# Patient Record
Sex: Female | Born: 1945 | Race: Black or African American | Hispanic: No | Marital: Married | Smoking: Former smoker
Health system: Southern US, Community
[De-identification: ages and names within clinical notes are randomized; demographics above are authoritative.]

## PROBLEM LIST (undated history)

## (undated) DIAGNOSIS — Z923 Personal history of irradiation: Secondary | ICD-10-CM

## (undated) DIAGNOSIS — Z9221 Personal history of antineoplastic chemotherapy: Secondary | ICD-10-CM

## (undated) DIAGNOSIS — E785 Hyperlipidemia, unspecified: Secondary | ICD-10-CM

## (undated) DIAGNOSIS — R7303 Prediabetes: Secondary | ICD-10-CM

## (undated) DIAGNOSIS — K635 Polyp of colon: Secondary | ICD-10-CM

## (undated) DIAGNOSIS — I10 Essential (primary) hypertension: Secondary | ICD-10-CM

## (undated) DIAGNOSIS — C801 Malignant (primary) neoplasm, unspecified: Secondary | ICD-10-CM

## (undated) HISTORY — PX: OOPHORECTOMY: SHX86

## (undated) HISTORY — PX: APPENDECTOMY: SHX54

## (undated) HISTORY — DX: Polyp of colon: K63.5

## (undated) HISTORY — DX: Hyperlipidemia, unspecified: E78.5

## (undated) HISTORY — PX: TUBAL LIGATION: SHX77

## (undated) HISTORY — DX: Essential (primary) hypertension: I10

## (undated) HISTORY — DX: Malignant (primary) neoplasm, unspecified: C80.1

## (undated) HISTORY — PX: CHOLECYSTECTOMY: SHX55

---

## 1996-11-22 HISTORY — PX: ABDOMINAL HYSTERECTOMY: SHX81

## 1999-11-23 DIAGNOSIS — C801 Malignant (primary) neoplasm, unspecified: Secondary | ICD-10-CM

## 1999-11-23 HISTORY — DX: Malignant (primary) neoplasm, unspecified: C80.1

## 1999-11-23 HISTORY — PX: MASTECTOMY: SHX3

## 2000-11-22 HISTORY — PX: BREAST SURGERY: SHX581

## 2004-08-27 ENCOUNTER — Ambulatory Visit: Payer: Self-pay | Admitting: Oncology

## 2004-09-22 ENCOUNTER — Ambulatory Visit: Payer: Self-pay | Admitting: Oncology

## 2005-02-25 ENCOUNTER — Ambulatory Visit: Payer: Self-pay | Admitting: Oncology

## 2005-03-22 ENCOUNTER — Ambulatory Visit: Payer: Self-pay | Admitting: Oncology

## 2005-08-11 ENCOUNTER — Ambulatory Visit: Payer: Self-pay

## 2005-08-26 ENCOUNTER — Ambulatory Visit: Payer: Self-pay | Admitting: Oncology

## 2005-09-22 ENCOUNTER — Ambulatory Visit: Payer: Self-pay | Admitting: Oncology

## 2006-02-24 ENCOUNTER — Ambulatory Visit: Payer: Self-pay | Admitting: Oncology

## 2006-03-22 ENCOUNTER — Ambulatory Visit: Payer: Self-pay | Admitting: Oncology

## 2006-08-03 ENCOUNTER — Ambulatory Visit: Payer: Self-pay

## 2006-08-23 ENCOUNTER — Ambulatory Visit: Payer: Self-pay | Admitting: Oncology

## 2006-09-22 ENCOUNTER — Ambulatory Visit: Payer: Self-pay | Admitting: Oncology

## 2007-02-14 ENCOUNTER — Ambulatory Visit: Payer: Self-pay | Admitting: Oncology

## 2007-02-14 ENCOUNTER — Ambulatory Visit: Payer: Self-pay | Admitting: Internal Medicine

## 2007-02-21 ENCOUNTER — Ambulatory Visit: Payer: Self-pay | Admitting: Oncology

## 2007-02-21 ENCOUNTER — Ambulatory Visit: Payer: Self-pay | Admitting: Internal Medicine

## 2007-07-24 ENCOUNTER — Ambulatory Visit: Payer: Self-pay | Admitting: Oncology

## 2007-08-15 ENCOUNTER — Ambulatory Visit: Payer: Self-pay | Admitting: Oncology

## 2007-08-23 ENCOUNTER — Ambulatory Visit: Payer: Self-pay | Admitting: Oncology

## 2007-10-11 ENCOUNTER — Ambulatory Visit: Payer: Self-pay

## 2008-01-21 ENCOUNTER — Ambulatory Visit: Payer: Self-pay | Admitting: Oncology

## 2008-02-21 ENCOUNTER — Ambulatory Visit: Payer: Self-pay | Admitting: Oncology

## 2008-08-27 ENCOUNTER — Ambulatory Visit: Payer: Self-pay

## 2008-09-03 ENCOUNTER — Ambulatory Visit: Payer: Self-pay | Admitting: Oncology

## 2008-09-22 ENCOUNTER — Ambulatory Visit: Payer: Self-pay | Admitting: Oncology

## 2009-02-20 ENCOUNTER — Ambulatory Visit: Payer: Self-pay | Admitting: Oncology

## 2009-03-04 ENCOUNTER — Ambulatory Visit: Payer: Self-pay | Admitting: Oncology

## 2009-03-22 ENCOUNTER — Ambulatory Visit: Payer: Self-pay | Admitting: Oncology

## 2009-08-22 ENCOUNTER — Ambulatory Visit: Payer: Self-pay | Admitting: Oncology

## 2009-08-27 ENCOUNTER — Ambulatory Visit: Payer: Self-pay

## 2009-09-02 ENCOUNTER — Ambulatory Visit: Payer: Self-pay | Admitting: Oncology

## 2009-09-22 ENCOUNTER — Ambulatory Visit: Payer: Self-pay | Admitting: Oncology

## 2010-02-07 ENCOUNTER — Emergency Department: Payer: Self-pay | Admitting: Emergency Medicine

## 2010-02-20 ENCOUNTER — Ambulatory Visit: Payer: Self-pay | Admitting: Oncology

## 2010-03-04 ENCOUNTER — Ambulatory Visit: Payer: Self-pay | Admitting: Oncology

## 2010-03-22 ENCOUNTER — Ambulatory Visit: Payer: Self-pay | Admitting: Oncology

## 2010-04-10 LAB — HM PAP SMEAR: HM Pap smear: NORMAL

## 2010-06-04 ENCOUNTER — Emergency Department: Payer: Self-pay | Admitting: Emergency Medicine

## 2010-09-02 ENCOUNTER — Ambulatory Visit: Payer: Self-pay | Admitting: Oncology

## 2010-09-08 ENCOUNTER — Ambulatory Visit: Payer: Self-pay | Admitting: Oncology

## 2010-09-22 ENCOUNTER — Ambulatory Visit: Payer: Self-pay | Admitting: Oncology

## 2011-03-10 ENCOUNTER — Ambulatory Visit: Payer: Self-pay | Admitting: Oncology

## 2011-03-23 ENCOUNTER — Ambulatory Visit: Payer: Self-pay | Admitting: Oncology

## 2011-07-28 ENCOUNTER — Emergency Department: Payer: Self-pay

## 2011-09-08 ENCOUNTER — Ambulatory Visit: Payer: Self-pay | Admitting: Oncology

## 2011-09-13 ENCOUNTER — Ambulatory Visit: Payer: Self-pay | Admitting: Oncology

## 2011-09-14 LAB — CANCER ANTIGEN 27.29: CA 27.29: 25.1 U/mL (ref 0.0–38.6)

## 2011-09-23 ENCOUNTER — Ambulatory Visit: Payer: Self-pay | Admitting: Oncology

## 2011-10-15 ENCOUNTER — Emergency Department: Payer: Self-pay | Admitting: Emergency Medicine

## 2011-10-17 ENCOUNTER — Emergency Department: Payer: Self-pay | Admitting: Emergency Medicine

## 2011-10-19 ENCOUNTER — Ambulatory Visit: Payer: Self-pay | Admitting: Anesthesiology

## 2011-10-19 DIAGNOSIS — I10 Essential (primary) hypertension: Secondary | ICD-10-CM

## 2011-10-25 ENCOUNTER — Ambulatory Visit: Payer: Self-pay | Admitting: Internal Medicine

## 2011-10-27 ENCOUNTER — Ambulatory Visit: Payer: Self-pay | Admitting: Otolaryngology

## 2011-10-29 LAB — PATHOLOGY REPORT

## 2011-11-05 ENCOUNTER — Ambulatory Visit: Payer: Self-pay | Admitting: Unknown Physician Specialty

## 2012-03-13 ENCOUNTER — Ambulatory Visit: Payer: Self-pay | Admitting: Oncology

## 2012-03-22 ENCOUNTER — Ambulatory Visit: Payer: Self-pay | Admitting: Oncology

## 2012-09-10 LAB — HM MAMMOGRAPHY: HM Mammogram: NORMAL

## 2012-09-11 ENCOUNTER — Ambulatory Visit: Payer: Self-pay

## 2012-11-04 ENCOUNTER — Emergency Department: Payer: Self-pay | Admitting: Emergency Medicine

## 2012-11-10 LAB — HM COLONOSCOPY

## 2013-04-10 ENCOUNTER — Encounter: Payer: Self-pay | Admitting: Internal Medicine

## 2013-04-10 ENCOUNTER — Ambulatory Visit (INDEPENDENT_AMBULATORY_CARE_PROVIDER_SITE_OTHER): Payer: Medicare Other | Admitting: Internal Medicine

## 2013-04-10 VITALS — BP 120/74 | HR 97 | Temp 98.1°F | Ht 65.25 in | Wt 198.0 lb

## 2013-04-10 DIAGNOSIS — Z853 Personal history of malignant neoplasm of breast: Secondary | ICD-10-CM | POA: Insufficient documentation

## 2013-04-10 DIAGNOSIS — E785 Hyperlipidemia, unspecified: Secondary | ICD-10-CM | POA: Insufficient documentation

## 2013-04-10 DIAGNOSIS — I10 Essential (primary) hypertension: Secondary | ICD-10-CM | POA: Insufficient documentation

## 2013-04-10 DIAGNOSIS — Z23 Encounter for immunization: Secondary | ICD-10-CM

## 2013-04-10 DIAGNOSIS — E039 Hypothyroidism, unspecified: Secondary | ICD-10-CM | POA: Insufficient documentation

## 2013-04-10 LAB — LIPID PANEL
HDL: 52.7 mg/dL (ref >39.00)
LDL Cholesterol: 81 mg/dL (ref 0–99)
Total CHOL/HDL Ratio: 3
Triglycerides: 100 mg/dL (ref 0.0–149.0)

## 2013-04-10 LAB — COMPREHENSIVE METABOLIC PANEL
ALT: 20 U/L (ref 0–35)
AST: 21 U/L (ref 0–37)
Albumin: 4.4 g/dL (ref 3.5–5.2)
Alkaline Phosphatase: 75 U/L (ref 39–117)
BUN: 23 mg/dL (ref 6–23)
Creatinine, Ser: 0.9 mg/dL (ref 0.4–1.2)
Potassium: 3.3 mEq/L — ABNORMAL LOW (ref 3.5–5.1)

## 2013-04-10 LAB — POCT URINALYSIS DIPSTICK
Glucose, UA: NEGATIVE
Nitrite, UA: NEGATIVE
Urobilinogen, UA: 0.2

## 2013-04-10 LAB — CBC WITH DIFFERENTIAL/PLATELET
Basophils Absolute: 0 10*3/uL (ref 0.0–0.1)
Basophils Relative: 0.6 % (ref 0.0–3.0)
Eosinophils Absolute: 0.2 10*3/uL (ref 0.0–0.7)
Lymphocytes Relative: 37.6 % (ref 12.0–46.0)
MCHC: 33.7 g/dL (ref 30.0–36.0)
Monocytes Relative: 6.1 % (ref 3.0–12.0)
Neutrophils Relative %: 53.6 % (ref 43.0–77.0)
RBC: 5.09 Mil/uL (ref 3.87–5.11)

## 2013-04-10 LAB — TSH: TSH: 3.29 u[IU]/mL (ref 0.35–5.50)

## 2013-04-10 NOTE — Progress Notes (Signed)
Subjective:    Patient ID: Michelle Clark, female    DOB: 09/22/1946, 67 y.o.   MRN: [id number]  HPI 67 year old female with history of hypertension, hyperlipidemia, breast cancer status post mastectomy, chemotherapy, and radiation therapy presents to establish care. She reports she was diagnosed with right-sided breast cancer in 2001. She was treated with chemotherapy and radiation as well as mastectomy. She has been cancer free since that time. She is generally done well. She also has a history of hypertension for which she takes Maxzide and furosemide with improvement. She was recently seen by a cardiologist and underwent stress test which she reports was normal. She also has a history of high cholesterol for which he takes simvastatin. She denies any side effects from this medication.  Outpatient Encounter Prescriptions as of 04/10/2013  Medication Sig Dispense Refill  . aspirin 81 MG tablet Take 81 mg by mouth daily.      . furosemide (LASIX) 40 MG tablet Take 40 mg by mouth daily as needed.       . Multiple Vitamin (MULTIVITAMIN) tablet Take 1 tablet by mouth daily.      . naproxen sodium (ANAPROX) 220 MG tablet Take 220 mg by mouth 2 (two) times daily with a meal.      . simvastatin (ZOCOR) 20 MG tablet Take 20 mg by mouth daily.       Marland Kitchen triamterene-hydrochlorothiazide (MAXZIDE) 75-50 MG per tablet Take 1 tablet by mouth daily.       No facility-administered encounter medications on file as of 04/10/2013.   BP 120/74  Pulse 97  Temp(Src) 98.1 F (36.7 C) (Oral)  Ht 5' 5.25" (1.657 m)  Wt 198 lb (89.812 kg)  BMI 32.71 kg/m2  SpO2 98%  Review of Systems  Constitutional: Negative for fever, chills, appetite change, fatigue and unexpected weight change.  HENT: Negative for ear pain, congestion, sore throat, trouble swallowing, neck pain, voice change and sinus pressure.   Eyes: Negative for visual disturbance.  Respiratory: Negative for cough, shortness of breath, wheezing and stridor.    Cardiovascular: Negative for chest pain, palpitations and leg swelling.  Gastrointestinal: Negative for nausea, vomiting, abdominal pain, diarrhea, constipation, blood in stool, abdominal distention and anal bleeding.  Genitourinary: Negative for dysuria and flank pain.  Musculoskeletal: Negative for myalgias, arthralgias and gait problem.  Skin: Negative for color change and rash.  Neurological: Negative for dizziness and headaches.  Hematological: Negative for adenopathy. Does not bruise/bleed easily.  Psychiatric/Behavioral: Negative for suicidal ideas, sleep disturbance and dysphoric mood. The patient is not nervous/anxious.        Objective:   Physical Exam  Constitutional: She is oriented to person, place, and time. She appears well-developed and well-nourished. No distress.  HENT:  Head: Normocephalic and atraumatic.  Right Ear: External ear normal.  Left Ear: External ear normal.  Nose: Nose normal.  Mouth/Throat: Oropharynx is clear and moist. No oropharyngeal exudate.  Eyes: Conjunctivae are normal. Pupils are equal, round, and reactive to light. Right eye exhibits no discharge. Left eye exhibits no discharge. No scleral icterus.  Neck: Normal range of motion. Neck supple. No tracheal deviation present. No thyromegaly present.  Cardiovascular: Normal rate, regular rhythm, normal heart sounds and intact distal pulses.  Exam reveals no gallop and no friction rub.   No murmur heard. Pulmonary/Chest: Effort normal and breath sounds normal. No respiratory distress. She has no wheezes. She has no rales. She exhibits no tenderness.  Abdominal: Soft. Bowel sounds are normal. She exhibits no  distension. There is no tenderness.  Musculoskeletal: Normal range of motion. She exhibits no edema and no tenderness.  Lymphadenopathy:    She has no cervical adenopathy.  Neurological: She is alert and oriented to person, place, and time. No cranial nerve deficit. She exhibits normal muscle  tone. Coordination normal.  Skin: Skin is warm and dry. No rash noted. She is not diaphoretic. No erythema. No pallor.  Psychiatric: She has a normal mood and affect. Her behavior is normal. Judgment and thought content normal.          Assessment & Plan:

## 2013-04-10 NOTE — Assessment & Plan Note (Signed)
BP Readings from Last 3 Encounters:  04/10/13 120/74   BP well controlled on current medications. Will continue.

## 2013-04-10 NOTE — Assessment & Plan Note (Signed)
Lipids well controlled on Simvastatin. Will continue. 

## 2013-04-10 NOTE — Assessment & Plan Note (Signed)
Will request records on previous breast cancer from [facility].

## 2013-08-31 ENCOUNTER — Telehealth: Payer: Self-pay | Admitting: *Deleted

## 2013-08-31 DIAGNOSIS — Z1239 Encounter for other screening for malignant neoplasm of breast: Secondary | ICD-10-CM

## 2013-08-31 NOTE — Telephone Encounter (Signed)
Patient left a message requesting we call to schedule her mammogram. Called patient and informed her now she could call to schedule her own mammogram. However patient is a previous cancer patient, she has had breast cancer and her last mammogram was 08/2012. Could you please set this up for her, she uses [facility].

## 2013-09-03 NOTE — Telephone Encounter (Signed)
Dr. Dan Humphreys, will you place a order for this mammogram.

## 2013-09-13 ENCOUNTER — Ambulatory Visit: Payer: Self-pay | Admitting: Internal Medicine

## 2013-09-13 LAB — HM MAMMOGRAPHY: HM Mammogram: NORMAL

## 2013-09-27 ENCOUNTER — Other Ambulatory Visit: Payer: Self-pay

## 2013-10-02 ENCOUNTER — Encounter: Payer: Self-pay | Admitting: Internal Medicine

## 2013-10-15 ENCOUNTER — Encounter: Payer: Self-pay | Admitting: Internal Medicine

## 2013-10-15 ENCOUNTER — Ambulatory Visit (INDEPENDENT_AMBULATORY_CARE_PROVIDER_SITE_OTHER): Payer: Medicare HMO | Admitting: Internal Medicine

## 2013-10-15 VITALS — BP 128/78 | HR 97 | Temp 97.7°F | Resp 12 | Ht 66.5 in | Wt 201.0 lb

## 2013-10-15 DIAGNOSIS — Z23 Encounter for immunization: Secondary | ICD-10-CM

## 2013-10-15 DIAGNOSIS — Z0000 Encounter for general adult medical examination without abnormal findings: Secondary | ICD-10-CM

## 2013-10-15 DIAGNOSIS — Z01419 Encounter for gynecological examination (general) (routine) without abnormal findings: Secondary | ICD-10-CM

## 2013-10-15 DIAGNOSIS — I10 Essential (primary) hypertension: Secondary | ICD-10-CM

## 2013-10-15 DIAGNOSIS — E785 Hyperlipidemia, unspecified: Secondary | ICD-10-CM

## 2013-10-15 DIAGNOSIS — Z780 Asymptomatic menopausal state: Secondary | ICD-10-CM | POA: Insufficient documentation

## 2013-10-15 LAB — COMPREHENSIVE METABOLIC PANEL
ALT: 22 U/L (ref 0–35)
AST: 26 U/L (ref 0–37)
Albumin: 4.4 g/dL (ref 3.5–5.2)
Alkaline Phosphatase: 77 U/L (ref 39–117)
Potassium: 3.3 mEq/L — ABNORMAL LOW (ref 3.5–5.1)
Sodium: 135 mEq/L (ref 135–145)
Total Protein: 8.1 g/dL (ref 6.0–8.3)

## 2013-10-15 MED ORDER — TRIAMTERENE-HCTZ 75-50 MG PO TABS
1.0000 | ORAL_TABLET | Freq: Every day | ORAL | Status: DC
Start: 2013-10-15 — End: 2014-10-08

## 2013-10-15 NOTE — Assessment & Plan Note (Signed)
General medical exam normal today including breast and pelvic exam. PAP pending. Mammogram UTD. Will order bone density testing. Will check labs today including CMP, lipids. Flu vaccine today. Encouraged healthy diet and regular exercise.

## 2013-10-15 NOTE — Assessment & Plan Note (Signed)
Pt at risk for osteoporosis. Will set up bone density testing.

## 2013-10-15 NOTE — Progress Notes (Signed)
Subjective:    Patient ID: Michelle Clark, female    DOB: 11-Jan-1946, 67 y.o.   MRN: [id number]  HPI The patient is here for annual Medicare wellness examination and management of other chronic and acute problems.   The risk factors are reflected in the social history.  The roster of all physicians providing medical care to patient - is listed in the Snapshot section of the chart.  Activities of daily living:  The patient is 100% independent in all ADLs: dressing, toileting, feeding as well as independent mobility. Lives with husband, and she provides care for him. No pets in home.  Home safety : The patient has smoke detectors in the home. They wear seatbelts.  There are no firearms at home. There is no violence in the home.   There is no risks for hepatitis, STDs or HIV. There is no history of blood transfusion. They have no travel history to infectious disease endemic areas of the world.  The patient has not seen their dentist in the last six month. She does not have a dentist. They have seen their eye doctor in the last year. Othalmology - Dr. Alvester Morin  They have deferred audiologic testing in the last year.  They do not  have excessive sun exposure. Discussed the need for sun protection: hats, long sleeves and use of sunscreen if there is significant sun exposure.  Cardiology - Dr. Juliann Pares  Diet: the importance of a healthy diet is discussed. They do have a healthy diet.  The benefits of regular aerobic exercise were discussed. She has not been exercise.  Depression screen: there are no signs or vegative symptoms of depression- irritability, change in appetite, anhedonia, sadness/tearfullness.  No recent falls.  Cognitive assessment: the patient manages all their financial and personal affairs and is actively engaged. They could relate day,date,year and events.  HCPOA - none in place.  The following portions of the patient's history were reviewed and updated as appropriate:  allergies, current medications, past family history, past medical history,  past surgical history, past social history  and problem list.  Visual acuity was not assessed per patient preference since she has regular follow up with her ophthalmologist. Hearing and body mass index were assessed and reviewed.   During the course of the visit the patient was educated and counseled about appropriate screening and preventive services including : fall prevention , diabetes screening, nutrition counseling, colorectal cancer screening, and recommended immunizations.      Review of Systems  Constitutional: Negative for fever, chills, appetite change, fatigue and unexpected weight change.  HENT: Negative for congestion, ear pain, sinus pressure, sore throat, trouble swallowing and voice change.   Eyes: Negative for visual disturbance.  Respiratory: Negative for cough, shortness of breath, wheezing and stridor.   Cardiovascular: Negative for chest pain, palpitations and leg swelling.  Gastrointestinal: Negative for nausea, vomiting, abdominal pain, diarrhea, constipation, blood in stool, abdominal distention and anal bleeding.  Genitourinary: Negative for dysuria and flank pain.  Musculoskeletal: Negative for arthralgias, gait problem, myalgias and neck pain.  Skin: Negative for color change and rash.  Neurological: Negative for dizziness and headaches.  Hematological: Negative for adenopathy. Does not bruise/bleed easily.  Psychiatric/Behavioral: Negative for suicidal ideas, sleep disturbance and dysphoric mood. The patient is not nervous/anxious.    BP 128/78  Pulse 97  Temp(Src) 97.7 F (36.5 C) (Oral)  Resp 12  Ht 5' 6.5" (1.689 m)  Wt 201 lb (91.173 kg)  BMI 31.96 kg/m2  SpO2 97%  Objective:   Physical Exam  Constitutional: She is oriented to person, place, and time. She appears well-developed and well-nourished. No distress.  HENT:  Head: Normocephalic and atraumatic.  Right Ear:  External ear normal.  Left Ear: External ear normal.  Nose: Nose normal.  Mouth/Throat: Oropharynx is clear and moist. No oropharyngeal exudate.  Eyes: Conjunctivae are normal. Pupils are equal, round, and reactive to light. Right eye exhibits no discharge. Left eye exhibits no discharge. No scleral icterus.  Neck: Normal range of motion. Neck supple. No tracheal deviation present. No thyromegaly present.  Cardiovascular: Normal rate, regular rhythm, normal heart sounds and intact distal pulses.  Exam reveals no gallop and no friction rub.   No murmur heard. Pulmonary/Chest: Effort normal and breath sounds normal. No respiratory distress. She has no wheezes. She has no rales. She exhibits no tenderness. Left breast exhibits no inverted nipple, no mass, no nipple discharge, no skin change and no tenderness.    Abdominal: Soft. Bowel sounds are normal. She exhibits no distension and no mass. There is no tenderness. There is no rebound and no guarding.  Genitourinary: Rectum normal. No breast swelling, tenderness, discharge or bleeding. Pelvic exam was performed with patient supine. There is no rash, tenderness or lesion on the right labia. There is no rash, tenderness or lesion on the left labia. Uterus is not tender. Right adnexum displays no mass, no tenderness and no fullness. Left adnexum displays no mass, no tenderness and no fullness. There is erythema (consistent with atrophic vaginitis) around the vagina. No tenderness around the vagina. No vaginal discharge found.  Uterus surgically absent. Vaginal cuff present.  Musculoskeletal: Normal range of motion. She exhibits no edema and no tenderness.  Lymphadenopathy:    She has no cervical adenopathy.  Neurological: She is alert and oriented to person, place, and time. No cranial nerve deficit. She exhibits normal muscle tone. Coordination normal.  Skin: Skin is warm and dry. No rash noted. She is not diaphoretic. No erythema. No pallor.    Psychiatric: She has a normal mood and affect. Her behavior is normal. Judgment and thought content normal.          Assessment & Plan:

## 2013-10-15 NOTE — Progress Notes (Signed)
Pre visit review using our clinic review tool, if applicable. No additional management support is needed unless otherwise documented below in the visit note. 

## 2013-10-15 NOTE — Patient Instructions (Signed)
Hypertension Hypertension is another name for high blood pressure. High blood pressure may mean that your heart needs to work harder to pump blood. Blood pressure consists of two numbers, which includes a higher number over a lower number (example: 110/72). HOME CARE   Make lifestyle changes as told by your doctor. This may include weight loss and exercise.  Take your blood pressure medicine every day.  Limit how much salt you use.  Stop smoking if you smoke.  Do not use drugs.  Talk to your doctor if you are using decongestants or birth control pills. These medicines might make blood pressure higher.  Females should not drink more than 1 alcoholic drink per day. Males should not drink more than 2 alcoholic drinks per day.  See your doctor as told. GET HELP RIGHT AWAY IF:   You have a blood pressure reading with a top number of 180 or higher.  You get a very bad headache.  You get blurred or changing vision.  You feel confused.  You feel weak, numb, or faint.  You get chest or belly (abdominal) pain.  You throw up (vomit).  You cannot breathe very well. MAKE SURE YOU:   Understand these instructions.  Will watch your condition.  Will get help right away if you are not doing well or get worse. Document Released: 04/26/2008 Document Revised: 01/31/2012 Document Reviewed: 04/26/2008 [employer] Patient Information 2014 [employer].  

## 2013-10-16 ENCOUNTER — Other Ambulatory Visit (HOSPITAL_COMMUNITY)
Admission: RE | Admit: 2013-10-16 | Discharge: 2013-10-16 | Disposition: A | Discharge status: Home / Self Care | Payer: Medicare HMO | Source: Ambulatory Visit | Attending: Internal Medicine | Admitting: Internal Medicine

## 2013-10-16 DIAGNOSIS — Z1151 Encounter for screening for human papillomavirus (HPV): Secondary | ICD-10-CM | POA: Insufficient documentation

## 2013-10-16 DIAGNOSIS — Z01419 Encounter for gynecological examination (general) (routine) without abnormal findings: Secondary | ICD-10-CM | POA: Insufficient documentation

## 2013-10-16 LAB — HM PAP SMEAR: HM Pap smear: NEGATIVE

## 2013-10-16 NOTE — Addendum Note (Signed)
Addended by: Montine Circle D on: 10/16/2013 10:48 AM   Modules accepted: Orders

## 2013-11-09 ENCOUNTER — Other Ambulatory Visit: Payer: Self-pay | Admitting: *Deleted

## 2013-11-09 MED ORDER — SIMVASTATIN 20 MG PO TABS: 20.0000 mg | ORAL_TABLET | Freq: Every day | ORAL | Status: DC

## 2014-03-02 DIAGNOSIS — E876 Hypokalemia: Secondary | ICD-10-CM | POA: Insufficient documentation

## 2014-03-02 DIAGNOSIS — R000 Tachycardia, unspecified: Secondary | ICD-10-CM | POA: Insufficient documentation

## 2014-04-19 ENCOUNTER — Ambulatory Visit (INDEPENDENT_AMBULATORY_CARE_PROVIDER_SITE_OTHER): Payer: Medicare HMO | Admitting: Internal Medicine

## 2014-04-19 ENCOUNTER — Encounter: Payer: Self-pay | Admitting: Internal Medicine

## 2014-04-19 VITALS — BP 136/88 | HR 98 | Temp 98.1°F | Ht 66.5 in | Wt 204.2 lb

## 2014-04-19 DIAGNOSIS — M542 Cervicalgia: Secondary | ICD-10-CM

## 2014-04-19 DIAGNOSIS — I10 Essential (primary) hypertension: Secondary | ICD-10-CM

## 2014-04-19 DIAGNOSIS — E785 Hyperlipidemia, unspecified: Secondary | ICD-10-CM

## 2014-04-19 DIAGNOSIS — Z23 Encounter for immunization: Secondary | ICD-10-CM | POA: Insufficient documentation

## 2014-04-19 DIAGNOSIS — M722 Plantar fascial fibromatosis: Secondary | ICD-10-CM

## 2014-04-19 LAB — COMPREHENSIVE METABOLIC PANEL
ALBUMIN: 4.8 g/dL (ref 3.5–5.2)
ALT: 26 U/L (ref 0–35)
AST: 28 U/L (ref 0–37)
Alkaline Phosphatase: 73 U/L (ref 39–117)
BUN: 17 mg/dL (ref 6–23)
CO2: 27 mEq/L (ref 19–32)
Calcium: 10.2 mg/dL (ref 8.4–10.5)
Chloride: 99 mEq/L (ref 96–112)
Creatinine, Ser: 0.9 mg/dL (ref 0.4–1.2)
GFR: 82.95 mL/min (ref >60.00)
GLUCOSE: 94 mg/dL (ref 70–99)
POTASSIUM: 3.6 meq/L (ref 3.5–5.1)
SODIUM: 137 meq/L (ref 135–145)
TOTAL PROTEIN: 8.3 g/dL (ref 6.0–8.3)
Total Bilirubin: 0.6 mg/dL (ref 0.2–1.2)

## 2014-04-19 LAB — LIPID PANEL
CHOLESTEROL: 153 mg/dL (ref 0–200)
HDL: 56.3 mg/dL (ref >39.00)
LDL Cholesterol: 74 mg/dL (ref 0–99)
Total CHOL/HDL Ratio: 3
Triglycerides: 113 mg/dL (ref 0.0–149.0)
VLDL: 22.6 mg/dL (ref 0.0–40.0)

## 2014-04-19 MED ORDER — FUROSEMIDE 40 MG PO TABS: 40.0000 mg | ORAL_TABLET | Freq: Every day | ORAL | Status: DC | PRN

## 2014-04-19 MED ORDER — CYCLOBENZAPRINE HCL 5 MG PO TABS: 5.0000 mg | ORAL_TABLET | Freq: Every evening | ORAL | Status: DC | PRN

## 2014-04-19 NOTE — Progress Notes (Signed)
Subjective:    Patient ID: Michelle Clark, female    DOB: [birth year], 68 y.o.   MRN: [id number]  HPI 68YO female presents for follow up.  Left heel pain - Sharp pain when initially standing. Then, improves some with walking. Particularly bad at end of day. Taking Aleve with minimal improvement.  Left neck pain - Aching pain in left neck and shoulder. Worse after working all day. Minimal improvement with Aleve. No weakness left arm. No h/o trauma to her shoulder or neck.  Review of Systems  Constitutional: Negative for fever, chills, appetite change, fatigue and unexpected weight change.  HENT: Negative for congestion, ear pain, sinus pressure, sore throat, trouble swallowing and voice change.   Eyes: Negative for visual disturbance.  Respiratory: Negative for cough, shortness of breath, wheezing and stridor.   Cardiovascular: Negative for chest pain, palpitations and leg swelling.  Gastrointestinal: Negative for nausea, vomiting, abdominal pain, diarrhea, constipation, blood in stool, abdominal distention and anal bleeding.  Genitourinary: Negative for dysuria and flank pain.  Musculoskeletal: Positive for arthralgias, myalgias and neck pain. Negative for gait problem.  Skin: Negative for color change and rash.  Neurological: Negative for dizziness and headaches.  Hematological: Negative for adenopathy. Does not bruise/bleed easily.  Psychiatric/Behavioral: Negative for suicidal ideas, sleep disturbance and dysphoric mood. The patient is not nervous/anxious.        Objective:    BP 136/88  Pulse 98  Temp(Src) 98.1 F (36.7 C) (Oral)  Ht 5' 6.5" (1.689 m)  Wt 204 lb 4 oz (92.647 kg)  BMI 32.48 kg/m2  SpO2 99% Physical Exam  Constitutional: She is oriented to person, place, and time. She appears well-developed and well-nourished. No distress.  HENT:  Head: Normocephalic and atraumatic.  Right Ear: External ear normal.  Left Ear: External ear normal.  Nose: Nose normal.    Mouth/Throat: Oropharynx is clear and moist. No oropharyngeal exudate.  Eyes: Conjunctivae are normal. Pupils are equal, round, and reactive to light. Right eye exhibits no discharge. Left eye exhibits no discharge. No scleral icterus.  Neck: Normal range of motion. Neck supple. No tracheal deviation present. No thyromegaly present.  Cardiovascular: Normal rate, regular rhythm, normal heart sounds and intact distal pulses.  Exam reveals no gallop and no friction rub.   No murmur heard. Pulmonary/Chest: Effort normal and breath sounds normal. No accessory muscle usage. Not tachypneic. No respiratory distress. She has no decreased breath sounds. She has no wheezes. She has no rhonchi. She has no rales. She exhibits no tenderness.  Musculoskeletal: Normal range of motion. She exhibits no edema.       Cervical back: She exhibits tenderness, pain and spasm. She exhibits normal range of motion.       Back:       Left foot: She exhibits tenderness. She exhibits normal range of motion.       Feet:  Lymphadenopathy:    She has no cervical adenopathy.  Neurological: She is alert and oriented to person, place, and time. No cranial nerve deficit. She exhibits normal muscle tone. Coordination normal.  Skin: Skin is warm and dry. No rash noted. She is not diaphoretic. No erythema. No pallor.  Psychiatric: She has a normal mood and affect. Her behavior is normal. Judgment and thought content normal.          Assessment & Plan:   Problem List Items Addressed This Visit     Unprioritized   Essential hypertension, benign - Primary  BP Readings from Last 3 Encounters:  04/19/14 136/88  10/15/13 128/78  04/10/13 120/74   BP well controlled on current medication. Will check renal function with labs.    Relevant Medications      furosemide (LASIX) tablet   Other Relevant Orders      Comprehensive metabolic panel   Neck pain on left side     Left sided neck pain secondary to spasm of  trapezius muscle likely related to tension and overuse at work. Will continue Aleve. Start Flexeril prn at night. Recommended icing neck and shoulder and massage therapy. Follow up prn.    Relevant Medications      cyclobenzaprine (FLEXERIL) tablet   Need for prophylactic vaccination against Streptococcus pneumoniae (pneumococcus)   Relevant Orders      Pneumococcal conjugate vaccine 13-valent (Completed)   Other and unspecified hyperlipidemia     Will check lipids and LFTs with labs. Continue Simvastatin.    Relevant Medications      furosemide (LASIX) tablet   Other Relevant Orders      Lipid panel   Plantar fasciitis     Symptoms most consistent with plantar fasciitis. Recommend supportive footwear. Recommended icing area by rolling on frozen water bottle at night. Discussed possible referral to podiatry, but she would like to hold off on this for now.        Return in about 6 months (around 10/20/2014) for Physical.

## 2014-04-19 NOTE — Progress Notes (Signed)
Pre visit review using our clinic review tool, if applicable. No additional management support is needed unless otherwise documented below in the visit note. 

## 2014-04-19 NOTE — Assessment & Plan Note (Signed)
BP Readings from Last 3 Encounters:  04/19/14 136/88  10/15/13 128/78  04/10/13 120/74   BP well controlled on current medication. Will check renal function with labs.

## 2014-04-19 NOTE — Patient Instructions (Addendum)
Start Flexeril 5mg  at bedtime to help with neck pain. Call if symptoms are not improving.  Plantar Fasciitis Plantar fasciitis is a common condition that causes foot pain. It is soreness (inflammation) of the band of tough fibrous tissue on the bottom of the foot that runs from the heel bone (calcaneus) to the ball of the foot. The cause of this soreness may be from excessive standing, poor fitting shoes, running on hard surfaces, being overweight, having an abnormal walk, or overuse (this is common in runners) of the painful foot or feet. It is also common in aerobic exercise dancers and ballet dancers. SYMPTOMS  Most people with plantar fasciitis complain of:  Severe pain in the morning on the bottom of their foot especially when taking the first steps out of bed. This pain recedes after a few minutes of walking.  Severe pain is experienced also during walking following a long period of inactivity.  Pain is worse when walking barefoot or up stairs DIAGNOSIS   Your caregiver will diagnose this condition by examining and feeling your foot.  Special tests such as X-rays of your foot, are usually not needed. PREVENTION   Consult a sports medicine professional before beginning a new exercise program.  Walking programs offer a good workout. With walking there is a lower chance of overuse injuries common to runners. There is less impact and less jarring of the joints.  Begin all new exercise programs slowly. If problems or pain develop, decrease the amount of time or distance until you are at a comfortable level.  Wear good shoes and replace them regularly.  Stretch your foot and the heel cords at the back of the ankle (Achilles tendon) both before and after exercise.  Run or exercise on even surfaces that are not hard. For example, asphalt is better than pavement.  Do not run barefoot on hard surfaces.  If using a treadmill, vary the incline.  Do not continue to workout if you have  foot or joint problems. Seek professional help if they do not improve. HOME CARE INSTRUCTIONS   Avoid activities that cause you pain until you recover.  Use ice or cold packs on the problem or painful areas after working out.  Only take over-the-counter or prescription medicines for pain, discomfort, or fever as directed by your caregiver.  Soft shoe inserts or athletic shoes with air or gel sole cushions may be helpful.  If problems continue or become more severe, consult a sports medicine caregiver or your own health care provider. Cortisone is a potent anti-inflammatory medication that may be injected into the painful area. You can discuss this treatment with your caregiver. MAKE SURE YOU:   Understand these instructions.  Will watch your condition.  Will get help right away if you are not doing well or get worse. Document Released: 08/03/2001 Document Revised: 01/31/2012 Document Reviewed: 10/02/2008 [facility] Patient Information 2014 [place], [place].

## 2014-04-19 NOTE — Assessment & Plan Note (Signed)
Will check lipids and LFTs with labs. Continue Simvastatin. 

## 2014-04-19 NOTE — Assessment & Plan Note (Signed)
Left sided neck pain secondary to spasm of trapezius muscle likely related to tension and overuse at work. Will continue Aleve. Start Flexeril prn at night. Recommended icing neck and shoulder and massage therapy. Follow up prn.

## 2014-04-19 NOTE — Assessment & Plan Note (Signed)
Symptoms most consistent with plantar fasciitis. Recommend supportive footwear. Recommended icing area by rolling on frozen water bottle at night. Discussed possible referral to podiatry, but she would like to hold off on this for now.

## 2014-04-20 ENCOUNTER — Telehealth: Payer: Self-pay | Admitting: Internal Medicine

## 2014-04-20 NOTE — Telephone Encounter (Signed)
Relevant patient education mailed to patient.  

## 2014-05-09 ENCOUNTER — Encounter: Payer: Self-pay | Admitting: Internal Medicine

## 2014-05-09 ENCOUNTER — Ambulatory Visit (INDEPENDENT_AMBULATORY_CARE_PROVIDER_SITE_OTHER): Payer: Medicare HMO | Admitting: Internal Medicine

## 2014-05-09 VITALS — BP 138/84 | HR 95 | Temp 98.1°F | Wt 204.5 lb

## 2014-05-09 DIAGNOSIS — IMO0001 Reserved for inherently not codable concepts without codable children: Secondary | ICD-10-CM

## 2014-05-09 DIAGNOSIS — R3915 Urgency of urination: Secondary | ICD-10-CM

## 2014-05-09 DIAGNOSIS — L293 Anogenital pruritus, unspecified: Secondary | ICD-10-CM

## 2014-05-09 DIAGNOSIS — N898 Other specified noninflammatory disorders of vagina: Secondary | ICD-10-CM

## 2014-05-09 DIAGNOSIS — R350 Frequency of micturition: Secondary | ICD-10-CM

## 2014-05-09 LAB — POCT URINALYSIS DIPSTICK
Bilirubin, UA: NEGATIVE
Blood, UA: NEGATIVE
GLUCOSE UA: NEGATIVE
KETONES UA: NEGATIVE
LEUKOCYTES UA: NEGATIVE
Nitrite, UA: NEGATIVE
Protein, UA: NEGATIVE
Urobilinogen, UA: 0.2
pH, UA: 7.5

## 2014-05-09 NOTE — Progress Notes (Signed)
HPI  Pt presents to the clinic today with c/o urgency, frequency and vaginal itching. She reports this started yesterday. She denies dysuria or vaginal discharge. She denies fever, chills, nausea or pelvic pain. She does feel like her symptoms are much improved today.   Review of Systems  Past Medical History  Diagnosis Date  . Hyperlipidemia   . Hypertension   . Colon polyps   . Cancer 2001    Right breast, s/p chemotherapy, XRT and mastectomy, Dr. Jeb Levering    Family History  Problem Relation Age of Onset  . Arthritis Mother   . Arthritis Sister   . Stroke Sister     paralyzed left side  . Cancer Sister     breast  . Heart disease Brother   . Asthma Brother     History   Social History  . Marital Status: Married    Spouse Name: N/A    Number of Children: N/A  . Years of Education: N/A   Occupational History  . Not on file.   Social History Main Topics  . Smoking status: Former Smoker    Quit date: 04/10/1997  . Smokeless tobacco: Never Used  . Alcohol Use: 0.6 oz/week    1 Glasses of wine per week  . Drug Use: No  . Sexual Activity: Not on file   Other Topics Concern  . Not on file   Social History Narrative   Lives in [place] with husband. No pets      Work - retired, Surveyor, minerals      Diet - regular      Exercise - none    No Known Allergies  Constitutional: Denies fever, malaise, fatigue, headache or abrupt weight changes.   GU: Pt reports urgency, frequency and vaginal itching. Denies burning sensation, blood in urine, odor or discharge. Skin: Denies redness, rashes, lesions or ulcercations.   No other specific complaints in a complete review of systems (except as listed in HPI above).    Objective:   Physical Exam  BP 138/84  Pulse 95  Temp(Src) 98.1 F (36.7 C) (Oral)  Wt 204 lb 8 oz (92.761 kg)  SpO2 98% Wt Readings from Last 3 Encounters:  05/09/14 204 lb 8 oz (92.761 kg)  04/19/14 204 lb 4 oz (92.647 kg)  10/15/13 201 lb  (91.173 kg)    General: Appears her stated age, well developed, well nourished in NAD. Cardiovascular: Normal rate and rhythm. S1,S2 noted.  No murmur, rubs or gallops noted. No JVD or BLE edema. No carotid bruits noted. Pulmonary/Chest: Normal effort and positive vesicular breath sounds. No respiratory distress. No wheezes, rales or ronchi noted.  Abdomen: Soft and nontender. Normal bowel sounds, no bruits noted. No distention or masses noted. Liver, spleen and kidneys non palpable. Tender to palpation over the bladder area. No CVA tenderness.      Assessment & Plan:   Urgency, Frequency, Vaginal itching, resolved.   Urinalysis: normal Drink plenty of fluids Continue to monitor for reoccurring symptoms  RTC as needed or if symptoms persist.

## 2014-05-09 NOTE — Patient Instructions (Addendum)
Urinary Frequency The number of times a normal person urinates depends upon how much liquid they take in and how much liquid they are losing. If the temperature is hot and there is high humidity then the person will sweat more and usually breathe a little more frequently. These factors decrease the amount of frequency of urination that would be considered normal. The amount you drink is easily determined, but the amount of fluid lost is sometimes more difficult to calculate.  Fluid is lost in two ways:  Sensible fluid loss is usually measured by the amount of urine that you get rid of. Losses of fluid can also occur with diarrhea.  Insensible fluid loss is more difficult to measure. It is caused by evaporation. Insensible loss of fluid occurs through breathing and sweating. It usually ranges from a little less than a quart to a little more than a quart of fluid a day. In normal temperatures and activity levels the average person may urinate 4 to 7 times in a 24-hour period. Needing to urinate more often than that could indicate a problem. If one urinates 4 to 7 times in 24 hours and has large volumes each time, that could indicate a different problem from one who urinates 4 to 7 times a day and has small volumes. The time of urinating is also an important. Most urinating should be done during the waking hours. Getting up at night to urinate frequently can indicate some problems. CAUSES  The bladder is the organ in your lower abdomen that holds urine. Like a balloon, it swells some as it fills up. Your nerves sense this and tell you it is time to head for the bathroom. There are a number of reasons that you might feel the need to urinate more often than usual. They include:  Urinary tract infection. This is usually associated with other signs such as burning when you urinate.  In men, problems with the prostate (a walnut-size gland that is located near the tube that carries urine out of your body).  There are two reasons why the prostate can cause an increased frequency of urination:  An enlarged prostate that does not let the bladder empty well. If the bladder only half empties when you urinate then it only has half the capacity to fill before you have to urinate again.  The nerves in the bladder become more hypersensitive with an increased size of the prostate even if the bladder empties completely.  Pregnancy.  Obesity. Excess weight is more likely to cause a problem for women more than for men.  Bladder stones or other bladder problems.  Caffeine.  Alcohol.  Medications. For example, drugs that help the body get rid of extra fluid (diuretics) increase urine production. Some other medicines must be taken with lots of fluids.  Muscle or nerve weakness. This might be the result of a spinal cord injury, a stroke, multiple sclerosis or Parkinson's disease.  Long-standing diabetes can decrease the sensation of the bladder. This loss of sensation makes it harder to sense the bladder needs to be emptied. Over a period of years the bladder is stretched out by constant overfilling. This weakens the bladder muscles so that the bladder does not empty well and has less capacity to fill with new urine.  Interstitial cystitis (also called painful bladder syndrome). This condition develops because the tissues that line the insider of the bladder are inflamed (inflammation is the body's way of reacting to injury or infection). It causes pain  and frequent urination. It occurs in women more often than in men. DIAGNOSIS   To decide what might be causing your urinary frequency, your healthcare provider will probably:  Ask about symptoms you have noticed.  Ask about your overall health. This will include questions about any medications you are taking.  Do a physical examination.  Order some tests. These might include:  A blood test to check for diabetes or other health issues that could be  contributing to the problem.  Urine testing. This could measure the flow of urine and the pressure on the bladder.  A test of your neurological system (the brain, spinal cord and nerves). This is the system that senses the need to urinate.  A bladder test to check whether it is emptying completely when you urinate.  Cytoscopy. This test uses a thin tube with a tiny camera on it. It offers a look inside your urethra and bladder to see if there are problems.  Imaging tests. You might be given a contrast dye and then asked to urinate. X-rays are taken to see how your bladder is working. TREATMENT  It is important for you to be evaluated to determine if the amount or frequency that you have is unusual or abnormal. If it is found to be abnormal the cause should be determined and this can usually be found out easily. Depending upon the cause treatment could include medication, stimulation of the nerves, or surgery. There are not too many things that you can do as an individual to change your urinary frequency. It is important that you balance the amount of fluid intake needed to compensate for your activity and the temperature. Medical problems will be diagnosed and taken care of by your physician. There is no particular bladder training such as Kegel's exercises that you can do to help urinary frequency. This is an exercise this is usually done for people who have leaking of urine when they laugh cough or sneeze. HOME CARE INSTRUCTIONS   Take any medications your healthcare provider prescribed or suggested. Follow the directions carefully.  Practice any lifestyle changes that are recommended. These might include:  Drinking less fluid or drinking at different times of the day. If you need to urinate often during the night, for example, you may need to stop drinking fluids early in the evening.  Cutting down on caffeine or alcohol. They both can make you need to urinate more often than normal.  Caffeine is found in coffee, tea and sodas.  Losing weight, if that is recommended.  Keep a journal or a log. You might be asked to record how much you drink and when and when you feel the need to urinate. This will also help evaluate how well the treatment provided by your physician is working. SEEK MEDICAL CARE IF:   Your need to urinate often gets worse.  You feel increased pain or irritation when you urinate.  You notice blood in your urine.  You have questions about any medications that your healthcare provider recommended.  You notice blood, pus or swelling at the site of any test or treatment procedure.  You develop a fever of more than 100.5 F (38.1 C). SEEK IMMEDIATE MEDICAL CARE IF:  You develop a fever of more than 102.0 F (38.9 C). Document Released: 09/04/2009 Document Revised: 01/31/2012 Document Reviewed: 09/04/2009 [facility] Patient Information 2015 [place], [place]. This information is not intended to replace advice given to you by your health care provider. Make sure you discuss  any questions you have with your health care provider.  

## 2014-05-09 NOTE — Progress Notes (Signed)
Pre visit review using our clinic review tool, if applicable. No additional management support is needed unless otherwise documented below in the visit note. 

## 2014-06-14 ENCOUNTER — Other Ambulatory Visit: Payer: Self-pay | Admitting: *Deleted

## 2014-06-14 MED ORDER — SIMVASTATIN 20 MG PO TABS: 20.0000 mg | ORAL_TABLET | Freq: Every day | ORAL | Status: DC

## 2014-08-23 ENCOUNTER — Telehealth: Payer: Self-pay

## 2014-08-23 NOTE — Telephone Encounter (Signed)
The patient called hoping to get a mammogram set up. Thanks!

## 2014-08-26 ENCOUNTER — Telehealth: Payer: Self-pay | Admitting: Internal Medicine

## 2014-08-26 NOTE — Telephone Encounter (Signed)
The patient has been scheduled at [facility] on  10.26.15 @ 11. The patient is aware of this appointment.

## 2014-09-02 ENCOUNTER — Encounter: Payer: Self-pay | Admitting: Internal Medicine

## 2014-09-02 ENCOUNTER — Ambulatory Visit (INDEPENDENT_AMBULATORY_CARE_PROVIDER_SITE_OTHER): Payer: Medicare HMO | Admitting: Internal Medicine

## 2014-09-02 VITALS — BP 136/74 | HR 110 | Temp 98.5°F | Ht 66.5 in | Wt 200.0 lb

## 2014-09-02 DIAGNOSIS — R790 Abnormal level of blood mineral: Secondary | ICD-10-CM

## 2014-09-02 DIAGNOSIS — Z853 Personal history of malignant neoplasm of breast: Secondary | ICD-10-CM

## 2014-09-02 DIAGNOSIS — Z0000 Encounter for general adult medical examination without abnormal findings: Secondary | ICD-10-CM

## 2014-09-02 DIAGNOSIS — Z23 Encounter for immunization: Secondary | ICD-10-CM

## 2014-09-02 DIAGNOSIS — E669 Obesity, unspecified: Secondary | ICD-10-CM | POA: Insufficient documentation

## 2014-09-02 LAB — MICROALBUMIN / CREATININE URINE RATIO
Creatinine,U: 83.7 mg/dL
Microalb Creat Ratio: 0.2 mg/g (ref 0.0–30.0)
Microalb, Ur: 0.2 mg/dL (ref 0.0–1.9)

## 2014-09-02 LAB — COMPREHENSIVE METABOLIC PANEL
ALT: 28 U/L (ref 0–35)
AST: 29 U/L (ref 0–37)
Albumin: 4.2 g/dL (ref 3.5–5.2)
Alkaline Phosphatase: 80 U/L (ref 39–117)
BILIRUBIN TOTAL: 0.3 mg/dL (ref 0.2–1.2)
BUN: 20 mg/dL (ref 6–23)
CO2: 28 mEq/L (ref 19–32)
Calcium: 10.2 mg/dL (ref 8.4–10.5)
Chloride: 100 mEq/L (ref 96–112)
Creatinine, Ser: 1.1 mg/dL (ref 0.4–1.2)
GFR: 65.97 mL/min (ref >60.00)
GLUCOSE: 101 mg/dL — AB (ref 70–99)
Potassium: 3 mEq/L — ABNORMAL LOW (ref 3.5–5.1)
Sodium: 137 mEq/L (ref 135–145)
Total Protein: 9 g/dL — ABNORMAL HIGH (ref 6.0–8.3)

## 2014-09-02 LAB — CBC WITH DIFFERENTIAL/PLATELET
BASOS PCT: 0.5 % (ref 0.0–3.0)
Basophils Absolute: 0 10*3/uL (ref 0.0–0.1)
EOS ABS: 0.2 10*3/uL (ref 0.0–0.7)
EOS PCT: 2.7 % (ref 0.0–5.0)
HEMATOCRIT: 45.2 % (ref 36.0–46.0)
Hemoglobin: 14.9 g/dL (ref 12.0–15.0)
LYMPHS ABS: 3.1 10*3/uL (ref 0.7–4.0)
Lymphocytes Relative: 36.2 % (ref 12.0–46.0)
MCHC: 32.9 g/dL (ref 30.0–36.0)
MCV: 85.7 fl (ref 78.0–100.0)
Monocytes Absolute: 0.6 10*3/uL (ref 0.1–1.0)
Monocytes Relative: 6.6 % (ref 3.0–12.0)
NEUTROS PCT: 54 % (ref 43.0–77.0)
Neutro Abs: 4.6 10*3/uL (ref 1.4–7.7)
PLATELETS: 263 10*3/uL (ref 150.0–400.0)
RBC: 5.27 Mil/uL — AB (ref 3.87–5.11)
RDW: 14.1 % (ref 11.5–15.5)
WBC: 8.6 10*3/uL (ref 4.0–10.5)

## 2014-09-02 LAB — LIPID PANEL
CHOL/HDL RATIO: 4
CHOLESTEROL: 175 mg/dL (ref 0–200)
HDL: 47.6 mg/dL (ref >39.00)
NONHDL: 127.4
Triglycerides: 286 mg/dL — ABNORMAL HIGH (ref 0.0–149.0)
VLDL: 57.2 mg/dL — AB (ref 0.0–40.0)

## 2014-09-02 NOTE — Assessment & Plan Note (Signed)
Diagnostic left mammogram ordered.

## 2014-09-02 NOTE — Assessment & Plan Note (Signed)
Wt Readings from Last 3 Encounters:  09/02/14 200 lb (90.719 kg)  05/09/14 204 lb 8 oz (92.761 kg)  04/19/14 204 lb 4 oz (92.647 kg)   Body mass index is 31.8 kg/(m^2). Encouraged healthy diet and exercise.

## 2014-09-02 NOTE — Progress Notes (Signed)
Subjective:    Patient ID: Michelle Clark, female    DOB: 11-16-1946, 68 y.o.   MRN: [id number]  HPI The patient is here for annual Medicare wellness examination and management of other chronic and acute problems.   The risk factors are reflected in the social history.  The roster of all physicians providing medical care to patient - is listed in the Snapshot section of the chart.  Activities of daily living:  The patient is 100% independent in all ADLs: dressing, toileting, feeding as well as independent mobility. Lives with husband, and she provides care for him. No pets in home.  Home safety : The patient has smoke detectors in the home. They wear seatbelts.  There are no firearms at home. There is no violence in the home.   There is no risks for hepatitis, STDs or HIV. There is no history of blood transfusion. They have no travel history to infectious disease endemic areas of the world.  The patient has not seen their dentist in the last six month. She does not have a dentist. They have seen their eye doctor in the last year. Othalmology - Dr. Gloriann Loan  They have deferred audiologic testing in the last year.  They do not  have excessive sun exposure. Discussed the need for sun protection: hats, long sleeves and use of sunscreen if there is significant sun exposure.  Cardiology - Dr. Clayborn Bigness  Diet: the importance of a healthy diet is discussed. They do have a healthy diet.  The benefits of regular aerobic exercise were discussed. She has not been exercising..  Depression screen: there are no signs or vegative symptoms of depression- irritability, change in appetite, anhedonia, sadness/tearfullness.  No recent falls.  Cognitive assessment: the patient manages all their financial and personal affairs and is actively engaged. They could relate day,date,year and events.  She continues to be the primary caregiver for her husband.  HCPOA - none in place.  The following portions of  the patient's history were reviewed and updated as appropriate: allergies, current medications, past family history, past medical history,  past surgical history, past social history  and problem list.  Visual acuity was not assessed per patient preference since she has regular follow up with her ophthalmologist. Hearing and body mass index were assessed and reviewed.   During the course of the visit the patient was educated and counseled about appropriate screening and preventive services including : fall prevention , diabetes screening, nutrition counseling, colorectal cancer screening, and recommended immunizations.      Review of Systems  Constitutional: Negative for fever, chills, appetite change, fatigue and unexpected weight change.  Eyes: Negative for visual disturbance.  Respiratory: Negative for shortness of breath.   Cardiovascular: Negative for chest pain and leg swelling.  Gastrointestinal: Negative for abdominal pain.  Skin: Negative for color change and rash.  Hematological: Negative for adenopathy. Does not bruise/bleed easily.  Psychiatric/Behavioral: Negative for dysphoric mood. The patient is not nervous/anxious.        Objective:    BP 136/74  Pulse 110  Temp(Src) 98.5 F (36.9 C) (Oral)  Ht 5' 6.5" (1.689 m)  Wt 200 lb (90.719 kg)  BMI 31.80 kg/m2  SpO2 95% Physical Exam  Constitutional: She is oriented to person, place, and time. She appears well-developed and well-nourished. No distress.  HENT:  Head: Normocephalic and atraumatic.  Right Ear: External ear normal.  Left Ear: External ear normal.  Nose: Nose normal.  Mouth/Throat: Oropharynx is clear and  moist. No oropharyngeal exudate.  Eyes: Conjunctivae are normal. Pupils are equal, round, and reactive to light. Right eye exhibits no discharge. Left eye exhibits no discharge. No scleral icterus.  Neck: Normal range of motion. Neck supple. No tracheal deviation present. No thyromegaly present.    Cardiovascular: Normal rate, regular rhythm, normal heart sounds and intact distal pulses.  Exam reveals no gallop and no friction rub.   No murmur heard. Pulmonary/Chest: Effort normal and breath sounds normal. No accessory muscle usage. Not tachypneic. No respiratory distress. She has no decreased breath sounds. She has no wheezes. She has no rales. She exhibits no tenderness. Left breast exhibits no inverted nipple, no mass, no nipple discharge, no skin change and no tenderness. Breasts are asymmetrical.    Abdominal: Soft. Bowel sounds are normal. She exhibits no distension and no mass. There is no tenderness. There is no rebound and no guarding.  Musculoskeletal: Normal range of motion. She exhibits no edema and no tenderness.  Lymphadenopathy:    She has no cervical adenopathy.  Neurological: She is alert and oriented to person, place, and time. No cranial nerve deficit. She exhibits normal muscle tone. Coordination normal.  Skin: Skin is warm and dry. No rash noted. She is not diaphoretic. No erythema. No pallor.  Psychiatric: She has a normal mood and affect. Her behavior is normal. Judgment and thought content normal.          Assessment & Plan:   Problem List Items Addressed This Visit     Unprioritized   History of breast cancer     Diagnostic left mammogram ordered.    Relevant Orders      MM Digital Diagnostic Unilat L   Medicare annual wellness visit, subsequent - Primary     General medical exam including breast exam normal except as noted. Mammogram, unilateral left, ordered. PAP and pelvic deferred as PAP normal, HPV neg in 2014. Colonoscopy UTD. Flu vaccine today. Labs today including CBC, CMP, lipids. Encouraged healthy diet and exercise with goal of weight loss.    Relevant Orders      CBC with Differential      Comprehensive metabolic panel      Lipid panel      Microalbumin / creatinine urine ratio   Obesity (BMI 30-39.9)      Wt Readings from Last 3  Encounters:  09/02/14 200 lb (90.719 kg)  05/09/14 204 lb 8 oz (92.761 kg)  04/19/14 204 lb 4 oz (92.647 kg)   Body mass index is 31.8 kg/(m^2). Encouraged healthy diet and exercise.     Other Visit Diagnoses   Encounter for immunization            Return in about 6 months (around 03/04/2015) for [place].

## 2014-09-02 NOTE — Patient Instructions (Signed)

## 2014-09-02 NOTE — Progress Notes (Signed)
Pre visit review using our clinic review tool, if applicable. No additional management support is needed unless otherwise documented below in the visit note. 

## 2014-09-02 NOTE — Assessment & Plan Note (Signed)
General medical exam including breast exam normal except as noted. Mammogram, unilateral left, ordered. PAP and pelvic deferred as PAP normal, HPV neg in 2014. Colonoscopy UTD. Flu vaccine today. Labs today including CBC, CMP, lipids. Encouraged healthy diet and exercise with goal of weight loss.

## 2014-09-03 ENCOUNTER — Other Ambulatory Visit: Payer: Self-pay | Admitting: *Deleted

## 2014-09-03 ENCOUNTER — Encounter: Payer: Self-pay | Admitting: *Deleted

## 2014-09-03 DIAGNOSIS — E876 Hypokalemia: Secondary | ICD-10-CM

## 2014-09-03 LAB — LDL CHOLESTEROL, DIRECT: Direct LDL: 98 mg/dL

## 2014-09-05 NOTE — Telephone Encounter (Signed)
Called patient, notified of labs. Lab appt scheduled 09/09/14

## 2014-09-09 ENCOUNTER — Telehealth: Payer: Self-pay | Admitting: Internal Medicine

## 2014-09-09 ENCOUNTER — Other Ambulatory Visit (INDEPENDENT_AMBULATORY_CARE_PROVIDER_SITE_OTHER): Payer: Medicare HMO

## 2014-09-09 DIAGNOSIS — E876 Hypokalemia: Secondary | ICD-10-CM

## 2014-09-09 LAB — BASIC METABOLIC PANEL
BUN: 16 mg/dL (ref 6–23)
CHLORIDE: 99 meq/L (ref 96–112)
CO2: 31 meq/L (ref 19–32)
Calcium: 9.6 mg/dL (ref 8.4–10.5)
Creatinine, Ser: 0.9 mg/dL (ref 0.4–1.2)
GFR: 79.68 mL/min (ref >60.00)
Glucose, Bld: 117 mg/dL — ABNORMAL HIGH (ref 70–99)
POTASSIUM: 3 meq/L — AB (ref 3.5–5.1)
Sodium: 138 mEq/L (ref 135–145)

## 2014-09-09 NOTE — Telephone Encounter (Signed)
Left message for pt to return my call.

## 2014-09-09 NOTE — Telephone Encounter (Signed)
Labs showed that potassium continues to be low. I would like to start Potassium chloride 3meq daily x 2 days, then repeat BMP on Wed or Thursday.

## 2014-09-10 NOTE — Telephone Encounter (Signed)
Left message with spouse to call back

## 2014-09-11 NOTE — Telephone Encounter (Signed)
Left vm for pt to return my call.  

## 2014-09-12 ENCOUNTER — Other Ambulatory Visit: Payer: Self-pay | Admitting: *Deleted

## 2014-09-12 ENCOUNTER — Telehealth: Payer: Self-pay

## 2014-09-12 MED ORDER — POTASSIUM CHLORIDE CRYS ER 20 MEQ PO TBCR: 20.0000 meq | EXTENDED_RELEASE_TABLET | Freq: Every day | ORAL | Status: DC

## 2014-09-12 NOTE — Telephone Encounter (Signed)
The patient called hoping to get an update on her potassium medication.

## 2014-09-12 NOTE — Telephone Encounter (Signed)
Rx sent to pharmacy   

## 2014-09-16 ENCOUNTER — Telehealth: Payer: Self-pay | Admitting: *Deleted

## 2014-09-16 ENCOUNTER — Other Ambulatory Visit (INDEPENDENT_AMBULATORY_CARE_PROVIDER_SITE_OTHER): Payer: Medicare HMO

## 2014-09-16 DIAGNOSIS — E876 Hypokalemia: Secondary | ICD-10-CM

## 2014-09-16 NOTE — Telephone Encounter (Signed)
The only thing that I could see to check was potassium.  I placed the order.

## 2014-09-16 NOTE — Telephone Encounter (Signed)
What labs and dx?  

## 2014-09-17 LAB — POTASSIUM: Potassium: 3.6 mEq/L (ref 3.5–5.1)

## 2014-09-17 NOTE — Telephone Encounter (Signed)
Yes,potassium for hypokalemia

## 2014-09-18 ENCOUNTER — Other Ambulatory Visit: Payer: Self-pay | Admitting: Internal Medicine

## 2014-09-18 DIAGNOSIS — E876 Hypokalemia: Secondary | ICD-10-CM

## 2014-09-18 NOTE — Progress Notes (Signed)
Order placed for f/u potassium.  

## 2014-09-19 ENCOUNTER — Ambulatory Visit: Payer: Self-pay | Admitting: Internal Medicine

## 2014-09-19 LAB — HM MAMMOGRAPHY: HM Mammogram: NEGATIVE

## 2014-09-23 ENCOUNTER — Encounter: Payer: Self-pay | Admitting: *Deleted

## 2014-09-26 ENCOUNTER — Other Ambulatory Visit (INDEPENDENT_AMBULATORY_CARE_PROVIDER_SITE_OTHER): Payer: Medicare HMO

## 2014-09-26 DIAGNOSIS — E876 Hypokalemia: Secondary | ICD-10-CM

## 2014-09-26 LAB — POTASSIUM: Potassium: 3.1 mEq/L — ABNORMAL LOW (ref 3.5–5.1)

## 2014-09-27 ENCOUNTER — Telehealth: Payer: Self-pay | Admitting: *Deleted

## 2014-09-27 ENCOUNTER — Telehealth: Payer: Self-pay | Admitting: Internal Medicine

## 2014-09-27 ENCOUNTER — Other Ambulatory Visit: Payer: Self-pay | Admitting: *Deleted

## 2014-09-27 MED ORDER — POTASSIUM CHLORIDE CRYS ER 20 MEQ PO TBCR: 20.0000 meq | EXTENDED_RELEASE_TABLET | Freq: Every day | ORAL | Status: DC

## 2014-09-27 NOTE — Telephone Encounter (Signed)
What labs and dx?  

## 2014-09-27 NOTE — Telephone Encounter (Signed)
Pt called states she only takes Lasix 40mg  as needed for swelling, last taken Sept. 2015.  Pt aware of KDur Rx.  Lab appoint scheduled.

## 2014-09-27 NOTE — Telephone Encounter (Signed)
BMP for hypokalemia 

## 2014-09-27 NOTE — Telephone Encounter (Signed)
Michelle Clark called saying she received a message in MyChart suggesting that she take a medication she's not heard of before and then follow-up afterwards to see if it helps with her symptoms. Michelle Clark is confused and isn't sure if the medication that was suggested is something she can get over the counter or if it's something that has to be prescribed and what it's supposed to do for her. Please call the patient with details in regards to the My Chart message.  Pt ph# [phone]) or [phone] or after) Thank you.

## 2014-09-30 ENCOUNTER — Other Ambulatory Visit (INDEPENDENT_AMBULATORY_CARE_PROVIDER_SITE_OTHER): Payer: Medicare HMO

## 2014-09-30 ENCOUNTER — Other Ambulatory Visit: Payer: Self-pay | Admitting: *Deleted

## 2014-09-30 DIAGNOSIS — E876 Hypokalemia: Secondary | ICD-10-CM

## 2014-09-30 LAB — BASIC METABOLIC PANEL
BUN: 18 mg/dL (ref 6–23)
CO2: 23 meq/L (ref 19–32)
Calcium: 9.4 mg/dL (ref 8.4–10.5)
Chloride: 101 mEq/L (ref 96–112)
Creatinine, Ser: 0.9 mg/dL (ref 0.4–1.2)
GFR: 83.96 mL/min (ref >60.00)
Glucose, Bld: 108 mg/dL — ABNORMAL HIGH (ref 70–99)
POTASSIUM: 3.3 meq/L — AB (ref 3.5–5.1)
SODIUM: 137 meq/L (ref 135–145)

## 2014-10-01 ENCOUNTER — Telehealth: Payer: Self-pay

## 2014-10-01 ENCOUNTER — Other Ambulatory Visit: Payer: Self-pay | Admitting: *Deleted

## 2014-10-01 DIAGNOSIS — E876 Hypokalemia: Secondary | ICD-10-CM

## 2014-10-01 MED ORDER — POTASSIUM CHLORIDE CRYS ER 20 MEQ PO TBCR: 20.0000 meq | EXTENDED_RELEASE_TABLET | Freq: Every day | ORAL | Status: DC

## 2014-10-01 NOTE — Telephone Encounter (Signed)
Pt notified, see result note.

## 2014-10-01 NOTE — Telephone Encounter (Signed)
The patient called and is hoping to get information on her most recent lab work.

## 2014-10-04 ENCOUNTER — Other Ambulatory Visit: Payer: Self-pay | Admitting: *Deleted

## 2014-10-04 ENCOUNTER — Other Ambulatory Visit (INDEPENDENT_AMBULATORY_CARE_PROVIDER_SITE_OTHER): Payer: Medicare HMO

## 2014-10-04 DIAGNOSIS — E876 Hypokalemia: Secondary | ICD-10-CM

## 2014-10-04 LAB — BASIC METABOLIC PANEL
BUN: 17 mg/dL (ref 6–23)
CHLORIDE: 101 meq/L (ref 96–112)
CO2: 25 meq/L (ref 19–32)
Calcium: 9.7 mg/dL (ref 8.4–10.5)
Creatinine, Ser: 0.9 mg/dL (ref 0.4–1.2)
GFR: 78.65 mL/min (ref >60.00)
Glucose, Bld: 114 mg/dL — ABNORMAL HIGH (ref 70–99)
Potassium: 3.4 mEq/L — ABNORMAL LOW (ref 3.5–5.1)
Sodium: 137 mEq/L (ref 135–145)

## 2014-10-04 MED ORDER — POTASSIUM CHLORIDE CRYS ER 20 MEQ PO TBCR: 20.0000 meq | EXTENDED_RELEASE_TABLET | Freq: Every day | ORAL | Status: DC

## 2014-10-08 ENCOUNTER — Other Ambulatory Visit: Payer: Self-pay | Admitting: *Deleted

## 2014-10-08 DIAGNOSIS — I10 Essential (primary) hypertension: Secondary | ICD-10-CM

## 2014-10-08 MED ORDER — TRIAMTERENE-HCTZ 75-50 MG PO TABS: 1.0000 | ORAL_TABLET | Freq: Every day | ORAL | Status: DC

## 2014-10-10 ENCOUNTER — Other Ambulatory Visit: Payer: Medicare HMO

## 2014-10-16 ENCOUNTER — Other Ambulatory Visit (INDEPENDENT_AMBULATORY_CARE_PROVIDER_SITE_OTHER): Payer: Medicare HMO

## 2014-10-16 DIAGNOSIS — E876 Hypokalemia: Secondary | ICD-10-CM

## 2014-10-16 LAB — BASIC METABOLIC PANEL
BUN: 24 mg/dL — AB (ref 6–23)
CO2: 28 mEq/L (ref 19–32)
Calcium: 9.9 mg/dL (ref 8.4–10.5)
Chloride: 99 mEq/L (ref 96–112)
Creatinine, Ser: 1 mg/dL (ref 0.4–1.2)
GFR: 73.06 mL/min (ref >60.00)
GLUCOSE: 98 mg/dL (ref 70–99)
POTASSIUM: 3.7 meq/L (ref 3.5–5.1)
Sodium: 135 mEq/L (ref 135–145)

## 2014-10-24 ENCOUNTER — Ambulatory Visit (INDEPENDENT_AMBULATORY_CARE_PROVIDER_SITE_OTHER): Payer: Medicare HMO | Admitting: Internal Medicine

## 2014-10-24 ENCOUNTER — Encounter: Payer: Self-pay | Admitting: Internal Medicine

## 2014-10-24 VITALS — BP 149/81 | HR 93 | Temp 98.1°F | Ht 66.5 in | Wt 200.5 lb

## 2014-10-24 DIAGNOSIS — I10 Essential (primary) hypertension: Secondary | ICD-10-CM

## 2014-10-24 DIAGNOSIS — Z634 Disappearance and death of family member: Secondary | ICD-10-CM | POA: Insufficient documentation

## 2014-10-24 MED ORDER — LOSARTAN POTASSIUM 25 MG PO TABS: 25.0000 mg | ORAL_TABLET | Freq: Every day | ORAL | Status: DC

## 2014-10-24 NOTE — Assessment & Plan Note (Signed)
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 Will change to Losartan and recheck Cr and K in 1 week. Plan to titrate up losartan dose and may add HCTZ in the future if needed. Follow up 4 weeks.

## 2014-10-24 NOTE — Patient Instructions (Addendum)
Stop Triamterene-HCTZ.  Start Losartan 25mg  daily.  Labs next week to recheck potassium.  Follow up in 2 weeks.

## 2014-10-24 NOTE — Progress Notes (Signed)
Subjective:    Patient ID: Michelle Clark, female    DOB: 1946-04-24, 68 y.o.   MRN: [id number]  HPI 68YO female presents for follow up.  Difficult time for her. Her sister died earlier this week. Her son is struggling with this. She is worried about his wellbeing.  She would like to get off her Maxide, given repeated issues with low potassium. This medication was started many years ago. She does not generally check her BP at home.   Review of Systems  Constitutional: Negative for fever, chills, appetite change, fatigue and unexpected weight change.  Eyes: Negative for visual disturbance.  Respiratory: Negative for shortness of breath.   Cardiovascular: Negative for chest pain, palpitations and leg swelling.  Gastrointestinal: Negative for abdominal pain, diarrhea and constipation.  Skin: Negative for color change and rash.  Neurological: Negative for headaches.  Hematological: Negative for adenopathy. Does not bruise/bleed easily.  Psychiatric/Behavioral: Positive for dysphoric mood. Negative for suicidal ideas and sleep disturbance. The patient is not nervous/anxious.        Objective:    BP 149/81 mmHg  Pulse 93  Temp(Src) 98.1 F (36.7 C) (Oral)  Ht 5' 6.5" (1.689 m)  Wt 200 lb 8 oz (90.946 kg)  BMI 31.88 kg/m2  SpO2 99% Physical Exam  Constitutional: She is oriented to person, place, and time. She appears well-developed and well-nourished. No distress.  HENT:  Head: Normocephalic and atraumatic.  Right Ear: External ear normal.  Left Ear: External ear normal.  Nose: Nose normal.  Mouth/Throat: Oropharynx is clear and moist. No oropharyngeal exudate.  Eyes: Conjunctivae are normal. Pupils are equal, round, and reactive to light. Right eye exhibits no discharge. Left eye exhibits no discharge. No scleral icterus.  Neck: Normal range of motion. Neck supple. No tracheal deviation present. No thyromegaly present.  Cardiovascular: Normal rate, regular rhythm, normal  heart sounds and intact distal pulses.  Exam reveals no gallop and no friction rub.   No murmur heard. Pulmonary/Chest: Effort normal and breath sounds normal. No accessory muscle usage. No tachypnea. No respiratory distress. She has no decreased breath sounds. She has no wheezes. She has no rhonchi. She has no rales. She exhibits no tenderness.  Musculoskeletal: Normal range of motion. She exhibits no edema or tenderness.  Lymphadenopathy:    She has no cervical adenopathy.  Neurological: She is alert and oriented to person, place, and time. No cranial nerve deficit. She exhibits normal muscle tone. Coordination normal.  Skin: Skin is warm and dry. No rash noted. She is not diaphoretic. No erythema. No pallor.  Psychiatric: Her speech is normal and behavior is normal. Judgment and thought content normal. She exhibits a depressed mood. She expresses no suicidal ideation. She expresses no suicidal plans.          Assessment & Plan:   Problem List Items Addressed This Visit      Unprioritized   Bereavement    Offered support today given recent death of her sister. Gave information on counseling resources.    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 Will change to Losartan and recheck Cr and K in 1 week. Plan to titrate up losartan dose and may add HCTZ in the future if needed. Follow up 4 weeks.    Relevant Medications      losartan (COZAAR) tablet  Other Relevant Orders      Basic Metabolic Panel (BMET)       Return in about 2 weeks (around 11/07/2014) for Recheck of Blood Pressure.

## 2014-10-24 NOTE — Progress Notes (Signed)
Pre visit review using our clinic review tool, if applicable. No additional management support is needed unless otherwise documented below in the visit note. 

## 2014-10-24 NOTE — Assessment & Plan Note (Signed)
Offered support today given recent death of her sister. Gave information on counseling resources.

## 2014-10-25 ENCOUNTER — Telehealth: Payer: Self-pay | Admitting: Internal Medicine

## 2014-10-25 NOTE — Telephone Encounter (Signed)
emmi emailed °

## 2014-11-11 ENCOUNTER — Other Ambulatory Visit: Payer: Self-pay | Admitting: *Deleted

## 2014-11-11 MED ORDER — POTASSIUM CHLORIDE CRYS ER 20 MEQ PO TBCR: 20.0000 meq | EXTENDED_RELEASE_TABLET | Freq: Every day | ORAL | Status: DC

## 2014-12-11 ENCOUNTER — Other Ambulatory Visit: Payer: Self-pay

## 2014-12-11 ENCOUNTER — Other Ambulatory Visit: Payer: Self-pay | Admitting: *Deleted

## 2014-12-11 MED ORDER — SIMVASTATIN 20 MG PO TABS: 20.0000 mg | ORAL_TABLET | Freq: Every day | ORAL | Status: DC

## 2015-01-24 ENCOUNTER — Telehealth: Payer: Self-pay | Admitting: *Deleted

## 2015-01-24 ENCOUNTER — Ambulatory Visit (INDEPENDENT_AMBULATORY_CARE_PROVIDER_SITE_OTHER): Payer: PPO | Admitting: Internal Medicine

## 2015-01-24 ENCOUNTER — Other Ambulatory Visit (INDEPENDENT_AMBULATORY_CARE_PROVIDER_SITE_OTHER): Payer: PPO

## 2015-01-24 ENCOUNTER — Encounter: Payer: Self-pay | Admitting: Internal Medicine

## 2015-01-24 VITALS — BP 170/92 | HR 99 | Resp 14 | Ht 66.5 in | Wt 203.5 lb

## 2015-01-24 DIAGNOSIS — Z634 Disappearance and death of family member: Secondary | ICD-10-CM

## 2015-01-24 DIAGNOSIS — I10 Essential (primary) hypertension: Secondary | ICD-10-CM

## 2015-01-24 LAB — BASIC METABOLIC PANEL
BUN: 13 mg/dL (ref 6–23)
CHLORIDE: 105 meq/L (ref 96–112)
CO2: 30 meq/L (ref 19–32)
Calcium: 9.7 mg/dL (ref 8.4–10.5)
Creatinine, Ser: 0.87 mg/dL (ref 0.40–1.20)
GFR: 82.77 mL/min (ref >60.00)
GLUCOSE: 108 mg/dL — AB (ref 70–99)
POTASSIUM: 3.3 meq/L — AB (ref 3.5–5.1)
SODIUM: 139 meq/L (ref 135–145)

## 2015-01-24 MED ORDER — LOSARTAN POTASSIUM 50 MG PO TABS: 50.0000 mg | ORAL_TABLET | Freq: Every day | ORAL | Status: DC

## 2015-01-24 NOTE — Telephone Encounter (Signed)
Left detailed message on pts VM of MDs message.

## 2015-01-24 NOTE — Progress Notes (Signed)
Pre visit review using our clinic review tool, if applicable. No additional management support is needed unless otherwise documented below in the visit note. 

## 2015-01-24 NOTE — Telephone Encounter (Signed)
She never followed up to have BMP to check K and Cr 1 week after her visit in December and to have a follow up visit. She never had either of these. Needs labs today. Needs follow up visit asap. We can check BP when comes in for labs today.

## 2015-01-24 NOTE — Progress Notes (Signed)
   Subjective:    Patient ID: Michelle Clark, female    DOB: 1946/11/03, 69 y.o.   MRN: [id number]  HPI  69YO female presents for follow up.  HTN - Taking Losartan every day. No chest pain, or palptiations. Occasional headache.  Difficult time for her. Sister died in 10/26/2023 from breast cancer. She is tearful talking about this. Feels, however that she is coping well.   Past medical, surgical, family and social history per today's encounter.  Review of Systems  Constitutional: Negative for fever, chills, appetite change, fatigue and unexpected weight change.  Eyes: Negative for visual disturbance.  Respiratory: Negative for shortness of breath.   Cardiovascular: Negative for chest pain and leg swelling.  Gastrointestinal: Negative for nausea, vomiting, abdominal pain, diarrhea and constipation.  Skin: Negative for color change and rash.  Neurological: Positive for headaches.  Hematological: Negative for adenopathy. Does not bruise/bleed easily.  Psychiatric/Behavioral: Positive for dysphoric mood. Negative for sleep disturbance. The patient is not nervous/anxious.        Objective:    BP 170/92 mmHg  Pulse 99  Resp 14  Ht 5' 6.5" (1.689 m)  Wt 203 lb 8 oz (92.307 kg)  BMI 32.36 kg/m2  SpO2 98% Physical Exam  Constitutional: She is oriented to person, place, and time. She appears well-developed and well-nourished. No distress.  HENT:  Head: Normocephalic and atraumatic.  Right Ear: External ear normal.  Left Ear: External ear normal.  Nose: Nose normal.  Mouth/Throat: Oropharynx is clear and moist. No oropharyngeal exudate.  Eyes: Conjunctivae are normal. Pupils are equal, round, and reactive to light. Right eye exhibits no discharge. Left eye exhibits no discharge. No scleral icterus.  Neck: Normal range of motion. Neck supple. No tracheal deviation present. No thyromegaly present.  Cardiovascular: Normal rate, regular rhythm, normal heart sounds and intact distal  pulses.  Exam reveals no gallop and no friction rub.   No murmur heard. Pulmonary/Chest: Effort normal and breath sounds normal. No respiratory distress. She has no wheezes. She has no rales. She exhibits no tenderness.  Musculoskeletal: Normal range of motion. She exhibits no edema or tenderness.  Lymphadenopathy:    She has no cervical adenopathy.  Neurological: She is alert and oriented to person, place, and time. No cranial nerve deficit. She exhibits normal muscle tone. Coordination normal.  Skin: Skin is warm and dry. No rash noted. She is not diaphoretic. No erythema. No pallor.  Psychiatric: Her behavior is normal. Judgment and thought content normal. She exhibits a depressed mood.          Assessment & Plan:   Problem List Items Addressed This Visit      Unprioritized   Bereavement    Offered support today. Encouraged her to seek counseling as needed given her sister's recent death.      Essential hypertension, benign - Primary    BP Readings from Last 3 Encounters:  01/24/15 170/92  10/24/14 149/81  09/02/14 136/74   BP elevated. Will increase Losartan to 50mg  daily. BMP today. Follow up Monday for BP recheck.      Relevant Medications   losartan (COZAAR) tablet       Return in about 1 week (around 01/31/2015) for Recheck.

## 2015-01-24 NOTE — Telephone Encounter (Signed)
Michelle Clark called states the pt was seen today in the office for colonoscopy, states pts BP was 197/98.  Further states pt had no edema nor was in any distress.  States pts BP meds was changed at last OV.  Please advise pt.

## 2015-01-24 NOTE — Assessment & Plan Note (Signed)
Offered support today. Encouraged her to seek counseling as needed given her sister's recent death.

## 2015-01-24 NOTE — Assessment & Plan Note (Signed)
BP Readings from Last 3 Encounters:  01/24/15 170/92  10/24/14 149/81  09/02/14 136/74   BP elevated. Will increase Losartan to 50mg  daily. BMP today. Follow up Monday for BP recheck.

## 2015-01-24 NOTE — Patient Instructions (Signed)
Increase Losartan to 50mg  daily (two of the 25mg  tablets).  Labs today.  Follow up in 1 week.

## 2015-01-27 ENCOUNTER — Ambulatory Visit (INDEPENDENT_AMBULATORY_CARE_PROVIDER_SITE_OTHER): Payer: PPO | Admitting: Internal Medicine

## 2015-01-27 ENCOUNTER — Encounter: Payer: Self-pay | Admitting: Internal Medicine

## 2015-01-27 VITALS — BP 163/84 | HR 106 | Temp 98.4°F | Ht 66.5 in | Wt 205.5 lb

## 2015-01-27 DIAGNOSIS — I10 Essential (primary) hypertension: Secondary | ICD-10-CM

## 2015-01-27 MED ORDER — LOSARTAN POTASSIUM 100 MG PO TABS
100.0000 mg | ORAL_TABLET | Freq: Every day | ORAL | Status: DC
Start: 2015-01-27 — End: 2015-02-19

## 2015-01-27 NOTE — Assessment & Plan Note (Signed)
BP Readings from Last 3 Encounters:  01/27/15 163/84  01/24/15 170/92  10/24/14 149/81   BP continues to be elevated, however improved compared to last week. Will increase Losartan to 100mg  daily. Recheck Cr and K on Friday. Recheck BP on Friday.

## 2015-01-27 NOTE — Progress Notes (Signed)
Pre visit review using our clinic review tool, if applicable. No additional management support is needed unless otherwise documented below in the visit note. 

## 2015-01-27 NOTE — Patient Instructions (Addendum)
Increase Losartan to 100mg  daily.  Recheck labs on Friday with follow up on Friday.

## 2015-01-27 NOTE — Progress Notes (Signed)
   Subjective:    Patient ID: Michelle Clark, female    DOB: May 02, 1946, 69 y.o.   MRN: [id number]  HPI  69YO female presents for follow up.  Last seen 3/4 for elevated BP. Dose of Losartan increased to 50mg  daily. Tolerating medication well.  No side effects noted. Stopped KCl and Lasix.   Past medical, surgical, family and social history per today's encounter.  Review of Systems  Constitutional: Negative for fever, chills, appetite change, fatigue and unexpected weight change.  Eyes: Negative for visual disturbance.  Respiratory: Negative for shortness of breath.   Cardiovascular: Negative for chest pain, palpitations and leg swelling.  Gastrointestinal: Negative for abdominal pain.  Skin: Negative for color change and rash.  Neurological: Negative for headaches.  Hematological: Negative for adenopathy. Does not bruise/bleed easily.  Psychiatric/Behavioral: Negative for dysphoric mood. The patient is not nervous/anxious.        Objective:    BP 163/84 mmHg  Pulse 106  Temp(Src) 98.4 F (36.9 C) (Oral)  Ht 5' 6.5" (1.689 m)  Wt 205 lb 8 oz (93.214 kg)  BMI 32.68 kg/m2  SpO2 98% Physical Exam  Constitutional: She is oriented to person, place, and time. She appears well-developed and well-nourished. No distress.  HENT:  Head: Normocephalic and atraumatic.  Right Ear: External ear normal.  Left Ear: External ear normal.  Nose: Nose normal.  Mouth/Throat: Oropharynx is clear and moist.  Eyes: Conjunctivae are normal. Pupils are equal, round, and reactive to light. Right eye exhibits no discharge. Left eye exhibits no discharge. No scleral icterus.  Neck: Normal range of motion. Neck supple. No tracheal deviation present. No thyromegaly present.  Cardiovascular: Regular rhythm, normal heart sounds and intact distal pulses.  Tachycardia present.  Exam reveals no gallop and no friction rub.   No murmur heard. Pulmonary/Chest: Effort normal and breath sounds normal. No  respiratory distress. She has no wheezes. She has no rales. She exhibits no tenderness.  Musculoskeletal: Normal range of motion. She exhibits no edema or tenderness.  Lymphadenopathy:    She has no cervical adenopathy.  Neurological: She is alert and oriented to person, place, and time. No cranial nerve deficit. She exhibits normal muscle tone. Coordination normal.  Skin: Skin is warm and dry. No rash noted. She is not diaphoretic. No erythema. No pallor.  Psychiatric: She has a normal mood and affect. Her behavior is normal. Judgment and thought content normal.          Assessment & Plan:   Problem List Items Addressed This Visit      Unprioritized   Essential hypertension, benign - Primary    BP Readings from Last 3 Encounters:  01/27/15 163/84  01/24/15 170/92  10/24/14 149/81   BP continues to be elevated, however improved compared to last week. Will increase Losartan to 100mg  daily. Recheck Cr and K on Friday. Recheck BP on Friday.      Relevant Medications   losartan (COZAAR) tablet       Return in about 4 days (around 01/31/2015) for Recheck.

## 2015-01-31 ENCOUNTER — Ambulatory Visit (INDEPENDENT_AMBULATORY_CARE_PROVIDER_SITE_OTHER): Payer: PPO | Admitting: Internal Medicine

## 2015-01-31 ENCOUNTER — Encounter: Payer: Self-pay | Admitting: Internal Medicine

## 2015-01-31 ENCOUNTER — Ambulatory Visit: Payer: Medicare HMO | Admitting: Internal Medicine

## 2015-01-31 VITALS — BP 142/86 | HR 101 | Resp 14 | Ht 66.0 in | Wt 205.0 lb

## 2015-01-31 DIAGNOSIS — I10 Essential (primary) hypertension: Secondary | ICD-10-CM

## 2015-01-31 LAB — BASIC METABOLIC PANEL
BUN: 12 mg/dL (ref 6–23)
CALCIUM: 9.5 mg/dL (ref 8.4–10.5)
CO2: 26 meq/L (ref 19–32)
Chloride: 102 mEq/L (ref 96–112)
Creat: 0.68 mg/dL (ref 0.50–1.10)
Glucose, Bld: 75 mg/dL (ref 70–99)
Potassium: 3.8 mEq/L (ref 3.5–5.3)
SODIUM: 138 meq/L (ref 135–145)

## 2015-01-31 NOTE — Progress Notes (Signed)
   Subjective:    Patient ID: Michelle Clark, female    DOB: 1946/09/25, 69 y.o.   MRN: [id number]  HPI  69YO female presents for follow up.  Losartan increased to 100mg  this week. No side effects noted.  Feeling well. No concerns today.  Past medical, surgical, family and social history per today's encounter.  Review of Systems  Constitutional: Negative for fever, chills, appetite change, fatigue and unexpected weight change.  Eyes: Negative for visual disturbance.  Respiratory: Negative for shortness of breath.   Cardiovascular: Negative for chest pain, palpitations and leg swelling.  Gastrointestinal: Negative for abdominal pain.  Skin: Negative for color change and rash.  Neurological: Negative for headaches.  Hematological: Negative for adenopathy. Does not bruise/bleed easily.  Psychiatric/Behavioral: Negative for dysphoric mood. The patient is not nervous/anxious.        Objective:    BP 142/86 mmHg  Pulse 101  Resp 14  Ht 5\' 6"  (1.676 m)  Wt 205 lb (92.987 kg)  BMI 33.10 kg/m2  SpO2 98% Physical Exam  Constitutional: She is oriented to person, place, and time. She appears well-developed and well-nourished. No distress.  HENT:  Head: Normocephalic and atraumatic.  Right Ear: External ear normal.  Left Ear: External ear normal.  Nose: Nose normal.  Mouth/Throat: Oropharynx is clear and moist.  Eyes: Conjunctivae are normal. Pupils are equal, round, and reactive to light. Right eye exhibits no discharge. Left eye exhibits no discharge. No scleral icterus.  Neck: Normal range of motion. Neck supple. No tracheal deviation present. No thyromegaly present.  Cardiovascular: Normal rate, regular rhythm, normal heart sounds and intact distal pulses.  Exam reveals no gallop and no friction rub.   No murmur heard. Pulmonary/Chest: Effort normal and breath sounds normal. No respiratory distress. She has no wheezes. She has no rales. She exhibits no tenderness.    Musculoskeletal: Normal range of motion. She exhibits no edema or tenderness.  Lymphadenopathy:    She has no cervical adenopathy.  Neurological: She is alert and oriented to person, place, and time. No cranial nerve deficit. She exhibits normal muscle tone. Coordination normal.  Skin: Skin is warm and dry. No rash noted. She is not diaphoretic. No erythema. No pallor.  Psychiatric: She has a normal mood and affect. Her behavior is normal. Judgment and thought content normal.          Assessment & Plan:   Problem List Items Addressed This Visit      Unprioritized   Essential hypertension, benign - Primary    BP Readings from Last 3 Encounters:  01/31/15 142/86  01/27/15 163/84  01/24/15 170/92   BP continues to be slightly elevated. Discussed adding HCTZ to losartan. However, will hold for now, as she just started higher dose Losartan. Repeat BP in 2-4 weeks. Renal function with labs today.      Relevant Orders   Basic Metabolic Panel (BMET)       Return in about 2 weeks (around 02/14/2015).

## 2015-01-31 NOTE — Progress Notes (Signed)
Pre visit review using our clinic review tool, if applicable. No additional management support is needed unless otherwise documented below in the visit note. 

## 2015-01-31 NOTE — Assessment & Plan Note (Signed)
BP Readings from Last 3 Encounters:  01/31/15 142/86  01/27/15 163/84  01/24/15 170/92   BP continues to be slightly elevated. Discussed adding HCTZ to losartan. However, will hold for now, as she just started higher dose Losartan. Repeat BP in 2-4 weeks. Renal function with labs today.

## 2015-01-31 NOTE — Patient Instructions (Signed)
Labs today     Follow up in 2 weeks

## 2015-02-05 ENCOUNTER — Encounter: Payer: Self-pay | Admitting: Internal Medicine

## 2015-02-05 ENCOUNTER — Ambulatory Visit (INDEPENDENT_AMBULATORY_CARE_PROVIDER_SITE_OTHER): Payer: PPO | Admitting: Internal Medicine

## 2015-02-05 ENCOUNTER — Telehealth: Payer: Self-pay | Admitting: Internal Medicine

## 2015-02-05 VITALS — BP 162/96 | HR 100 | Temp 98.1°F | Ht 66.0 in | Wt 201.5 lb

## 2015-02-05 DIAGNOSIS — I10 Essential (primary) hypertension: Secondary | ICD-10-CM

## 2015-02-05 MED ORDER — AMLODIPINE BESYLATE 5 MG PO TABS: 5.0000 mg | ORAL_TABLET | Freq: Every day | ORAL | Status: DC

## 2015-02-05 NOTE — Assessment & Plan Note (Signed)
BP Readings from Last 3 Encounters:  02/05/15 162/96  01/31/15 142/86  01/27/15 163/84   Persistent elevation of BP on Losartan. Will add Amlodipine. Recheck BP here tomorrow. Follow up in 2 weeks and prn.

## 2015-02-05 NOTE — Progress Notes (Signed)
   Subjective:    Patient ID: Michelle Clark, female    DOB: [birth year], 69 y.o.   MRN: [id number]  HPI  69YO female presents for acute visit.  Last night, felt poorly with headache. BP was 180/112. No chest pain, dyspnea. Denies new stressors at home. Compliant with Losartan. No other concerns.   Past medical, surgical, family and social history per today's encounter.  Review of Systems  Constitutional: Negative for fever, chills, appetite change, fatigue and unexpected weight change.  Eyes: Negative for visual disturbance.  Respiratory: Negative for shortness of breath.   Cardiovascular: Negative for chest pain, palpitations and leg swelling.  Gastrointestinal: Negative for abdominal pain.  Skin: Negative for color change and rash.  Neurological: Positive for headaches. Negative for dizziness, syncope, facial asymmetry, speech difficulty, weakness, light-headedness and numbness.  Hematological: Negative for adenopathy. Does not bruise/bleed easily.  Psychiatric/Behavioral: Negative for dysphoric mood. The patient is not nervous/anxious.        Objective:    BP 162/96 mmHg  Pulse 100  Temp(Src) 98.1 F (36.7 C) (Oral)  Ht 5\' 6"  (1.676 m)  Wt 201 lb 8 oz (91.4 kg)  BMI 32.54 kg/m2  SpO2 100% Physical Exam  Constitutional: She is oriented to person, place, and time. She appears well-developed and well-nourished. No distress.  HENT:  Head: Normocephalic and atraumatic.  Right Ear: External ear normal.  Left Ear: External ear normal.  Nose: Nose normal.  Mouth/Throat: Oropharynx is clear and moist. No oropharyngeal exudate.  Eyes: Conjunctivae are normal. Pupils are equal, round, and reactive to light. Right eye exhibits no discharge. Left eye exhibits no discharge. No scleral icterus.  Neck: Normal range of motion. Neck supple. No tracheal deviation present. No thyromegaly present.  Cardiovascular: Normal rate, regular rhythm, normal heart sounds and intact distal  pulses.  Exam reveals no gallop and no friction rub.   No murmur heard. Pulmonary/Chest: Effort normal and breath sounds normal. No respiratory distress. She has no wheezes. She has no rales. She exhibits no tenderness.  Musculoskeletal: Normal range of motion. She exhibits no edema or tenderness.  Lymphadenopathy:    She has no cervical adenopathy.  Neurological: She is alert and oriented to person, place, and time. No cranial nerve deficit. She exhibits normal muscle tone. Coordination normal.  Skin: Skin is warm and dry. No rash noted. She is not diaphoretic. No erythema. No pallor.  Psychiatric: She has a normal mood and affect. Her behavior is normal. Judgment and thought content normal.          Assessment & Plan:   Problem List Items Addressed This Visit      Unprioritized   Essential hypertension, benign - Primary       No Follow-up on file.

## 2015-02-05 NOTE — Patient Instructions (Signed)
Start Amlodipine 5mg  daily.  Continue Losartan 100mg  daily.  Follow up tomorrow.

## 2015-02-05 NOTE — Telephone Encounter (Signed)
Patient's blood pressure was 189/112 yesterday evening and she would like to talk to the doctor about it.

## 2015-02-05 NOTE — Progress Notes (Signed)
Pre visit review using our clinic review tool, if applicable. No additional management support is needed unless otherwise documented below in the visit note. 

## 2015-02-05 NOTE — Telephone Encounter (Signed)
Please add pt to the schedule, I've already verified appt with pt

## 2015-02-05 NOTE — Telephone Encounter (Signed)
Can we bring her in at 11:45?

## 2015-02-06 ENCOUNTER — Ambulatory Visit (INDEPENDENT_AMBULATORY_CARE_PROVIDER_SITE_OTHER): Payer: PPO | Admitting: *Deleted

## 2015-02-06 VITALS — BP 148/88 | HR 109

## 2015-02-06 DIAGNOSIS — I10 Essential (primary) hypertension: Secondary | ICD-10-CM | POA: Diagnosis not present

## 2015-02-06 NOTE — Progress Notes (Signed)
Pt presents for BP check. Verified BP meds and confirmed taking. States feels much better than yesterday. BP 148/88 HR 109. Advised I would call back if further instructions

## 2015-02-06 NOTE — Progress Notes (Signed)
BP is much improved. I would like her to continue medications and follow up with me in 2-4 weeks.

## 2015-02-07 NOTE — Progress Notes (Signed)
Left message for pt to return my call.

## 2015-02-07 NOTE — Progress Notes (Signed)
Pt notified and  verbalized understanding. Has appt already scheduled.

## 2015-02-19 ENCOUNTER — Ambulatory Visit (INDEPENDENT_AMBULATORY_CARE_PROVIDER_SITE_OTHER): Payer: PPO | Admitting: Internal Medicine

## 2015-02-19 ENCOUNTER — Encounter: Payer: Self-pay | Admitting: Internal Medicine

## 2015-02-19 VITALS — BP 146/77 | HR 104 | Temp 97.7°F | Ht 66.0 in | Wt 201.4 lb

## 2015-02-19 DIAGNOSIS — F4322 Adjustment disorder with anxiety: Secondary | ICD-10-CM

## 2015-02-19 DIAGNOSIS — I10 Essential (primary) hypertension: Secondary | ICD-10-CM

## 2015-02-19 DIAGNOSIS — F339 Major depressive disorder, recurrent, unspecified: Secondary | ICD-10-CM | POA: Insufficient documentation

## 2015-02-19 MED ORDER — AMLODIPINE BESYLATE 10 MG PO TABS: 10.0000 mg | ORAL_TABLET | Freq: Every day | ORAL | Status: DC

## 2015-02-19 MED ORDER — FLUOXETINE HCL 20 MG PO TABS: 20.0000 mg | ORAL_TABLET | Freq: Every day | ORAL | Status: DC

## 2015-02-19 MED ORDER — LOSARTAN POTASSIUM 100 MG PO TABS: 100.0000 mg | ORAL_TABLET | Freq: Every day | ORAL | Status: DC

## 2015-02-19 NOTE — Progress Notes (Signed)
Subjective:    Patient ID: Michelle Clark, female    DOB: 1946-06-08, 69 y.o.   MRN: [id number]  HPI 69YO female presents for follow up.  Last visit, added Amlodipine.  Feeling well. No side effects noted from medication.  Note some increased stress recently, caring for husband. Has trouble sleeping at night, wakes frequently with hot flashes. Feels headache at times with stress.   BP Readings from Last 3 Encounters:  02/19/15 146/77  02/06/15 148/88  02/05/15 162/96      Past medical, surgical, family and social history per today's encounter.  Review of Systems  Constitutional: Negative for fever, chills, appetite change, fatigue and unexpected weight change.  Eyes: Negative for visual disturbance.  Respiratory: Negative for shortness of breath.   Cardiovascular: Negative for chest pain, palpitations and leg swelling.  Gastrointestinal: Negative for abdominal pain, diarrhea and constipation.  Skin: Negative for color change and rash.  Neurological: Positive for headaches.  Hematological: Negative for adenopathy. Does not bruise/bleed easily.  Psychiatric/Behavioral: Positive for sleep disturbance and dysphoric mood. The patient is nervous/anxious.        Objective:    BP 146/77 mmHg  Pulse 104  Temp(Src) 97.7 F (36.5 C) (Oral)  Ht 5\' 6"  (1.676 m)  Wt 201 lb 6 oz (91.343 kg)  BMI 32.52 kg/m2  SpO2 99% Physical Exam  Constitutional: She is oriented to person, place, and time. She appears well-developed and well-nourished. No distress.  HENT:  Head: Normocephalic and atraumatic.  Right Ear: External ear normal.  Left Ear: External ear normal.  Nose: Nose normal.  Mouth/Throat: Oropharynx is clear and moist. No oropharyngeal exudate.  Eyes: Conjunctivae are normal. Pupils are equal, round, and reactive to light. Right eye exhibits no discharge. Left eye exhibits no discharge. No scleral icterus.  Neck: Normal range of motion. Neck supple. No tracheal  deviation present. No thyromegaly present.  Cardiovascular: Normal rate, regular rhythm, normal heart sounds and intact distal pulses.  Exam reveals no gallop and no friction rub.   No murmur heard. Pulmonary/Chest: Effort normal and breath sounds normal. No respiratory distress. She has no wheezes. She has no rales. She exhibits no tenderness.  Musculoskeletal: Normal range of motion. She exhibits no edema or tenderness.  Lymphadenopathy:    She has no cervical adenopathy.  Neurological: She is alert and oriented to person, place, and time. No cranial nerve deficit. She exhibits normal muscle tone. Coordination normal.  Skin: Skin is warm and dry. No rash noted. She is not diaphoretic. No erythema. No pallor.  Psychiatric: Her speech is normal and behavior is normal. Judgment and thought content normal. Her mood appears anxious. Cognition and memory are normal. She expresses no suicidal ideation.          Assessment & Plan:   Problem List Items Addressed This Visit      Unprioritized   Adjustment disorder with anxious mood    Recent anxiety likely contributing to elevated BP. Will add Fluoxetine 20mg  daily. Follow up in 4 weeks and prn.      Relevant Medications   FLUoxetine (PROZAC) tablet   Essential hypertension, benign - Primary    BP Readings from Last 3 Encounters:  02/19/15 146/77  02/06/15 148/88  02/05/15 162/96   BP continues to be elevated. Will increase Amlodipine to 10mg  daily. Continue Losartan. Follow up in 4 weeks.      Relevant Medications   losartan (COZAAR) tablet   amLODIpine (NORVASC) tablet  Return in about 4 weeks (around 03/19/2015) for Recheck.

## 2015-02-19 NOTE — Assessment & Plan Note (Signed)
BP Readings from Last 3 Encounters:  02/19/15 146/77  02/06/15 148/88  02/05/15 162/96   BP continues to be elevated. Will increase Amlodipine to 10mg  daily. Continue Losartan. Follow up in 4 weeks.

## 2015-02-19 NOTE — Assessment & Plan Note (Signed)
Recent anxiety likely contributing to elevated BP. Will add Fluoxetine 20mg  daily. Follow up in 4 weeks and prn.

## 2015-02-19 NOTE — Patient Instructions (Signed)
Increase Amlodipine to 10mg  daily.  Add Fluoxetine 20mg  daily.  Follow up in 4 weeks.

## 2015-02-19 NOTE — Progress Notes (Signed)
Pre visit review using our clinic review tool, if applicable. No additional management support is needed unless otherwise documented below in the visit note. 

## 2015-03-03 ENCOUNTER — Ambulatory Visit (INDEPENDENT_AMBULATORY_CARE_PROVIDER_SITE_OTHER): Payer: PPO | Admitting: Internal Medicine

## 2015-03-03 ENCOUNTER — Encounter: Payer: Self-pay | Admitting: Internal Medicine

## 2015-03-03 ENCOUNTER — Telehealth: Payer: Self-pay | Admitting: *Deleted

## 2015-03-03 VITALS — BP 153/81 | HR 115 | Temp 98.7°F | Ht 66.0 in | Wt 199.2 lb

## 2015-03-03 DIAGNOSIS — F4322 Adjustment disorder with anxiety: Secondary | ICD-10-CM

## 2015-03-03 DIAGNOSIS — I10 Essential (primary) hypertension: Secondary | ICD-10-CM

## 2015-03-03 MED ORDER — METOPROLOL SUCCINATE ER 25 MG PO TB24: 25.0000 mg | ORAL_TABLET | Freq: Every day | ORAL | Status: DC

## 2015-03-03 NOTE — Progress Notes (Signed)
Pre visit review using our clinic review tool, if applicable. No additional management support is needed unless otherwise documented below in the visit note. 

## 2015-03-03 NOTE — Telephone Encounter (Signed)
Please advise when we can see her

## 2015-03-03 NOTE — Telephone Encounter (Signed)
Please add pt to the schedule, I already spoke to the pt

## 2015-03-03 NOTE — Patient Instructions (Addendum)
Stop Fluoxetine.  Start Metoprolol 25mg  daily.  Follow up in 1 week.

## 2015-03-03 NOTE — Progress Notes (Signed)
   Subjective:    Patient ID: Michelle Clark, female    DOB: 05/11/1946, 69 y.o.   MRN: [id number]  HPI  69YO female presents for follow up.  Last seen 3/30. Added Fluoxetine to help with anxiety.  Took Fluoxetine for 3 days only. Stopped taking Fluoxetine because it made her feel "foggy." Feeling fine now.   Has not been checking BP at home. No CP, HA, palpitations.  Past medical, surgical, family and social history per today's encounter.  Review of Systems  Constitutional: Negative for fever, chills, appetite change, fatigue and unexpected weight change.  Eyes: Negative for visual disturbance.  Respiratory: Negative for shortness of breath.   Cardiovascular: Negative for chest pain, palpitations and leg swelling.  Gastrointestinal: Negative for abdominal pain.  Skin: Negative for color change and rash.  Neurological: Negative for headaches.  Hematological: Negative for adenopathy. Does not bruise/bleed easily.  Psychiatric/Behavioral: Negative for dysphoric mood. The patient is not nervous/anxious.        Objective:    BP 153/81 mmHg  Pulse 115  Temp(Src) 98.7 F (37.1 C) (Oral)  Ht 5\' 6"  (1.676 m)  Wt 199 lb 4 oz (90.379 kg)  BMI 32.18 kg/m2  SpO2 98% Physical Exam  Constitutional: She is oriented to person, place, and time. She appears well-developed and well-nourished. No distress.  HENT:  Head: Normocephalic and atraumatic.  Right Ear: External ear normal.  Left Ear: External ear normal.  Nose: Nose normal.  Mouth/Throat: Oropharynx is clear and moist. No oropharyngeal exudate.  Eyes: Conjunctivae are normal. Pupils are equal, round, and reactive to light. Right eye exhibits no discharge. Left eye exhibits no discharge. No scleral icterus.  Neck: Normal range of motion. Neck supple. No tracheal deviation present. No thyromegaly present.  Cardiovascular: Normal rate, regular rhythm, normal heart sounds and intact distal pulses.  Exam reveals no gallop and no  friction rub.   No murmur heard. Pulmonary/Chest: Effort normal and breath sounds normal. No respiratory distress. She has no wheezes. She has no rales. She exhibits no tenderness.  Musculoskeletal: Normal range of motion. She exhibits no edema or tenderness.  Lymphadenopathy:    She has no cervical adenopathy.  Neurological: She is alert and oriented to person, place, and time. No cranial nerve deficit. She exhibits normal muscle tone. Coordination normal.  Skin: Skin is warm and dry. No rash noted. She is not diaphoretic. No erythema. No pallor.  Psychiatric: She has a normal mood and affect. Her behavior is normal. Judgment and thought content normal.          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 Will stop medication. Consider adding alternative medication in future for anxiety, if symptoms recurrent, however she now feels that symptoms relatively well controlled.      Essential hypertension, benign    BP Readings from Last 3 Encounters:  03/03/15 153/81  02/19/15 146/77  02/06/15 148/88   BP continues to be elevated. Will add Metoprolol 25mg  daily. Recheck BP in nurse visit this week and in follow up in 1 week.      Relevant Medications   metoprolol succinate (TOPROL-XL) 24 hr tablet       Return in about 1 week (around 03/10/2015) for Recheck of Blood Pressure.

## 2015-03-03 NOTE — Assessment & Plan Note (Signed)
BP Readings from Last 3 Encounters:  03/03/15 153/81  02/19/15 146/77  02/06/15 148/88   BP continues to be elevated. Will add Metoprolol 25mg  daily. Recheck BP in nurse visit this week and in follow up in 1 week.

## 2015-03-03 NOTE — Assessment & Plan Note (Signed)
Unable to tolerate Fluoxetine. Will stop medication. Consider adding alternative medication in future for anxiety, if symptoms recurrent, however she now feels that symptoms relatively well controlled.

## 2015-03-03 NOTE — Telephone Encounter (Signed)
Monday at 1pm for 28min

## 2015-03-10 ENCOUNTER — Encounter: Payer: Self-pay | Admitting: Internal Medicine

## 2015-03-10 ENCOUNTER — Ambulatory Visit (INDEPENDENT_AMBULATORY_CARE_PROVIDER_SITE_OTHER): Payer: PPO | Admitting: Internal Medicine

## 2015-03-10 VITALS — BP 150/75 | HR 99 | Temp 98.1°F | Ht 66.0 in | Wt 198.0 lb

## 2015-03-10 DIAGNOSIS — I10 Essential (primary) hypertension: Secondary | ICD-10-CM | POA: Diagnosis not present

## 2015-03-10 MED ORDER — METOPROLOL SUCCINATE ER 50 MG PO TB24: 50.0000 mg | ORAL_TABLET | Freq: Every day | ORAL | Status: DC

## 2015-03-10 NOTE — Progress Notes (Signed)
Pre visit review using our clinic review tool, if applicable. No additional management support is needed unless otherwise documented below in the visit note. 

## 2015-03-10 NOTE — Progress Notes (Signed)
   Subjective:    Patient ID: Michelle Clark, female    DOB: [birth year], 69 y.o.   MRN: [id number]  HPI 69 YO female presents for follow up.  Feeling well. Started Metoprolol 25mg  daily last Monday. No side effects noted from medication. No chest pain, headache, palpitations.  BP Readings from Last 3 Encounters:  03/10/15 150/75  03/03/15 153/81  02/19/15 146/77      Past medical, surgical, family and social history per today's encounter.  Review of Systems  Constitutional: Negative for fever, chills, appetite change, fatigue and unexpected weight change.  Eyes: Negative for visual disturbance.  Respiratory: Negative for shortness of breath.   Cardiovascular: Negative for chest pain and leg swelling.  Gastrointestinal: Negative for nausea, vomiting, abdominal pain, diarrhea and constipation.  Skin: Negative for color change and rash.  Neurological: Negative for headaches.  Hematological: Negative for adenopathy. Does not bruise/bleed easily.  Psychiatric/Behavioral: Negative for dysphoric mood. The patient is not nervous/anxious.        Objective:    BP 150/75 mmHg  Pulse 99  Temp(Src) 98.1 F (36.7 C) (Oral)  Ht 5\' 6"  (1.676 m)  Wt 198 lb (89.812 kg)  BMI 31.97 kg/m2  SpO2 99% Physical Exam  Constitutional: She is oriented to person, place, and time. She appears well-developed and well-nourished. No distress.  HENT:  Head: Normocephalic and atraumatic.  Right Ear: External ear normal.  Left Ear: External ear normal.  Nose: Nose normal.  Mouth/Throat: Oropharynx is clear and moist. No oropharyngeal exudate.  Eyes: Conjunctivae are normal. Pupils are equal, round, and reactive to light. Right eye exhibits no discharge. Left eye exhibits no discharge. No scleral icterus.  Neck: Normal range of motion. Neck supple. No tracheal deviation present. No thyromegaly present.  Cardiovascular: Normal rate, regular rhythm, normal heart sounds and intact distal pulses.  Exam  reveals no gallop and no friction rub.   No murmur heard. Pulmonary/Chest: Effort normal and breath sounds normal. No respiratory distress. She has no wheezes. She has no rales. She exhibits no tenderness.  Musculoskeletal: Normal range of motion. She exhibits no edema or tenderness.  Lymphadenopathy:    She has no cervical adenopathy.  Neurological: She is alert and oriented to person, place, and time. No cranial nerve deficit. She exhibits normal muscle tone. Coordination normal.  Skin: Skin is warm and dry. No rash noted. She is not diaphoretic. No erythema. No pallor.  Psychiatric: She has a normal mood and affect. Her behavior is normal. Judgment and thought content normal.          Assessment & Plan:   Problem List Items Addressed This Visit      Unprioritized   Essential hypertension, benign - Primary    BP Readings from Last 3 Encounters:  03/10/15 150/75  03/03/15 153/81  02/19/15 146/77   BP slightly elevated, but much improved compared to previous. Will increase Metoprolol to 50mg  daily. Follow up in 4 weeks and prn.      Relevant Medications   metoprolol succinate (TOPROL-XL) 50 MG 24 hr tablet       Return in about 4 weeks (around 04/07/2015) for Recheck of Blood Pressure.

## 2015-03-10 NOTE — Patient Instructions (Addendum)
Please increase Metoprolol to 50mg  daily.  Continue Amlodipine, Losartan.

## 2015-03-10 NOTE — Assessment & Plan Note (Signed)
BP Readings from Last 3 Encounters:  03/10/15 150/75  03/03/15 153/81  02/19/15 146/77   BP slightly elevated, but much improved compared to previous. Will increase Metoprolol to 50mg  daily. Follow up in 4 weeks and prn.

## 2015-03-12 ENCOUNTER — Ambulatory Visit
Admit: 2015-03-12 | Disposition: A | Discharge status: Home / Self Care | Payer: Self-pay | Attending: Unknown Physician Specialty | Admitting: Unknown Physician Specialty

## 2015-03-12 LAB — HM COLONOSCOPY: HM COLON: NORMAL

## 2015-03-16 NOTE — Consult Note (Signed)
PATIENT NAME:  Michelle Clark, Michelle Clark MR#:  [id number] DATE OF BIRTH:  Dec 10, 1945  DATE OF CONSULTATION:  10/17/2011  REFERRING PHYSICIAN:   CONSULTING PHYSICIAN:  Manya Silvas, MD  HISTORY OF PRESENT ILLNESS: Patient is a 69 year old black female who was seen in the ER a couple of days ago with a white count of 19,000, crampy abdominal pain. She had passed a small amount of blood, not bright red and not black, with bowel movements and was seen in the ER, had white count of 19,000 and came back for re-evaluation today.   Patient says she is feeling much better today and she is not having pain at the moment. She denies any fever, chills, nausea, vomiting. She took Dulcolax pills because of some x-ray showing a lot of stool after her visit two days ago and had a good bowel movements and felt better. Her last colonoscopy was about seven years ago with me.   FAMILY HISTORY: Negative for colon cancer.   PERSONAL HISTORY: Positive for breast cancer.   PAST SURGICAL HISTORY:  1. Gallbladder removal.  2. Hysterectomy.   PHYSICAL EXAMINATION:  GENERAL: Black female in no acute distress.   VITAL SIGNS: Temperature 97.8, pulse 105, blood pressure 137/88.   ABDOMEN: Somewhat obese. No hepatosplenomegaly. No masses. No bruits. No significant tenderness.   LABORATORY, DIAGNOSTIC AND RADIOLOGICAL DATA: Today her white count is significantly improved with white count of 9.9, hemoglobin 13. Urinalysis essentially unremarkable. Alkaline phosphatase 138, SGPT 95, SGOT 59, BUN 12, creatinine 0.8. Ultrasound of the abdomen a couple of days ago no abnormalities.   ASSESSMENT: Impossible to say for sure what was causing her crampy abdominal pain but passing some blood previously it is important to further evaluate her with colonoscopy. We will see her in the office in two days and arrange this. Patient is in agreement with follow up. Since she is feeling much better at this time I think no further tests at this time  are indicated. We will need to follow up her mildly elevated transaminases, may well be due to fatty liver. Patient will be seen Tuesday at 1:00 p.m.   ____________________________ Manya Silvas, MD rte:cms D: 10/17/2011 14:19:31 ET T: 10/17/2011 14:40:19 ET JOB#: [id number]  cc: Manya Silvas, MD, <Dictator> Manya Silvas MD ELECTRONICALLY SIGNED 11/26/2011 19:47

## 2015-03-17 LAB — SURGICAL PATHOLOGY

## 2015-03-19 ENCOUNTER — Encounter: Payer: Self-pay | Admitting: *Deleted

## 2015-04-03 ENCOUNTER — Encounter: Payer: Self-pay | Admitting: Internal Medicine

## 2015-04-15 ENCOUNTER — Encounter: Payer: Self-pay | Admitting: Internal Medicine

## 2015-04-15 ENCOUNTER — Ambulatory Visit (INDEPENDENT_AMBULATORY_CARE_PROVIDER_SITE_OTHER): Payer: PPO | Admitting: Internal Medicine

## 2015-04-15 VITALS — BP 120/73 | HR 95 | Temp 98.3°F | Ht 66.0 in | Wt 200.2 lb

## 2015-04-15 DIAGNOSIS — I10 Essential (primary) hypertension: Secondary | ICD-10-CM | POA: Diagnosis not present

## 2015-04-15 DIAGNOSIS — R609 Edema, unspecified: Secondary | ICD-10-CM

## 2015-04-15 LAB — COMPREHENSIVE METABOLIC PANEL
ALT: 39 U/L — ABNORMAL HIGH (ref 0–35)
AST: 29 U/L (ref 0–37)
Albumin: 4.5 g/dL (ref 3.5–5.2)
Alkaline Phosphatase: 119 U/L — ABNORMAL HIGH (ref 39–117)
BUN: 20 mg/dL (ref 6–23)
CHLORIDE: 99 meq/L (ref 96–112)
CO2: 30 mEq/L (ref 19–32)
Calcium: 9.8 mg/dL (ref 8.4–10.5)
Creatinine, Ser: 0.79 mg/dL (ref 0.40–1.20)
GFR: 92.46 mL/min (ref >60.00)
GLUCOSE: 88 mg/dL (ref 70–99)
Potassium: 4 mEq/L (ref 3.5–5.1)
Sodium: 136 mEq/L (ref 135–145)
Total Bilirubin: 0.5 mg/dL (ref 0.2–1.2)
Total Protein: 7.6 g/dL (ref 6.0–8.3)

## 2015-04-15 NOTE — Patient Instructions (Addendum)
Try changing Metoprolol to bedtime.  Look into Med[employer] Compression Hose, knee high. You can find these at [facility].

## 2015-04-15 NOTE — Assessment & Plan Note (Addendum)
BP Readings from Last 3 Encounters:  04/15/15 120/73  03/10/15 150/75  03/03/15 153/81   BP much improved on current regimen.  Renal function with labs today. Continue current medication. She is having some fatigue which may be related to use of betablocker. May consider change to Carvedilol if this persists.

## 2015-04-15 NOTE — Progress Notes (Signed)
   Subjective:    Patient ID: Michelle Clark, female    DOB: 1946/02/21, 69 y.o.   MRN: [id number]  HPI  69YO female presents for follow up.  Last seen 4/18. Metoprolol increased to 50mg  daily.  Feeling fatigued during daytime. No focal symptoms such as chest pain, dyspnea, change in bowel habits. Also notes some swelling in ankles and lower legs which is more prominent in the afternoons and improves over night.  Past medical, surgical, family and social history per today's encounter.  Review of Systems  Constitutional: Positive for fatigue. Negative for fever, chills, appetite change and unexpected weight change.  Eyes: Negative for visual disturbance.  Respiratory: Negative for shortness of breath.   Cardiovascular: Positive for leg swelling. Negative for chest pain.  Gastrointestinal: Negative for abdominal pain.  Skin: Negative for color change and rash.  Hematological: Negative for adenopathy. Does not bruise/bleed easily.  Psychiatric/Behavioral: Negative for dysphoric mood. The patient is not nervous/anxious.        Objective:    BP 120/73 mmHg  Pulse 95  Temp(Src) 98.3 F (36.8 C) (Oral)  Ht 5\' 6"  (1.676 m)  Wt 200 lb 4 oz (90.833 kg)  BMI 32.34 kg/m2  SpO2 98% Physical Exam  Constitutional: She is oriented to person, place, and time. She appears well-developed and well-nourished. No distress.  HENT:  Head: Normocephalic and atraumatic.  Right Ear: External ear normal.  Left Ear: External ear normal.  Nose: Nose normal.  Mouth/Throat: Oropharynx is clear and moist. No oropharyngeal exudate.  Eyes: Conjunctivae are normal. Pupils are equal, round, and reactive to light. Right eye exhibits no discharge. Left eye exhibits no discharge. No scleral icterus.  Neck: Normal range of motion. Neck supple. No tracheal deviation present. No thyromegaly present.  Cardiovascular: Normal rate, regular rhythm, normal heart sounds and intact distal pulses.  Exam reveals no  gallop and no friction rub.   No murmur heard. Pulmonary/Chest: Effort normal and breath sounds normal. No respiratory distress. She has no wheezes. She has no rales. She exhibits no tenderness.  Musculoskeletal: Normal range of motion. She exhibits no edema or tenderness.  Lymphadenopathy:    She has no cervical adenopathy.  Neurological: She is alert and oriented to person, place, and time. No cranial nerve deficit. She exhibits normal muscle tone. Coordination normal.  Skin: Skin is warm and dry. No rash noted. She is not diaphoretic. No erythema. No pallor.  Psychiatric: She has a normal mood and affect. Her behavior is normal. Judgment and thought content normal.          Assessment & Plan:   Problem List Items Addressed This Visit      Unprioritized   Edema    Edema noted by pt in the evenings, however exam normal today. Encouraged her to use compression stockings. Discussed that Amlodipine may worsen swelling and we may need to reduce dose or DC if symptoms continue.      Essential hypertension, benign - Primary    BP Readings from Last 3 Encounters:  04/15/15 120/73  03/10/15 150/75  03/03/15 153/81   BP much improved on current regimen.  Renal function with labs today. Continue current medication. She is having some fatigue which may be related to use of betablocker. May consider change to Carvedilol if this persists.      Relevant Orders   Comprehensive metabolic panel       Return in about 5 months (around 09/15/2015) for Wellness Visit.

## 2015-04-15 NOTE — Progress Notes (Signed)
Pre visit review using our clinic review tool, if applicable. No additional management support is needed unless otherwise documented below in the visit note. 

## 2015-04-15 NOTE — Assessment & Plan Note (Signed)
Edema noted by pt in the evenings, however exam normal today. Encouraged her to use compression stockings. Discussed that Amlodipine may worsen swelling and we may need to reduce dose or DC if symptoms continue.

## 2015-04-23 ENCOUNTER — Ambulatory Visit (INDEPENDENT_AMBULATORY_CARE_PROVIDER_SITE_OTHER): Payer: PPO | Admitting: Internal Medicine

## 2015-04-23 ENCOUNTER — Encounter: Payer: Self-pay | Admitting: Internal Medicine

## 2015-04-23 VITALS — BP 138/75 | HR 96 | Temp 98.3°F | Ht 66.0 in | Wt 201.4 lb

## 2015-04-23 DIAGNOSIS — I10 Essential (primary) hypertension: Secondary | ICD-10-CM

## 2015-04-23 DIAGNOSIS — R609 Edema, unspecified: Secondary | ICD-10-CM | POA: Diagnosis not present

## 2015-04-23 MED ORDER — HYDROCHLOROTHIAZIDE 12.5 MG PO CAPS: 12.5000 mg | ORAL_CAPSULE | Freq: Every day | ORAL | Status: DC

## 2015-04-23 MED ORDER — AMLODIPINE BESYLATE 5 MG PO TABS: 5.0000 mg | ORAL_TABLET | Freq: Every day | ORAL | Status: DC

## 2015-04-23 MED ORDER — SIMVASTATIN 20 MG PO TABS: 20.0000 mg | ORAL_TABLET | Freq: Every day | ORAL | Status: DC

## 2015-04-23 NOTE — Assessment & Plan Note (Signed)
BP Readings from Last 3 Encounters:  04/23/15 138/75  04/15/15 120/73  03/10/15 150/75   BP well controlled. However having some LEE with amlodipine. Will decrease dose to 5mg  and add back HCTZ. Recheck renal function in 1 week.

## 2015-04-23 NOTE — Progress Notes (Signed)
Pre visit review using our clinic review tool, if applicable. No additional management support is needed unless otherwise documented below in the visit note. 

## 2015-04-23 NOTE — Progress Notes (Signed)
   Subjective:    Patient ID: Michelle Clark, female    DOB: [birth year], 69 y.o.   MRN: [id number]  HPI  69YO female presents for acute visit.  Concerned about worsening swelling in her lower legs bilaterally. Improves some overnight, but does not completely resolve. No chest pain, palpitations, dyspnea.  Past medical, surgical, family and social history per today's encounter.  Review of Systems  Constitutional: Negative for fever, chills, appetite change, fatigue and unexpected weight change.  Eyes: Negative for visual disturbance.  Respiratory: Negative for cough, chest tightness and shortness of breath.   Cardiovascular: Positive for leg swelling. Negative for chest pain and palpitations.  Gastrointestinal: Negative for abdominal pain.  Skin: Negative for color change and rash.  Hematological: Negative for adenopathy. Does not bruise/bleed easily.  Psychiatric/Behavioral: Negative for dysphoric mood. The patient is not nervous/anxious.        Objective:    BP 138/75 mmHg  Pulse 96  Temp(Src) 98.3 F (36.8 C) (Oral)  Ht 5\' 6"  (1.676 m)  Wt 201 lb 6 oz (91.343 kg)  BMI 32.52 kg/m2  SpO2 99% Physical Exam  Constitutional: She is oriented to person, place, and time. She appears well-developed and well-nourished. No distress.  HENT:  Head: Normocephalic and atraumatic.  Right Ear: External ear normal.  Left Ear: External ear normal.  Nose: Nose normal.  Mouth/Throat: Oropharynx is clear and moist. No oropharyngeal exudate.  Eyes: Conjunctivae are normal. Pupils are equal, round, and reactive to light. Right eye exhibits no discharge. Left eye exhibits no discharge. No scleral icterus.  Neck: Normal range of motion. Neck supple. No tracheal deviation present. No thyromegaly present.  Cardiovascular: Normal rate, regular rhythm, normal heart sounds and intact distal pulses.  Exam reveals no gallop and no friction rub.   No murmur heard. Pulmonary/Chest: Effort normal and  breath sounds normal. No respiratory distress. She has no wheezes. She has no rales. She exhibits no tenderness.  Musculoskeletal: Normal range of motion. She exhibits edema (pitting to mid calf). She exhibits no tenderness.  Lymphadenopathy:    She has no cervical adenopathy.  Neurological: She is alert and oriented to person, place, and time. No cranial nerve deficit. She exhibits normal muscle tone. Coordination normal.  Skin: Skin is warm and dry. No rash noted. She is not diaphoretic. No erythema. No pallor.  Psychiatric: She has a normal mood and affect. Her behavior is normal. Judgment and thought content normal.          Assessment & Plan:   Problem List Items Addressed This Visit      Unprioritized   Edema    Secondary to use of Amlodipine. Will decrease dose to 5mg  daily. Add HCTZ. Follow up recheck in 1 week.      Essential hypertension, benign - Primary    BP Readings from Last 3 Encounters:  04/23/15 138/75  04/15/15 120/73  03/10/15 150/75   BP well controlled. However having some LEE with amlodipine. Will decrease dose to 5mg  and add back HCTZ. Recheck renal function in 1 week.      Relevant Medications   amLODipine (NORVASC) 5 MG tablet   simvastatin (ZOCOR) 20 MG tablet   hydrochlorothiazide (MICROZIDE) 12.5 MG capsule   Other Relevant Orders   Basic Metabolic Panel (BMET)       Return in about 1 week (around 04/30/2015).

## 2015-04-23 NOTE — Patient Instructions (Signed)
Decrease dose of Amlodipine to 5mg  daily.  Add HCTZ 12.5mg  daily.  Labs in 1 week.

## 2015-04-23 NOTE — Assessment & Plan Note (Signed)
Secondary to use of Amlodipine. Will decrease dose to 5mg  daily. Add HCTZ. Follow up recheck in 1 week.

## 2015-07-08 ENCOUNTER — Other Ambulatory Visit: Payer: Self-pay

## 2015-07-10 ENCOUNTER — Telehealth: Payer: Self-pay

## 2015-07-10 NOTE — Telephone Encounter (Signed)
This pt pharmacy has sent in a RX refill request for furosemide 40 mg tab #30.  This medication is not on her meds list and i looked to seen when it was discontinued and was unable to find anything for this year. Please advise?

## 2015-07-10 NOTE — Telephone Encounter (Signed)
I do not think that she is taking this medication, however a cardiologist may have restarted it. Can you confirm with her.

## 2015-07-11 ENCOUNTER — Other Ambulatory Visit: Payer: Self-pay

## 2015-07-11 MED ORDER — FUROSEMIDE 40 MG PO TABS: 40.0000 mg | ORAL_TABLET | Freq: Every day | ORAL | Status: DC

## 2015-07-11 NOTE — Telephone Encounter (Signed)
Fine to refill 

## 2015-07-11 NOTE — Telephone Encounter (Signed)
Pt states that you told her it was okay to take as needed.  Okay to refill?

## 2015-09-16 ENCOUNTER — Ambulatory Visit (INDEPENDENT_AMBULATORY_CARE_PROVIDER_SITE_OTHER): Payer: PPO | Admitting: Internal Medicine

## 2015-09-16 ENCOUNTER — Other Ambulatory Visit
Admission: RE | Admit: 2015-09-16 | Discharge: 2015-09-16 | Disposition: A | Discharge status: Home / Self Care | Payer: PPO | Source: Ambulatory Visit | Attending: Internal Medicine | Admitting: Internal Medicine

## 2015-09-16 ENCOUNTER — Encounter: Payer: Self-pay | Admitting: Internal Medicine

## 2015-09-16 VITALS — BP 145/90 | HR 93 | Temp 98.4°F | Ht 65.5 in | Wt 203.2 lb

## 2015-09-16 DIAGNOSIS — Z0000 Encounter for general adult medical examination without abnormal findings: Secondary | ICD-10-CM | POA: Insufficient documentation

## 2015-09-16 DIAGNOSIS — Z1239 Encounter for other screening for malignant neoplasm of breast: Secondary | ICD-10-CM | POA: Insufficient documentation

## 2015-09-16 DIAGNOSIS — E669 Obesity, unspecified: Secondary | ICD-10-CM

## 2015-09-16 DIAGNOSIS — Z23 Encounter for immunization: Secondary | ICD-10-CM | POA: Diagnosis not present

## 2015-09-16 DIAGNOSIS — E785 Hyperlipidemia, unspecified: Secondary | ICD-10-CM

## 2015-09-16 DIAGNOSIS — I10 Essential (primary) hypertension: Secondary | ICD-10-CM

## 2015-09-16 LAB — CBC WITH DIFFERENTIAL/PLATELET
BASOS ABS: 0.1 10*3/uL (ref 0–0.1)
BASOS PCT: 1 %
EOS PCT: 3 %
Eosinophils Absolute: 0.3 10*3/uL (ref 0–0.7)
HEMATOCRIT: 41.1 % (ref 35.0–47.0)
Hemoglobin: 13.9 g/dL (ref 12.0–16.0)
LYMPHS PCT: 46 %
Lymphs Abs: 3.6 10*3/uL (ref 1.0–3.6)
MCH: 28.4 pg (ref 26.0–34.0)
MCHC: 33.7 g/dL (ref 32.0–36.0)
MCV: 84.1 fL (ref 80.0–100.0)
MONO ABS: 0.6 10*3/uL (ref 0.2–0.9)
Monocytes Relative: 8 %
NEUTROS ABS: 3.3 10*3/uL (ref 1.4–6.5)
Neutrophils Relative %: 42 %
PLATELETS: 227 10*3/uL (ref 150–440)
RBC: 4.89 MIL/uL (ref 3.80–5.20)
RDW: 14.5 % (ref 11.5–14.5)
WBC: 7.7 10*3/uL (ref 3.6–11.0)

## 2015-09-16 LAB — COMPREHENSIVE METABOLIC PANEL
ALK PHOS: 91 U/L (ref 38–126)
ALT: 24 U/L (ref 14–54)
ANION GAP: 8 (ref 5–15)
AST: 27 U/L (ref 15–41)
Albumin: 4.6 g/dL (ref 3.5–5.0)
BILIRUBIN TOTAL: 0.6 mg/dL (ref 0.3–1.2)
BUN: 17 mg/dL (ref 6–20)
CO2: 27 mmol/L (ref 22–32)
Calcium: 9.8 mg/dL (ref 8.9–10.3)
Chloride: 106 mmol/L (ref 101–111)
Creatinine, Ser: 0.77 mg/dL (ref 0.44–1.00)
GFR calc Af Amer: >60 mL/min (ref >60)
Glucose, Bld: 96 mg/dL (ref 65–99)
POTASSIUM: 3.8 mmol/L (ref 3.5–5.1)
Sodium: 141 mmol/L (ref 135–145)
TOTAL PROTEIN: 8.4 g/dL — AB (ref 6.5–8.1)

## 2015-09-16 LAB — LIPID PANEL
CHOL/HDL RATIO: 2.7 ratio
Cholesterol: 172 mg/dL (ref 0–200)
HDL: 63 mg/dL (ref >40)
LDL Cholesterol: 98 mg/dL (ref 0–99)
TRIGLYCERIDES: 56 mg/dL (ref <150)
VLDL: 11 mg/dL (ref 0–40)

## 2015-09-16 NOTE — Progress Notes (Signed)
Pre visit review using our clinic review tool, if applicable. No additional management support is needed unless otherwise documented below in the visit note. 

## 2015-09-16 NOTE — Progress Notes (Signed)
Subjective:    Patient ID: Michelle Clark, female    DOB: 1946-03-30, 69 y.o.   MRN: [id number]  HPI  The patient is here for annual Medicare wellness examination and management of other chronic and acute problems.   The risk factors are reflected in the social history.  The roster of all physicians providing medical care to patient - is listed in the Snapshot section of the chart.  Activities of daily living:  The patient is 100% independent in all ADLs: dressing, toileting, feeding as well as independent mobility. Lives with husband, and she provides care for him. No pets in home.  Home safety : The patient has smoke detectors in the home. They wear seatbelts.  There are no firearms at home. There is no violence in the home.   There is no risks for hepatitis, STDs or HIV. There is no history of blood transfusion. They have no travel history to infectious disease endemic areas of the world.  The patient has not seen their dentist in the last six month. She does not have a dentist. They have seen their eye doctor in the last year. Ophthalmology - [facility], Dr. George Ina  They have deferred audiologic testing in the last year.  They do not  have excessive sun exposure. Discussed the need for sun protection: hats, long sleeves and use of sunscreen if there is significant sun exposure.  Cardiology - Dr. Clayborn Bigness  Diet: the importance of a healthy diet is discussed. They do have a healthy diet.  The benefits of regular aerobic exercise were discussed. She has not been exercising.  Depression screen: there are no signs or vegative symptoms of depression- irritability, change in appetite, anhedonia, sadness/tearfullness.  No recent falls.  Cognitive assessment: the patient manages all their financial and personal affairs and is actively engaged. They could relate day,date,year and events.  She continues to be the primary caregiver for her husband.  HCPOA - none in place.  The  following portions of the patient's history were reviewed and updated as appropriate: allergies, current medications, past family history, past medical history,  past surgical history, past social history  and problem list.  Visual acuity was not assessed per patient preference since she has regular follow up with her ophthalmologist. Hearing and body mass index were assessed and reviewed.   During the course of the visit the patient was educated and counseled about appropriate screening and preventive services including : fall prevention , diabetes screening, nutrition counseling, colorectal cancer screening, and recommended immunizations.      Wt Readings from Last 3 Encounters:  09/16/15 203 lb 4 oz (92.194 kg)  04/23/15 201 lb 6 oz (91.343 kg)  04/15/15 200 lb 4 oz (90.833 kg)   BP Readings from Last 3 Encounters:  09/16/15 145/90  04/23/15 138/75  04/15/15 120/73    Past Medical History  Diagnosis Date  . Hyperlipidemia   . Hypertension   . Colon polyps   . Cancer [facility]) 2001    Right breast, s/p chemotherapy, XRT and mastectomy, Dr. Jeb Levering   Family History  Problem Relation Age of Onset  . Arthritis Mother   . Arthritis Sister   . Stroke Sister     paralyzed left side  . Cancer Sister     breast  . Heart disease Brother   . Asthma Brother    Past Surgical History  Procedure Laterality Date  . Appendectomy    . Cholecystectomy    . Breast surgery  2002    mastectomy  . Tubal ligation    . Abdominal hysterectomy  1998    cervix intact   Social History   Social History  . Marital Status: Married    Spouse Name: N/A  . Number of Children: N/A  . Years of Education: N/A   Social History Main Topics  . Smoking status: Former Smoker    Quit date: 04/10/1997  . Smokeless tobacco: Never Used  . Alcohol Use: 0.6 oz/week    1 Glasses of wine per week  . Drug Use: No  . Sexual Activity: Not Asked   Other Topics Concern  . None   Social History Narrative    Lives in [place] with husband. No pets      Work - retired, Surveyor, minerals      Diet - regular      Exercise - none    Review of Systems  Constitutional: Negative for fever, chills, appetite change, fatigue and unexpected weight change.  Eyes: Negative for visual disturbance.  Respiratory: Negative for shortness of breath.   Cardiovascular: Negative for chest pain and leg swelling.  Gastrointestinal: Negative for nausea, vomiting, abdominal pain, diarrhea and constipation.  Musculoskeletal: Negative for myalgias and arthralgias.  Skin: Negative for color change and rash.  Hematological: Negative for adenopathy. Does not bruise/bleed easily.  Psychiatric/Behavioral: Negative for sleep disturbance and dysphoric mood. The patient is not nervous/anxious.        Objective:    BP 145/90 mmHg  Pulse 93  Temp(Src) 98.4 F (36.9 C) (Oral)  Ht 5' 5.5" (1.664 m)  Wt 203 lb 4 oz (92.194 kg)  BMI 33.30 kg/m2  SpO2 99% Physical Exam  Constitutional: She is oriented to person, place, and time. She appears well-developed and well-nourished. No distress.  HENT:  Head: Normocephalic and atraumatic.  Right Ear: External ear normal.  Left Ear: External ear normal.  Nose: Nose normal.  Mouth/Throat: Oropharynx is clear and moist. No oropharyngeal exudate.  Eyes: Conjunctivae are normal. Pupils are equal, round, and reactive to light. Right eye exhibits no discharge. Left eye exhibits no discharge. No scleral icterus.  Neck: Normal range of motion. Neck supple. No tracheal deviation present. No thyromegaly present.  Cardiovascular: Normal rate, regular rhythm, normal heart sounds and intact distal pulses.  Exam reveals no gallop and no friction rub.   No murmur heard. Pulmonary/Chest: Effort normal and breath sounds normal. No accessory muscle usage. No tachypnea. No respiratory distress. She has no decreased breath sounds. She has no wheezes. She has no rhonchi. She has no rales. She  exhibits no tenderness. Right breast exhibits no skin change. Left breast exhibits no inverted nipple, no mass, no nipple discharge, no skin change and no tenderness. Breasts are asymmetrical.    Abdominal: Soft. Bowel sounds are normal. She exhibits no distension and no mass. There is no tenderness. There is no rebound and no guarding.  Musculoskeletal: Normal range of motion. She exhibits no edema or tenderness.  Lymphadenopathy:    She has no cervical adenopathy.  Neurological: She is alert and oriented to person, place, and time. No cranial nerve deficit. She exhibits normal muscle tone. Coordination normal.  Skin: Skin is warm and dry. No rash noted. She is not diaphoretic. No erythema. No pallor.  Psychiatric: She has a normal mood and affect. Her behavior is normal. Judgment and thought content normal.          Assessment & Plan:   Problem List Items Addressed This  Visit      Unprioritized   Essential hypertension, benign   Relevant Orders   CBC with Differential/Platelet   Comprehensive metabolic panel   Microalbumin / creatinine urine ratio   Hyperlipidemia   Relevant Orders   Lipid panel   Medicare annual wellness visit, subsequent - Primary    General medical exam normal today including breast exam. PAP and pelvic deferred given age. Labs today as ordered. Flu vaccine today. Colonoscopy is UTD. Mammogram ordered. Encouraged her to be more physically active. Encouraged healthy diet.      Obesity (BMI 30-39.9)    Wt Readings from Last 3 Encounters:  09/16/15 203 lb 4 oz (92.194 kg)  04/23/15 201 lb 6 oz (91.343 kg)  04/15/15 200 lb 4 oz (90.833 kg)    Encouraged healthy diet and exercise. Encouraged her to set a goal of starting walking 28min daily.      Screening for breast cancer   Relevant Orders   MM Digital Screening       Return in about 6 months (around 03/16/2016) for Recheck.

## 2015-09-16 NOTE — Assessment & Plan Note (Signed)
Wt Readings from Last 3 Encounters:  09/16/15 203 lb 4 oz (92.194 kg)  04/23/15 201 lb 6 oz (91.343 kg)  04/15/15 200 lb 4 oz (90.833 kg)    Encouraged healthy diet and exercise. Encouraged her to set a goal of starting walking 73min daily.

## 2015-09-16 NOTE — Patient Instructions (Signed)
Health Maintenance, Female Adopting a healthy lifestyle and getting preventive care can go a long way to promote health and wellness. Talk with your health care provider about what schedule of regular examinations is right for you. This is a good chance for you to check in with your provider about disease prevention and staying healthy. In between checkups, there are plenty of things you can do on your own. Experts have done a lot of research about which lifestyle changes and preventive measures are most likely to keep you healthy. Ask your health care provider for more information. WEIGHT AND DIET  Eat a healthy diet  Be sure to include plenty of vegetables, fruits, low-fat dairy products, and lean protein.  Do not eat a lot of foods high in solid fats, added sugars, or salt.  Get regular exercise. This is one of the most important things you can do for your health.  Most adults should exercise for at least 150 minutes each week. The exercise should increase your heart rate and make you sweat (moderate-intensity exercise).  Most adults should also do strengthening exercises at least twice a week. This is in addition to the moderate-intensity exercise.  Maintain a healthy weight  Body mass index (BMI) is a measurement that can be used to identify possible weight problems. It estimates body fat based on height and weight. Your health care provider can help determine your BMI and help you achieve or maintain a healthy weight.  For females 20 years of age and older:   A BMI below 18.5 is considered underweight.  A BMI of 18.5 to 24.9 is normal.  A BMI of 25 to 29.9 is considered overweight.  A BMI of 30 and above is considered obese.  Watch levels of cholesterol and blood lipids  You should start having your blood tested for lipids and cholesterol at 70 years of age, then have this test every 5 years.  You may need to have your cholesterol levels checked more often if:  Your lipid  or cholesterol levels are high.  You are older than 70 years of age.  You are at high risk for heart disease.  CANCER SCREENING   Lung Cancer  Lung cancer screening is recommended for adults 55-80 years old who are at high risk for lung cancer because of a history of smoking.  A yearly low-dose CT scan of the lungs is recommended for people who:  Currently smoke.  Have quit within the past 15 years.  Have at least a 30-pack-year history of smoking. A pack year is smoking an average of one pack of cigarettes a day for 1 year.  Yearly screening should continue until it has been 15 years since you quit.  Yearly screening should stop if you develop a health problem that would prevent you from having lung cancer treatment.  Breast Cancer  Practice breast self-awareness. This means understanding how your breasts normally appear and feel.  It also means doing regular breast self-exams. Let your health care provider know about any changes, no matter how small.  If you are in your 20s or 30s, you should have a clinical breast exam (CBE) by a health care provider every 1-3 years as part of a regular health exam.  If you are 40 or older, have a CBE every year. Also consider having a breast X-ray (mammogram) every year.  If you have a family history of breast cancer, talk to your health care provider about genetic screening.  If you   are at high risk for breast cancer, talk to your health care provider about having an MRI and a mammogram every year.  Breast cancer gene (BRCA) assessment is recommended for women who have family members with BRCA-related cancers. BRCA-related cancers include:  Breast.  Ovarian.  Tubal.  Peritoneal cancers.  Results of the assessment will determine the need for genetic counseling and BRCA1 and BRCA2 testing. Cervical Cancer Your health care provider may recommend that you be screened regularly for cancer of the pelvic organs (ovaries, uterus, and  vagina). This screening involves a pelvic examination, including checking for microscopic changes to the surface of your cervix (Pap test). You may be encouraged to have this screening done every 3 years, beginning at age 21.  For women ages 30-65, health care providers may recommend pelvic exams and Pap testing every 3 years, or they may recommend the Pap and pelvic exam, combined with testing for human papilloma virus (HPV), every 5 years. Some types of HPV increase your risk of cervical cancer. Testing for HPV may also be done on women of any age with unclear Pap test results.  Other health care providers may not recommend any screening for nonpregnant women who are considered low risk for pelvic cancer and who do not have symptoms. Ask your health care provider if a screening pelvic exam is right for you.  If you have had past treatment for cervical cancer or a condition that could lead to cancer, you need Pap tests and screening for cancer for at least 20 years after your treatment. If Pap tests have been discontinued, your risk factors (such as having a new sexual partner) need to be reassessed to determine if screening should resume. Some women have medical problems that increase the chance of getting cervical cancer. In these cases, your health care provider may recommend more frequent screening and Pap tests. Colorectal Cancer  This type of cancer can be detected and often prevented.  Routine colorectal cancer screening usually begins at 70 years of age and continues through 70 years of age.  Your health care provider may recommend screening at an earlier age if you have risk factors for colon cancer.  Your health care provider may also recommend using home test kits to check for hidden blood in the stool.  A small camera at the end of a tube can be used to examine your colon directly (sigmoidoscopy or colonoscopy). This is done to check for the earliest forms of colorectal  cancer.  Routine screening usually begins at age 50.  Direct examination of the colon should be repeated every 5-10 years through 70 years of age. However, you may need to be screened more often if early forms of precancerous polyps or small growths are found. Skin Cancer  Check your skin from head to toe regularly.  Tell your health care provider about any new moles or changes in moles, especially if there is a change in a mole's shape or color.  Also tell your health care provider if you have a mole that is larger than the size of a pencil eraser.  Always use sunscreen. Apply sunscreen liberally and repeatedly throughout the day.  Protect yourself by wearing long sleeves, pants, a wide-brimmed hat, and sunglasses whenever you are outside. HEART DISEASE, DIABETES, AND HIGH BLOOD PRESSURE   High blood pressure causes heart disease and increases the risk of stroke. High blood pressure is more likely to develop in:  People who have blood pressure in the high end   of the normal range (130-139/85-89 mm Hg).  People who are overweight or obese.  People who are African American.  If you are 38-23 years of age, have your blood pressure checked every 3-5 years. If you are 61 years of age or older, have your blood pressure checked every year. You should have your blood pressure measured twice--once when you are at a hospital or clinic, and once when you are not at a hospital or clinic. Record the average of the two measurements. To check your blood pressure when you are not at a hospital or clinic, you can use:  An automated blood pressure machine at a pharmacy.  A home blood pressure monitor.  If you are between 45 years and 39 years old, ask your health care provider if you should take aspirin to prevent strokes.  Have regular diabetes screenings. This involves taking a blood sample to check your fasting blood sugar level.  If you are at a normal weight and have a low risk for diabetes,  have this test once every three years after 70 years of age.  If you are overweight and have a high risk for diabetes, consider being tested at a younger age or more often. PREVENTING INFECTION  Hepatitis B  If you have a higher risk for hepatitis B, you should be screened for this virus. You are considered at high risk for hepatitis B if:  You were born in a country where hepatitis B is common. Ask your health care provider which countries are considered high risk.  Your parents were born in a high-risk country, and you have not been immunized against hepatitis B (hepatitis B vaccine).  You have HIV or AIDS.  You use needles to inject street drugs.  You live with someone who has hepatitis B.  You have had sex with someone who has hepatitis B.  You get hemodialysis treatment.  You take certain medicines for conditions, including cancer, organ transplantation, and autoimmune conditions. Hepatitis C  Blood testing is recommended for:  Everyone born from [birth year] through 1966.  Anyone with known risk factors for hepatitis C. Sexually transmitted infections (STIs)  You should be screened for sexually transmitted infections (STIs) including gonorrhea and chlamydia if:  You are sexually active and are younger than 70 years of age.  You are older than 70 years of age and your health care provider tells you that you are at risk for this type of infection.  Your sexual activity has changed since you were last screened and you are at an increased risk for chlamydia or gonorrhea. Ask your health care provider if you are at risk.  If you do not have HIV, but are at risk, it may be recommended that you take a prescription medicine daily to prevent HIV infection. This is called pre-exposure prophylaxis (PrEP). You are considered at risk if:  You are sexually active and do not regularly use condoms or know the HIV status of your partner(s).  You take drugs by injection.  You are sexually  active with a partner who has HIV. Talk with your health care provider about whether you are at high risk of being infected with HIV. If you choose to begin PrEP, you should first be tested for HIV. You should then be tested every 3 months for as long as you are taking PrEP.  PREGNANCY   If you are premenopausal and you may become pregnant, ask your health care provider about preconception counseling.  If you may  become pregnant, take 400 to 800 micrograms (mcg) of folic acid every day.  If you want to prevent pregnancy, talk to your health care provider about birth control (contraception). OSTEOPOROSIS AND MENOPAUSE   Osteoporosis is a disease in which the bones lose minerals and strength with aging. This can result in serious bone fractures. Your risk for osteoporosis can be identified using a bone density scan.  If you are 61 years of age or older, or if you are at risk for osteoporosis and fractures, ask your health care provider if you should be screened.  Ask your health care provider whether you should take a calcium or vitamin D supplement to lower your risk for osteoporosis.  Menopause may have certain physical symptoms and risks.  Hormone replacement therapy may reduce some of these symptoms and risks. Talk to your health care provider about whether hormone replacement therapy is right for you.  HOME CARE INSTRUCTIONS   Schedule regular health, dental, and eye exams.  Stay current with your immunizations.   Do not use any tobacco products including cigarettes, chewing tobacco, or electronic cigarettes.  If you are pregnant, do not drink alcohol.  If you are breastfeeding, limit how much and how often you drink alcohol.  Limit alcohol intake to no more than 1 drink per day for nonpregnant women. One drink equals 12 ounces of beer, 5 ounces of wine, or 1 ounces of hard liquor.  Do not use street drugs.  Do not share needles.  Ask your health care provider for help if  you need support or information about quitting drugs.  Tell your health care provider if you often feel depressed.  Tell your health care provider if you have ever been abused or do not feel safe at home.   This information is not intended to replace advice given to you by your health care provider. Make sure you discuss any questions you have with your health care provider.   Document Released: 05/24/2011 Document Revised: 11/29/2014 Document Reviewed: 10/10/2013 Elsevier Interactive Patient Education [employer].

## 2015-09-16 NOTE — Assessment & Plan Note (Signed)
General medical exam normal today including breast exam. PAP and pelvic deferred given age. Labs today as ordered. Flu vaccine today. Colonoscopy is UTD. Mammogram ordered. Encouraged her to be more physically active. Encouraged healthy diet.

## 2015-09-17 LAB — MICROALBUMIN / CREATININE URINE RATIO
Creatinine, Urine: 96.5 mg/dL
Microalb Creat Ratio: 9.1 mg/g creat (ref 0.0–30.0)
Microalb, Ur: 8.8 ug/mL — ABNORMAL HIGH

## 2015-09-26 ENCOUNTER — Other Ambulatory Visit: Payer: Self-pay | Admitting: Internal Medicine

## 2015-09-26 ENCOUNTER — Ambulatory Visit
Admission: RE | Admit: 2015-09-26 | Discharge: 2015-09-26 | Disposition: A | Discharge status: Home / Self Care | Payer: PPO | Source: Ambulatory Visit | Attending: Internal Medicine | Admitting: Internal Medicine

## 2015-09-26 DIAGNOSIS — Z1231 Encounter for screening mammogram for malignant neoplasm of breast: Secondary | ICD-10-CM | POA: Diagnosis not present

## 2015-09-26 DIAGNOSIS — Z853 Personal history of malignant neoplasm of breast: Secondary | ICD-10-CM

## 2015-09-26 LAB — HM MAMMOGRAPHY

## 2015-11-20 ENCOUNTER — Other Ambulatory Visit: Payer: Self-pay | Admitting: Internal Medicine

## 2016-01-06 ENCOUNTER — Other Ambulatory Visit: Payer: Self-pay | Admitting: Internal Medicine

## 2016-02-24 ENCOUNTER — Other Ambulatory Visit: Payer: Self-pay | Admitting: Internal Medicine

## 2016-03-16 ENCOUNTER — Encounter: Payer: Self-pay | Admitting: Internal Medicine

## 2016-03-16 ENCOUNTER — Ambulatory Visit (INDEPENDENT_AMBULATORY_CARE_PROVIDER_SITE_OTHER): Payer: PPO | Admitting: Internal Medicine

## 2016-03-16 VITALS — BP 153/81 | HR 94 | Ht 66.0 in | Wt 211.8 lb

## 2016-03-16 DIAGNOSIS — I10 Essential (primary) hypertension: Secondary | ICD-10-CM

## 2016-03-16 DIAGNOSIS — M25512 Pain in left shoulder: Secondary | ICD-10-CM

## 2016-03-16 LAB — LIPID PANEL
CHOL/HDL RATIO: 3
Cholesterol: 168 mg/dL (ref 0–200)
HDL: 55.3 mg/dL (ref >39.00)
LDL CALC: 92 mg/dL (ref 0–99)
NONHDL: 112.82
Triglycerides: 102 mg/dL (ref 0.0–149.0)
VLDL: 20.4 mg/dL (ref 0.0–40.0)

## 2016-03-16 LAB — COMPREHENSIVE METABOLIC PANEL
ALK PHOS: 82 U/L (ref 39–117)
ALT: 23 U/L (ref 0–35)
AST: 23 U/L (ref 0–37)
Albumin: 4.6 g/dL (ref 3.5–5.2)
BUN: 15 mg/dL (ref 6–23)
CHLORIDE: 100 meq/L (ref 96–112)
CO2: 29 meq/L (ref 19–32)
Calcium: 9.8 mg/dL (ref 8.4–10.5)
Creatinine, Ser: 0.86 mg/dL (ref 0.40–1.20)
GFR: 83.61 mL/min (ref >60.00)
GLUCOSE: 99 mg/dL (ref 70–99)
POTASSIUM: 3.9 meq/L (ref 3.5–5.1)
Sodium: 137 mEq/L (ref 135–145)
TOTAL PROTEIN: 7.8 g/dL (ref 6.0–8.3)
Total Bilirubin: 0.5 mg/dL (ref 0.2–1.2)

## 2016-03-16 MED ORDER — LOSARTAN POTASSIUM 100 MG PO TABS: 100.0000 mg | ORAL_TABLET | Freq: Every day | ORAL | Status: DC

## 2016-03-16 MED ORDER — AMLODIPINE BESYLATE 5 MG PO TABS: 5.0000 mg | ORAL_TABLET | Freq: Every day | ORAL | Status: DC

## 2016-03-16 MED ORDER — SIMVASTATIN 20 MG PO TABS: 20.0000 mg | ORAL_TABLET | Freq: Every day | ORAL | Status: DC

## 2016-03-16 MED ORDER — HYDROCHLOROTHIAZIDE 12.5 MG PO CAPS: 12.5000 mg | ORAL_CAPSULE | Freq: Every day | ORAL | Status: DC

## 2016-03-16 NOTE — Progress Notes (Signed)
Subjective:    Patient ID: Michelle Clark, female    DOB: 06/25/1946, 70 y.o.   MRN: [id number]  HPI  70YO female presents for follow up.  Left shoulder pain - Aching pain if she extends arm posteriorly. No weakness or numbness. No known injury. Taking Tylenol and Aleve with no improvement.  HTN - Compliant with medication. No CP, HA.  Wt Readings from Last 3 Encounters:  03/16/16 211 lb 12.8 oz (96.072 kg)  09/16/15 203 lb 4 oz (92.194 kg)  04/23/15 201 lb 6 oz (91.343 kg)   BP Readings from Last 3 Encounters:  03/16/16 153/81  09/16/15 145/90  04/23/15 138/75    Past Medical History  Diagnosis Date  . Hyperlipidemia   . Hypertension   . Colon polyps   . Cancer [facility]) 2001    Right breast, s/p chemotherapy, XRT and mastectomy, Dr. Jeb Levering   Family History  Problem Relation Age of Onset  . Arthritis Mother   . Arthritis Sister   . Stroke Sister     paralyzed left side  . Breast cancer Sister 75  . Cancer Sister     breast  . Heart disease Brother   . Asthma Brother    Past Surgical History  Procedure Laterality Date  . Appendectomy    . Cholecystectomy    . Breast surgery  2002    mastectomy  . Tubal ligation    . Abdominal hysterectomy  1998    cervix intact  . Mastectomy Right 2001    chemo, rad   Social History   Social History  . Marital Status: Married    Spouse Name: N/A  . Number of Children: N/A  . Years of Education: N/A   Social History Main Topics  . Smoking status: Former Smoker    Quit date: 04/10/1997  . Smokeless tobacco: Never Used  . Alcohol Use: 0.6 oz/week    1 Glasses of wine per week  . Drug Use: No  . Sexual Activity: Not Asked   Other Topics Concern  . None   Social History Narrative   Lives in [place] with husband. No pets      Work - retired, Surveyor, minerals      Diet - regular      Exercise - none    Review of Systems  Constitutional: Negative for fever, chills, appetite change, fatigue and  unexpected weight change.  Eyes: Negative for visual disturbance.  Respiratory: Negative for cough and shortness of breath.   Cardiovascular: Negative for chest pain, palpitations and leg swelling.  Gastrointestinal: Negative for nausea, vomiting, abdominal pain, diarrhea and constipation.  Musculoskeletal: Positive for myalgias and arthralgias.  Skin: Negative for color change and rash.  Neurological: Negative for weakness and numbness.  Hematological: Negative for adenopathy. Does not bruise/bleed easily.  Psychiatric/Behavioral: Positive for sleep disturbance. Negative for dysphoric mood. The patient is not nervous/anxious.        Objective:    BP 153/81 mmHg  Pulse 94  Ht _0  (1.676 m)  Wt 211 lb 12.8 oz (96.072 kg)  BMI 34.20 kg/m2  SpO2 99% Physical Exam  Constitutional: She is oriented to person, place, and time. She appears well-developed and well-nourished. No distress.  HENT:  Head: Normocephalic and atraumatic.  Right Ear: External ear normal.  Left Ear: External ear normal.  Nose: Nose normal.  Mouth/Throat: Oropharynx is clear and moist. No oropharyngeal exudate.  Eyes: Conjunctivae are normal. Pupils are equal, round, and reactive to  light. Right eye exhibits no discharge. Left eye exhibits no discharge. No scleral icterus.  Neck: Normal range of motion. Neck supple. No tracheal deviation present. No thyromegaly present.  Cardiovascular: Normal rate, regular rhythm, normal heart sounds and intact distal pulses.  Exam reveals no gallop and no friction rub.   No murmur heard. Pulmonary/Chest: Effort normal and breath sounds normal. No respiratory distress. She has no wheezes. She has no rales. She exhibits no tenderness.  Musculoskeletal: She exhibits no edema or tenderness.       Right shoulder: She exhibits decreased range of motion (pain with abduction) and pain. She exhibits no tenderness, no bony tenderness and normal strength.  Lymphadenopathy:    She has no  cervical adenopathy.  Neurological: She is alert and oriented to person, place, and time. No cranial nerve deficit. She exhibits normal muscle tone. Coordination normal.  Skin: Skin is warm and dry. No rash noted. She is not diaphoretic. No erythema. No pallor.  Psychiatric: She has a normal mood and affect. Her behavior is normal. Judgment and thought content normal.          Assessment & Plan:   Problem List Items Addressed This Visit      Unprioritized   Essential hypertension, benign - Primary    BP Readings from Last 3 Encounters:  03/16/16 153/81  09/16/15 145/90  04/23/15 138/75   BP generally well controlled for her. Will continue current medication. Renal function with labs.      Relevant Medications   hydrochlorothiazide (MICROZIDE) 12.5 MG capsule   simvastatin (ZOCOR) 20 MG tablet   amLODipine (NORVASC) 5 MG tablet   losartan (COZAAR) 100 MG tablet   Other Relevant Orders   Comp Met (CMET)   Lipid panel   Left shoulder pain    New problem of left shoulder pain. Symptoms and exam concerning for adhesive capsulitis. Will set up sports med evaluation. Continue prn Tylenol and Aleve.      Relevant Orders   Ambulatory referral to Sports Medicine       Return in about 6 months (around 09/15/2016) for Physical.  Ronette Deter, MD [facility]

## 2016-03-16 NOTE — Assessment & Plan Note (Signed)
BP Readings from Last 3 Encounters:  03/16/16 153/81  09/16/15 145/90  04/23/15 138/75   BP generally well controlled for her. Will continue current medication. Renal function with labs.

## 2016-03-16 NOTE — Assessment & Plan Note (Signed)
New problem of left shoulder pain. Symptoms and exam concerning for adhesive capsulitis. Will set up sports med evaluation. Continue prn Tylenol and Aleve.

## 2016-03-16 NOTE — Patient Instructions (Signed)
We will set up an evaluation with Dr. Tamala Julian in Sports Medicine.  Labs today.  Follow up in 6 months.

## 2016-03-16 NOTE — Progress Notes (Signed)
Pre visit review using our clinic review tool, if applicable. No additional management support is needed unless otherwise documented below in the visit note. 

## 2016-03-29 ENCOUNTER — Other Ambulatory Visit: Payer: Self-pay

## 2016-03-29 ENCOUNTER — Encounter: Payer: Self-pay | Admitting: Family Medicine

## 2016-03-29 ENCOUNTER — Ambulatory Visit (INDEPENDENT_AMBULATORY_CARE_PROVIDER_SITE_OTHER)
Admission: RE | Admit: 2016-03-29 | Discharge: 2016-03-29 | Disposition: A | Discharge status: Home / Self Care | Payer: PPO | Source: Ambulatory Visit | Attending: Family Medicine | Admitting: Family Medicine

## 2016-03-29 ENCOUNTER — Ambulatory Visit (INDEPENDENT_AMBULATORY_CARE_PROVIDER_SITE_OTHER): Payer: PPO | Admitting: Family Medicine

## 2016-03-29 VITALS — BP 128/86 | HR 98 | Ht 66.0 in | Wt 208.0 lb

## 2016-03-29 DIAGNOSIS — M25512 Pain in left shoulder: Secondary | ICD-10-CM

## 2016-03-29 DIAGNOSIS — M75102 Unspecified rotator cuff tear or rupture of left shoulder, not specified as traumatic: Secondary | ICD-10-CM

## 2016-03-29 NOTE — Assessment & Plan Note (Signed)
Patient given injection today and tolerated the procedure well. We discussed icing regimen and home exercises. We discussed which activities to do an which ones to avoid. Patient work with Product/process development scientist to learn in greater detail. Given trial of topical anti-inflammatory's. We discussed avoiding heavy lifting. Follow-up in 3-4 weeks. At that time if continuing have pain we'll consider formal physical therapy. X-rays ordered today secondary to history of breast cancer.

## 2016-03-29 NOTE — Progress Notes (Signed)
Pre visit review using our clinic review tool, if applicable. No additional management support is needed unless otherwise documented below in the visit note. 

## 2016-03-29 NOTE — Progress Notes (Signed)
[facility] [place], [place] [address] Phone: [phone] Subjective:    I'm seeing this patient by the request  of:  Rica Mast, MD   CC: Left shoulder pain  [id number] Michelle Clark is a 70 y.o. female coming in with complaint of left shoulder pain. Patient is had this pain for multiple months. Seems worse when reaching behind her back. Does not remember any true injury. Rates the severity of pain a 7 out of 10. Past medical history significant for breast cancer. No x-rays have been done. States that it is not a dull throbbing pain or anything that wakes her up at night. No radiation down the arm. Mild weakness compared to the contralateral side. No swelling associated with it. Has responded well to over-the-counter medications.    Past Medical History  Diagnosis Date  . Hyperlipidemia   . Hypertension   . Colon polyps   . Cancer [facility]) 2001    Right breast, s/p chemotherapy, XRT and mastectomy, Dr. Jeb Levering   Past Surgical History  Procedure Laterality Date  . Appendectomy    . Cholecystectomy    . Breast surgery  2002    mastectomy  . Tubal ligation    . Abdominal hysterectomy  1998    cervix intact  . Mastectomy Right 2001    chemo, rad   Social History   Social History  . Marital Status: Married    Spouse Name: N/A  . Number of Children: N/A  . Years of Education: N/A   Social History Main Topics  . Smoking status: Former Smoker    Quit date: 04/10/1997  . Smokeless tobacco: Never Used  . Alcohol Use: 0.6 oz/week    1 Glasses of wine per week  . Drug Use: No  . Sexual Activity: Not Asked   Other Topics Concern  . None   Social History Narrative   Lives in [place] with husband. No pets      Work - retired, Surveyor, minerals      Diet - regular      Exercise - none   Allergies  Allergen Reactions  . Fluoxetine     drowsiness   Family History  Problem Relation Age of Onset  .  Arthritis Mother   . Arthritis Sister   . Stroke Sister     paralyzed left side  . Breast cancer Sister 10  . Cancer Sister     breast  . Heart disease Brother   . Asthma Brother     Past medical history, social, surgical and family history all reviewed in electronic medical record.  No pertanent information unless stated regarding to the chief complaint.   Review of Systems: No headache, visual changes, nausea, vomiting, diarrhea, constipation, dizziness, abdominal pain, skin rash, fevers, chills, night sweats, weight loss, swollen lymph nodes, body aches, joint swelling, muscle aches, chest pain, shortness of breath, mood changes.   Objective Blood pressure 128/86, pulse 98, height 5\' 6"  (1.676 m), weight 208 lb (94.348 kg), SpO2 99 %.  General: No apparent distress alert and oriented x3 mood and affect normal, dressed appropriately.  HEENT: Pupils equal, extraocular movements intact  Respiratory: Patient's speak in full sentences and does not appear short of breath  Cardiovascular: No lower extremity edema, non tender, no erythema  Skin: Warm dry intact with no signs of infection or rash on extremities or on axial skeleton.  Abdomen: Soft nontender  Neuro: Cranial nerves II through XII  are intact, neurovascularly intact in all extremities with 2+ DTRs and 2+ pulses.  Lymph: No lymphadenopathy of posterior or anterior cervical chain or axillae bilaterally.  Gait normal with good balance and coordination.  MSK:  Non tender with full range of motion and good stability and symmetric strength and tone of  elbows, wrist, hip, knee and ankles bilaterally. Mild arthritic changes of multiple joints Shoulder: left Inspection reveals no abnormalities, atrophy or asymmetry. Palpation is normal with no tenderness over AC joint or bicipital groove. ROM is full in all planes passively. 4 out of 5 strength compared to the contralateral side signs of impingement with positive Neer and Hawkin's  tests, but negative empty can sign. Speeds and Yergason's tests normal. No labral pathology noted with negative Obrien's, negative clunk and good stability. Normal scapular function observed. No painful arc and no drop arm sign. No apprehension sign Kocher lateral shoulder unremarkable  MSK US performed of: left This study was ordered, performed, and interpreted by Charlann Boxer D.O.  Shoulder:   Supraspinatus:  Degenerative tear noted. Approximately 30% on the articular side. No retraction Bursal bulge seen with shoulder abduction on impingement view. Infraspinatus:  Appears normal on long and transverse views. Significant increase in Doppler flow Subscapularis:  Appears normal on long and transverse views. Positive bursa Teres Minor:  Appears normal on long and transverse views. AC joint:  Mild to moderate arthritis Glenohumeral Joint:  Mild arthritis Glenoid Labrum:  Intact without visualized tears. Biceps Tendon:  Appears normal on long and transverse views, no fraying of tendon, tendon located in intertubercular groove, no subluxation with shoulder internal or external rotation.  Impression: Subacromial bursitis, small supraspinatus tear no retraction  Procedure: Real-time Ultrasound Guided Injection of left glenohumeral joint Device: GE Logiq E  Ultrasound guided injection is preferred based studies that show increased duration, increased effect, greater accuracy, decreased procedural pain, increased response rate with ultrasound guided versus blind injection.  Verbal informed consent obtained.  Time-out conducted.  Noted no overlying erythema, induration, or other signs of local infection.  Skin prepped in a sterile fashion.  Local anesthesia: Topical Ethyl chloride.  With sterile technique and under real time ultrasound guidance:  Joint visualized.  23g 1  inch needle inserted posterior approach. Pictures taken for needle placement. Patient did have injection of 2 cc of 1%  lidocaine, 2 cc of 0.5% Marcaine, and 1.0 cc of Kenalog 40 mg/dL. Completed without difficulty  Pain immediately resolved suggesting accurate placement of the medication.  Advised to call if fevers/chills, erythema, induration, drainage, or persistent bleeding.  Images permanently stored and available for review in the ultrasound unit.  Impression: Technically successful ultrasound guided injection.  Procedure note [id number]; 15 minutes spent for Therapeutic exercises as stated in above notes.  This included exercises focusing on stretching, strengthening, with significant focus on eccentric aspects. Shoulder Exercises that included:  Basic scapular stabilization to include adduction and depression of scapula Scaption, focusing on proper movement and good control Internal and External rotation utilizing a theraband, with elbow tucked at side entire time Rows with theraband    Proper technique shown and discussed handout in great detail with ATC.  All questions were discussed and answered.     Impression and Recommendations:     This case required medical decision making of moderate complexity.      Note: This dictation was prepared with Dragon dictation along with smaller phrase technology. Any transcriptional errors that result from this process are unintentional.

## 2016-03-29 NOTE — Patient Instructions (Signed)
Good to see you.  Xray downstairs today  Ice 20 minutes 2 times daily. Usually after activity and before bed. Exercises 3 times a week.  pennsaid pinkie amount topically 2 times daily as needed.  Vitamin D 2000 IU daily  Avoid heavy lifting over your head.  See em again in 3-4 weeks to make sure you are better.

## 2016-04-21 ENCOUNTER — Encounter: Payer: Self-pay | Admitting: Family Medicine

## 2016-04-21 ENCOUNTER — Ambulatory Visit (INDEPENDENT_AMBULATORY_CARE_PROVIDER_SITE_OTHER): Payer: PPO | Admitting: Family Medicine

## 2016-04-21 VITALS — BP 124/82 | HR 88 | Wt 202.0 lb

## 2016-04-21 DIAGNOSIS — M75102 Unspecified rotator cuff tear or rupture of left shoulder, not specified as traumatic: Secondary | ICD-10-CM

## 2016-04-21 NOTE — Progress Notes (Signed)
[facility] [place], [place] [address] Phone: [phone] Subjective:    I'm seeing this patient by the request  of:  Rica Mast, MD   CC: Left shoulder pain Follow-up  [id number] Michelle Clark is a 70 y.o. female coming in with complaint of left shoulder pain. Patient was seen and was diagnosed with more of a subacromial bursitis as well as a small supraspinatus rotator cuff tear. Elected to try a injection as well as start home exercises. Patient states that she has been doing the exercises religiously. States that she is 90% better. States that she has full range of motion. Feels that the strength is better. Only gets her trouble if she leans on the arm or sleeps on that arm too long. Otherwise no trouble with daily activities.    Past Medical History  Diagnosis Date  . Hyperlipidemia   . Hypertension   . Colon polyps   . Cancer [facility]) 2001    Right breast, s/p chemotherapy, XRT and mastectomy, Dr. Jeb Levering   Past Surgical History  Procedure Laterality Date  . Appendectomy    . Cholecystectomy    . Breast surgery  2002    mastectomy  . Tubal ligation    . Abdominal hysterectomy  1998    cervix intact  . Mastectomy Right 2001    chemo, rad   Social History   Social History  . Marital Status: Married    Spouse Name: N/A  . Number of Children: N/A  . Years of Education: N/A   Social History Main Topics  . Smoking status: Former Smoker    Quit date: 04/10/1997  . Smokeless tobacco: Never Used  . Alcohol Use: 0.6 oz/week    1 Glasses of wine per week  . Drug Use: No  . Sexual Activity: Not Asked   Other Topics Concern  . None   Social History Narrative   Lives in [place] with husband. No pets      Work - retired, Surveyor, minerals      Diet - regular      Exercise - none   Allergies  Allergen Reactions  . Fluoxetine     drowsiness   Family History  Problem Relation Age of Onset  .  Arthritis Mother   . Arthritis Sister   . Stroke Sister     paralyzed left side  . Breast cancer Sister 59  . Cancer Sister     breast  . Heart disease Brother   . Asthma Brother     Past medical history, social, surgical and family history all reviewed in electronic medical record.  No pertanent information unless stated regarding to the chief complaint.   Review of Systems: No headache, visual changes, nausea, vomiting, diarrhea, constipation, dizziness, abdominal pain, skin rash, fevers, chills, night sweats, weight loss, swollen lymph nodes, body aches, joint swelling, muscle aches, chest pain, shortness of breath, mood changes.   Objective Blood pressure 124/82, pulse 88, weight 202 lb (91.627 kg).  General: No apparent distress alert and oriented x3 mood and affect normal, dressed appropriately.  HEENT: Pupils equal, extraocular movements intact  Respiratory: Patient's speak in full sentences and does not appear short of breath  Cardiovascular: No lower extremity edema, non tender, no erythema  Skin: Warm dry intact with no signs of infection or rash on extremities or on axial skeleton.  Abdomen: Soft nontender  Neuro: Cranial nerves II through XII are intact, neurovascularly intact in  all extremities with 2+ DTRs and 2+ pulses.  Lymph: No lymphadenopathy of posterior or anterior cervical chain or axillae bilaterally.  Gait normal with good balance and coordination.  MSK:  Non tender with full range of motion and good stability and symmetric strength and tone of  elbows, wrist, hip, knee and ankles bilaterally. Mild arthritic changes of multiple joints Shoulder: left Inspection reveals no abnormalities, atrophy or asymmetry. Palpation is normal with no tenderness over AC joint or bicipital groove. ROM is full in all planes passively. Full strength noted compared to contralateral side with mild impingement sign still remaining. Speeds and Yergason's tests normal. No labral  pathology noted with negative Obrien's, negative clunk and good stability. Normal scapular function observed. No painful arc and no drop arm sign. No apprehension sign Contralateral shoulder unremarkable     Impression and Recommendations:     This case required medical decision making of moderate complexity.      Note: This dictation was prepared with Dragon dictation along with smaller phrase technology. Any transcriptional errors that result from this process are unintentional.

## 2016-04-21 NOTE — Patient Instructions (Signed)
You made me so happy  The shoulder is great  Continue the exercises 2 times a week  Ice when you need it Continue the vitamin D indefinitely.  Tennisball in back left pocket with sitting  Look up piriformis syndrome  See me when you need me.

## 2016-04-21 NOTE — Assessment & Plan Note (Signed)
Patient is making great progress already. Encourage her to continue the home exercises regularly. We discussed the icing regimen. If patient gets more weakness or radiation of pain to come back sooner. Otherwise patient can follow-up as needed. If worsening symptoms possible advance imaging and or formal physical therapy will be necessary.

## 2016-05-07 DIAGNOSIS — R600 Localized edema: Secondary | ICD-10-CM | POA: Diagnosis not present

## 2016-05-07 DIAGNOSIS — E669 Obesity, unspecified: Secondary | ICD-10-CM | POA: Diagnosis not present

## 2016-05-07 DIAGNOSIS — R000 Tachycardia, unspecified: Secondary | ICD-10-CM | POA: Diagnosis not present

## 2016-05-07 DIAGNOSIS — I10 Essential (primary) hypertension: Secondary | ICD-10-CM | POA: Diagnosis not present

## 2016-05-07 DIAGNOSIS — E785 Hyperlipidemia, unspecified: Secondary | ICD-10-CM | POA: Diagnosis not present

## 2016-05-07 DIAGNOSIS — C50919 Malignant neoplasm of unspecified site of unspecified female breast: Secondary | ICD-10-CM | POA: Diagnosis not present

## 2016-05-24 ENCOUNTER — Other Ambulatory Visit: Payer: Self-pay | Admitting: Internal Medicine

## 2016-05-31 ENCOUNTER — Other Ambulatory Visit: Payer: Self-pay | Admitting: Internal Medicine

## 2016-06-15 ENCOUNTER — Encounter: Payer: Self-pay | Admitting: Family

## 2016-06-15 ENCOUNTER — Ambulatory Visit (INDEPENDENT_AMBULATORY_CARE_PROVIDER_SITE_OTHER): Payer: PPO | Admitting: Family

## 2016-06-15 VITALS — BP 140/84 | HR 95 | Temp 98.5°F | Wt 211.0 lb

## 2016-06-15 DIAGNOSIS — M533 Sacrococcygeal disorders, not elsewhere classified: Secondary | ICD-10-CM

## 2016-06-15 MED ORDER — PREDNISONE 10 MG PO TABS: ORAL_TABLET | ORAL | 0 refills | Status: DC

## 2016-06-15 NOTE — Patient Instructions (Addendum)
Suspect SI joint dysfunction. Short course of prednisone to see if this calms things down for you. Aleve as needed. Heat.  See attached exercises  If there is no improvement in your symptoms, or if there is any worsening of symptoms, or if you have any additional concerns, please return for re-evaluation; or, if we are closed, consider going to the Emergency Room for evaluation if symptoms urgent.  Sacroiliac Joint Dysfunction Sacroiliac joint dysfunction is a condition that causes inflammation on one or both sides of the sacroiliac (SI) joint. The SI joint connects the lower part of the spine (sacrum) with the two upper portions of the pelvis (ilium). This condition causes deep aching or burning pain in the low back. In some cases, the pain may also spread into one or both buttocks or hips or spread down the legs. CAUSES This condition may be caused by:  Pregnancy. During pregnancy, extra stress is put on the SI joints because the pelvis widens.  Injury, such as:  Car accidents.  Sport-related injuries.  Work-related injuries.  Having one leg that is shorter than the other.  Conditions that affect the joints, such as:  Rheumatoid arthritis.  Gout.  Psoriatic arthritis.  Joint infection (septic arthritis). Sometimes, the cause of SI joint dysfunction is not known. SYMPTOMS Symptoms of this condition include:  Aching or burning pain in the lower back. The pain may also spread to other areas, such as:  Buttocks.  Groin.  Thighs and legs.  Muscle spasms in or around the painful areas.  Increased pain when standing, walking, running, stair climbing, bending, or lifting. DIAGNOSIS Your health care provider will do a physical exam and take your medical history. During the exam, the health care provider may move one or both of your legs to different positions to check for pain. Various tests may be done to help verify the diagnosis, including:  Imaging tests to look for  other causes of pain. These may include:  MRI.  CT scan.  Bone scan.  Diagnostic injection. A numbing medicine is injected into the SI joint using a needle. If the pain is temporarily improved or stopped after the injection, this can indicate that SI joint dysfunction is the problem. TREATMENT Treatment may vary depending on the cause and severity of your condition. Treatment options may include:  Applying ice or heat to the lower back area. This can help to reduce pain and muscle spasms.  Medicines to relieve pain or inflammation or to relax the muscles.  Wearing a back brace (sacroiliac brace) to help support the joint while your back is healing.  Physical therapy to increase muscle strength around the joint and flexibility at the joint. This may also involve learning proper body positions and ways of moving to relieve stress on the joint.  Direct manipulation of the SI joint.  Injections of steroid medicine into the joint in order to reduce pain and swelling.  Radiofrequency ablation to burn away nerves that are carrying pain messages from the joint.  Use of a device that provides electrical stimulation in order to reduce pain at the joint.  Surgery to put in screws and plates that limit or prevent joint motion. This is rare. HOME CARE INSTRUCTIONS  Rest as needed. Limit your activities as directed by your health care provider.  Take medicines only as directed by your health care provider.  If directed, apply ice to the affected area:  Put ice in a plastic bag.  Place a towel between your  skin and the bag.  Leave the ice on for 20 minutes, 2-3 times per day.  Use a heating pad or a moist heat pack as directed by your health care provider.  Exercise as directed by your health care provider or physical therapist.  Keep all follow-up visits as directed by your health care provider. This is important. SEEK MEDICAL CARE IF:  Your pain is not controlled with  medicine.  You have a fever.  You have increasingly severe pain. SEEK IMMEDIATE MEDICAL CARE IF:  You have weakness, numbness, or tingling in your legs or feet.  You lose control of your bladder or bowel.   This information is not intended to replace advice given to you by your health care provider. Make sure you discuss any questions you have with your health care provider.   Document Released: 02/04/2009 Document Revised: 03/25/2015 Document Reviewed: 07/16/2014 Elsevier Interactive Patient Education [employer].

## 2016-06-15 NOTE — Progress Notes (Signed)
Subjective:    Patient ID: Michelle Clark, female    DOB: [birth year], 70 y.o.   MRN: [id number]  CC: Michelle Clark is a 69 y.o. female who presents today for an acute visit.    HPI: Patient here for evaluation of right hip pain that radiates to low back, onset yesterday, improved. Describes as ache. No rash, h/o kidney stones, abominal pain.  Relieved with aleve. Worse with twisting and hard to get OOB. No numbness or tingling in the legs. No injury or falls. No fever or chills.h/o breast cancer. No LE weakness, falls, saddle anesthesia, or urinary or bowel incontinence.      HISTORY:  Past Medical History:  Diagnosis Date  . Cancer [facility]) 2001   Right breast, s/p chemotherapy, XRT and mastectomy, Dr. Jeb Levering  . Colon polyps   . Hyperlipidemia   . Hypertension    Past Surgical History:  Procedure Laterality Date  . ABDOMINAL HYSTERECTOMY  1998   cervix intact  . APPENDECTOMY    . BREAST SURGERY  2002   mastectomy  . CHOLECYSTECTOMY    . MASTECTOMY Right 2001   chemo, rad  . TUBAL LIGATION     Family History  Problem Relation Age of Onset  . Arthritis Mother   . Arthritis Sister   . Stroke Sister     paralyzed left side  . Breast cancer Sister 33  . Cancer Sister     breast  . Heart disease Brother   . Asthma Brother     Allergies: Fluoxetine Current Outpatient Prescriptions on File Prior to Visit  Medication Sig Dispense Refill  . amLODipine (NORVASC) 5 MG tablet Take 1 tablet (5 mg total) by mouth daily. 90 tablet 3  . aspirin 81 MG tablet Take 81 mg by mouth daily.    . cyclobenzaprine (FLEXERIL) 5 MG tablet TAKE 1 TABLET BY MOUTH EVERY NIGHT AT BEDTIME AS NEEDED FOR MUSCLE SPASMS 30 tablet 5  . furosemide (LASIX) 40 MG tablet Take 1 tablet (40 mg total) by mouth daily. 30 tablet 0  . hydrochlorothiazide (MICROZIDE) 12.5 MG capsule Take 1 capsule (12.5 mg total) by mouth daily. 90 capsule 3  . losartan (COZAAR) 100 MG tablet TAKE 1 TABLET BY MOUTH ONCE A  DAY 90 tablet 3  . metoprolol succinate (TOPROL-XL) 50 MG 24 hr tablet TAKE 1 TABLET BY MOUTH ONCE A DAY 30 tablet 3  . Multiple Vitamin (MULTIVITAMIN) tablet Take 1 tablet by mouth daily.    . naproxen sodium (ANAPROX) 220 MG tablet Take 220 mg by mouth 2 (two) times daily with a meal.    . simvastatin (ZOCOR) 20 MG tablet Take 1 tablet (20 mg total) by mouth daily. 90 tablet 3   No current facility-administered medications on file prior to visit.     Social History  Substance Use Topics  . Smoking status: Former Smoker    Quit date: 04/10/1997  . Smokeless tobacco: Never Used  . Alcohol use 0.6 oz/week    1 Glasses of wine per week    Review of Systems  Constitutional: Negative for chills and fever.  Respiratory: Negative for cough.   Cardiovascular: Negative for chest pain and palpitations.  Gastrointestinal: Negative for nausea and vomiting.  Musculoskeletal: Positive for back pain.      Objective:    BP 140/84   Pulse 95   Temp 98.5 F (36.9 C) (Oral)   Wt 211 lb (95.7 kg)   SpO2 96%   BMI  34.06 kg/m    Physical Exam  Constitutional: She appears well-developed and well-nourished.  Eyes: Conjunctivae are normal.  Cardiovascular: Normal rate, regular rhythm, normal heart sounds and normal pulses.   Pulmonary/Chest: Effort normal and breath sounds normal. She has no wheezes. She has no rhonchi. She has no rales.  Abdominal: There is no CVA tenderness.  Musculoskeletal:       Lumbar back: She exhibits normal range of motion, no tenderness, no bony tenderness, no swelling, no edema, no pain and no spasm.  Full range of motion with flexion, tension, lateral side bends. No bony tenderness. No pain, numbness, tingling elicited with single leg raise bilaterally.   Left hip:  No limp or waddling gait. Full ROM with flexion and hip rotation in flexion.  No pain of lateral hip with  (flexion-abduction-external rotation) test. Pain with deep palpation of greater trochanter.  Pain with palpation over SI joint.   Neurological: She is alert. She has normal strength. No sensory deficit.  Reflex Scores:      Patellar reflexes are 2+ on the right side and 2+ on the left side. Sensation and strength intact bilateral lower extremities.  Skin: Skin is warm and dry.  Psychiatric: She has a normal mood and affect. Her speech is normal and behavior is normal. Thought content normal.  Vitals reviewed.      Assessment & Plan:   1. Sacroiliac joint dysfunction of right side Working diagnoses of SI joint dysfunction supported by pain with palpation over SI joint. Trial of prednisone. Exercise. If conservative therapy fails, patient discussing her back to Michelle Clark of sports medicine.    I am having Michelle Clark start on predniSONE. I am also having her maintain her multivitamin, aspirin, naproxen sodium, furosemide, cyclobenzaprine, hydrochlorothiazide, simvastatin, amLODipine, losartan, metoprolol succinate, and Vitamin D.   Meds ordered this encounter  Medications  . Cholecalciferol (VITAMIN D) 2000 units tablet    Sig: Take 1 tablet by mouth daily.  . predniSONE (DELTASONE) 10 MG tablet    Sig: Take 40 mg by mouth on day 1, then taper 10 mg daily until gone    Dispense:  10 tablet    Refill:  0    Order Specific Question:   Supervising Provider    Answer:   Michelle Clark [[id number]]    Return precautions given.   Risks, benefits, and alternatives of the medications and treatment plan prescribed today were discussed, and patient expressed understanding.   Education regarding symptom management and diagnosis given to patient on AVS.  Continue to follow with Michelle Mast, MD for routine health maintenance.   Michelle Clark and I agreed with plan.   Michelle Paris, FNP

## 2016-09-15 ENCOUNTER — Encounter: Payer: PPO | Admitting: Internal Medicine

## 2016-09-15 ENCOUNTER — Ambulatory Visit (INDEPENDENT_AMBULATORY_CARE_PROVIDER_SITE_OTHER): Payer: PPO | Admitting: Family Medicine

## 2016-09-15 ENCOUNTER — Encounter: Payer: Self-pay | Admitting: Family Medicine

## 2016-09-15 VITALS — BP 136/84 | HR 91 | Temp 97.7°F | Ht 65.5 in | Wt 212.8 lb

## 2016-09-15 DIAGNOSIS — Z1231 Encounter for screening mammogram for malignant neoplasm of breast: Secondary | ICD-10-CM | POA: Diagnosis not present

## 2016-09-15 DIAGNOSIS — Z6834 Body mass index (BMI) 34.0-34.9, adult: Secondary | ICD-10-CM

## 2016-09-15 DIAGNOSIS — E6609 Other obesity due to excess calories: Secondary | ICD-10-CM | POA: Diagnosis not present

## 2016-09-15 DIAGNOSIS — Z0001 Encounter for general adult medical examination with abnormal findings: Secondary | ICD-10-CM

## 2016-09-15 DIAGNOSIS — L918 Other hypertrophic disorders of the skin: Secondary | ICD-10-CM

## 2016-09-15 DIAGNOSIS — R609 Edema, unspecified: Secondary | ICD-10-CM

## 2016-09-15 DIAGNOSIS — Z130 Encounter for screening for diseases of the blood and blood-forming organs and certain disorders involving the immune mechanism: Secondary | ICD-10-CM

## 2016-09-15 DIAGNOSIS — Z1329 Encounter for screening for other suspected endocrine disorder: Secondary | ICD-10-CM | POA: Diagnosis not present

## 2016-09-15 DIAGNOSIS — Z0000 Encounter for general adult medical examination without abnormal findings: Secondary | ICD-10-CM | POA: Insufficient documentation

## 2016-09-15 DIAGNOSIS — Z23 Encounter for immunization: Secondary | ICD-10-CM

## 2016-09-15 DIAGNOSIS — R200 Anesthesia of skin: Secondary | ICD-10-CM | POA: Diagnosis not present

## 2016-09-15 DIAGNOSIS — Z1159 Encounter for screening for other viral diseases: Secondary | ICD-10-CM | POA: Diagnosis not present

## 2016-09-15 DIAGNOSIS — Z13820 Encounter for screening for osteoporosis: Secondary | ICD-10-CM

## 2016-09-15 DIAGNOSIS — Z1239 Encounter for other screening for malignant neoplasm of breast: Secondary | ICD-10-CM

## 2016-09-15 LAB — LIPID PANEL
CHOLESTEROL: 163 mg/dL (ref 0–200)
HDL: 57.6 mg/dL (ref >39.00)
LDL Cholesterol: 86 mg/dL (ref 0–99)
NonHDL: 105.4
Total CHOL/HDL Ratio: 3
Triglycerides: 98 mg/dL (ref 0.0–149.0)
VLDL: 19.6 mg/dL (ref 0.0–40.0)

## 2016-09-15 LAB — COMPREHENSIVE METABOLIC PANEL
ALT: 28 U/L (ref 0–35)
AST: 29 U/L (ref 0–37)
Albumin: 4.9 g/dL (ref 3.5–5.2)
Alkaline Phosphatase: 74 U/L (ref 39–117)
BUN: 19 mg/dL (ref 6–23)
CALCIUM: 10.3 mg/dL (ref 8.4–10.5)
CHLORIDE: 101 meq/L (ref 96–112)
CO2: 27 meq/L (ref 19–32)
Creatinine, Ser: 0.97 mg/dL (ref 0.40–1.20)
GFR: 72.66 mL/min (ref >60.00)
Glucose, Bld: 92 mg/dL (ref 70–99)
Potassium: 3.7 mEq/L (ref 3.5–5.1)
Sodium: 138 mEq/L (ref 135–145)
Total Bilirubin: 0.5 mg/dL (ref 0.2–1.2)
Total Protein: 8.5 g/dL — ABNORMAL HIGH (ref 6.0–8.3)

## 2016-09-15 LAB — CBC
HCT: 43.8 % (ref 36.0–46.0)
Hemoglobin: 14.4 g/dL (ref 12.0–15.0)
MCHC: 33 g/dL (ref 30.0–36.0)
MCV: 85.5 fl (ref 78.0–100.0)
PLATELETS: 228 10*3/uL (ref 150.0–400.0)
RBC: 5.12 Mil/uL — ABNORMAL HIGH (ref 3.87–5.11)
RDW: 14.2 % (ref 11.5–15.5)
WBC: 7.6 10*3/uL (ref 4.0–10.5)

## 2016-09-15 LAB — TSH: TSH: 4.21 u[IU]/mL (ref 0.35–4.50)

## 2016-09-15 LAB — HEMOGLOBIN A1C: Hgb A1c MFr Bld: 5.9 % (ref 4.6–6.5)

## 2016-09-15 NOTE — Progress Notes (Signed)
Tommi Rumps, MD Phone: [phone]  Michelle Clark is a 70 y.o. female who presents today for physical exam.  Diet consists of whatever she wants. Eating more fast food recently as she has taken on a part-time job. Does cook at home and eats baked chicken and vegetables. Does not do anything for exercise. She is due for a tetanus vaccination and flu shot.  Up-to-date on pneumonia vaccine. Up-to-date on Zostavax. No prior hepatitis C testing. No prior DEXA scan. She is up-to-date on colonoscopy. She needs a mammogram. She does see an eye doctor. No dentist.  Patient notes she has a mole in her right axilla that she noticed a couple weeks ago. She is unable to see this. Feels like it may be a skin tag. No pain or bleeding.  Lower extremity edema: Patient notes since changing her blood pressure medicine several years ago she's had some mild edema in her lower extremities. She notes no shortness of breath, chest pain, orthopnea, or PND. She is on amlodipine. She does note they are improved at night when she props her feet up.  Numbness: Patient reports numbness in her hands only at night. It occurs when she lays on her side. Resolves with change in position. Can occur in either hand. Does not get any numbness anywhere else. No weakness. No persistent numbness. No symptoms at this time.  Active Ambulatory Problems    Diagnosis Date Noted  . Essential hypertension, benign 04/10/2013  . Hyperlipidemia 04/10/2013  . History of breast cancer 04/10/2013  . Medicare annual wellness visit, subsequent 10/15/2013  . Postmenopausal estrogen deficiency 10/15/2013  . Obesity (BMI 30-39.9) 09/02/2014  . Bereavement 10/24/2014  . Adjustment disorder with anxious mood 02/19/2015  . Edema 04/15/2015  . Screening for breast cancer 09/16/2015  . Left shoulder pain 03/16/2016  . Left rotator cuff tear 03/29/2016  . Encounter for general adult medical examination with abnormal findings 09/15/2016    . Skin tag 09/15/2016  . Numbness 09/15/2016   Resolved Ambulatory Problems    Diagnosis Date Noted  . Unspecified hypothyroidism 04/10/2013  . Need for prophylactic vaccination and inoculation against influenza 10/15/2013  . Neck pain on left side 04/19/2014  . Need for prophylactic vaccination against Streptococcus pneumoniae (pneumococcus) 04/19/2014  . Plantar fasciitis 04/19/2014   Past Medical History:  Diagnosis Date  . Cancer ([place]) 2001  . Colon polyps   . Hyperlipidemia   . Hypertension     Family History  Problem Relation Age of Onset  . Arthritis Mother   . Arthritis Sister   . Stroke Sister     paralyzed left side  . Breast cancer Sister 72  . Cancer Sister     breast  . Heart disease Brother   . Asthma Brother     Social History   Social History  . Marital status: Married    Spouse name: N/A  . Number of children: N/A  . Years of education: N/A   Occupational History  . Not on file.   Social History Main Topics  . Smoking status: Former Smoker    Quit date: 04/10/1997  . Smokeless tobacco: Never Used  . Alcohol use 0.6 oz/week    1 Glasses of wine per week  . Drug use: No  . Sexual activity: Not on file   Other Topics Concern  . Not on file   Social History Narrative   Lives in [place] with husband. No pets      Work - retired,  sewing operator      Diet - regular      Exercise - none    ROS  General:  Negative for nexplained weight loss, fever Skin: positive for new or changing mole, negative sore that won't heal HEENT: Negative for trouble hearing, trouble seeing, ringing in ears, mouth sores, hoarseness, change in voice, dysphagia. CV:  Positive for edema, Negative for chest pain, dyspnea, palpitations Resp: Negative for cough, dyspnea, hemoptysis GI: Negative for nausea, vomiting, diarrhea, constipation, abdominal pain, melena, hematochezia. GU: Negative for dysuria, incontinence, urinary hesitance, hematuria, vaginal or  penile discharge, polyuria, sexual difficulty, lumps in testicle or breasts MSK: Negative for muscle cramps or aches, joint pain or swelling Neuro: Positive for numbness, Negative for headaches, weakness, dizziness, passing out/fainting Psych: Negative for depression, anxiety, memory problems  Objective  Physical Exam Vitals:   09/15/16 0853  BP: 136/84  Pulse: 91  Temp: 97.7 F (36.5 C)    BP Readings from Last 3 Encounters:  09/15/16 136/84  06/15/16 140/84  04/21/16 124/82   Wt Readings from Last 3 Encounters:  09/15/16 212 lb 12.8 oz (96.5 kg)  06/15/16 211 lb (95.7 kg)  04/21/16 202 lb (91.6 kg)    Physical Exam  Constitutional: She is well-developed, well-nourished, and in no distress.  HENT:  Head: Normocephalic and atraumatic.  Mouth/Throat: Oropharynx is clear and moist.  Eyes: Conjunctivae are normal. Pupils are equal, round, and reactive to light.  Neck: Neck supple.  Cardiovascular: Normal rate, regular rhythm and normal heart sounds.   Pulmonary/Chest: Effort normal and breath sounds normal.  Breast exam completed with no abnormalities noted in the left breast, no masses or skin changes or nipple inversion, right breast absent, no abnormalities palpated of right chest wall, no axillary abnormalities palpated, there is a small normal-appearing nevus versus skin tag in her right axilla  Abdominal: Soft. Bowel sounds are normal. She exhibits no distension. There is no tenderness. There is no rebound and no guarding.  Genitourinary:  Genitourinary Comments: Speculum exam attempted though vaginal atrophy prevented opening of the speculum, unable to appreciate the cervix, no abnormalities noted on bimanual exam  Musculoskeletal: She exhibits no edema.  Lymphadenopathy:    She has no cervical adenopathy.  Neurological: She is alert. Gait normal.  CN 2-12 intact, 5/5 strength in bilateral biceps, triceps, grip, quads, hamstrings, plantar and dorsiflexion, sensation to  light touch intact in bilateral UE and LE, normal gait  Skin: Skin is warm and dry.  Psychiatric: Mood and affect normal.     Assessment/Plan:   Encounter for general adult medical examination with abnormal findings Overall doing well. Blood pressure is in good control. She is obese. Discussed diet and exercise. No need for Pap smear given age. Pelvic exam without abnormalities other than vaginal atrophy. Tetanus vaccination and flu vaccination brought up-to-date. We'll check hepatitis C and other lab work as outlined below. Mammogram will be ordered. DEXA scan will be ordered.  Skin tag Lesion in right axilla consistent with skin tag. Less likely nevus. She'll continue to monitor this. If changes she'll let us know.  Numbness Positional numbness alternating between hands depending on which arm she lays on while sleeping. Benign neurological exam. Suspect that this is positionally related and due to nerve compression when she lays on her arms. Discussed monitoring this and she was given return precautions.  Edema Continues to have issues with edema in her lower extremities. Suspect related to amlodipine or venous insufficiency. No CHF symptoms. We will  check lab work to rule out other causes. Could consider discontinuing amlodipine. Advised to keep legs elevated.   Orders Placed This Encounter  Procedures  . MM SCREENING BREAST TOMO BILATERAL    Standing Status:   Future    Standing Expiration Date:   11/15/2017    Order Specific Question:   Reason for Exam (SYMPTOM  OR DIAGNOSIS REQUIRED)    Answer:   breast cancer screening, has history of right breast cancer s/p mastectomy    Order Specific Question:   Preferred imaging location?    Answer:   [facility]  . DG Bone Density    Standing Status:   Future    Standing Expiration Date:   11/15/2017    Order Specific Question:   Reason for Exam (SYMPTOM  OR DIAGNOSIS REQUIRED)    Answer:   osteoporosis screening    Order Specific  Question:   Preferred imaging location?    Answer:   [facility]  . Flu vaccine HIGH DOSE PF  . Tdap vaccine greater than or equal to 71yo IM  . CBC  . Comp Met (CMET)  . HgB A1c  . TSH  . Hepatitis C Antibody  . Lipid Profile    No orders of the defined types were placed in this encounter.    Tommi Rumps, MD [place]

## 2016-09-15 NOTE — Progress Notes (Signed)
Pre visit review using our clinic review tool, if applicable. No additional management support is needed unless otherwise documented below in the visit note. 

## 2016-09-15 NOTE — Assessment & Plan Note (Signed)
Continues to have issues with edema in her lower extremities. Suspect related to amlodipine or venous insufficiency. No CHF symptoms. We will check lab work to rule out other causes. Could consider discontinuing amlodipine. Advised to keep legs elevated.

## 2016-09-15 NOTE — Assessment & Plan Note (Addendum)
Overall doing well. Blood pressure is in good control. She is obese. Discussed diet and exercise. No need for Pap smear given age. Pelvic exam without abnormalities other than vaginal atrophy. Tetanus vaccination and flu vaccination brought up-to-date. We'll check hepatitis C and other lab work as outlined below. Mammogram will be ordered. DEXA scan will be ordered.

## 2016-09-15 NOTE — Patient Instructions (Signed)
Nice to see you. Please monitor the mole in your right armpit. If this changes please let us know. Please start working on diet and exercise as we discussed. We will get some lab work and call with the results. Please try changing the position you sleep in to see if this helps with the numbness.  If you develop new numbness or numbness at any other time or persistent numbness please seek medical attention immediately.

## 2016-09-15 NOTE — Assessment & Plan Note (Signed)
Positional numbness alternating between hands depending on which arm she lays on while sleeping. Benign neurological exam. Suspect that this is positionally related and due to nerve compression when she lays on her arms. Discussed monitoring this and she was given return precautions.

## 2016-09-15 NOTE — Assessment & Plan Note (Signed)
Lesion in right axilla consistent with skin tag. Less likely nevus. She'll continue to monitor this. If changes she'll let us know.

## 2016-09-16 ENCOUNTER — Telehealth: Payer: Self-pay | Admitting: Family Medicine

## 2016-09-16 DIAGNOSIS — Z853 Personal history of malignant neoplasm of breast: Secondary | ICD-10-CM

## 2016-09-16 LAB — HEPATITIS C ANTIBODY: HCV AB: NEGATIVE

## 2016-09-16 NOTE — Telephone Encounter (Signed)
Please advise 

## 2016-09-16 NOTE — Telephone Encounter (Signed)
Pt mammo order needs to be changed to left breast screening due to right mastectomy per [facility] at [facility]. Imaging code is [id number]. Thank you!

## 2016-09-16 NOTE — Telephone Encounter (Signed)
Order changed.

## 2016-09-20 ENCOUNTER — Telehealth: Payer: Self-pay | Admitting: Family Medicine

## 2016-09-20 DIAGNOSIS — Z1239 Encounter for other screening for malignant neoplasm of breast: Secondary | ICD-10-CM

## 2016-09-20 NOTE — Telephone Encounter (Signed)
Pt had a mychart reminder regarding needing a Dexa scan and mammogram. Referral needed please and thank you!

## 2016-09-20 NOTE — Telephone Encounter (Signed)
Both were ordered last week during her physical.

## 2016-09-20 NOTE — Telephone Encounter (Signed)
Can you please order

## 2016-09-21 NOTE — Telephone Encounter (Signed)
LMTRC

## 2016-09-21 NOTE — Telephone Encounter (Signed)
Pt aware that someone will call her with appts.

## 2016-10-04 ENCOUNTER — Telehealth: Payer: Self-pay | Admitting: *Deleted

## 2016-10-04 MED ORDER — METOPROLOL SUCCINATE ER 50 MG PO TB24: 50.0000 mg | ORAL_TABLET | Freq: Every day | ORAL | 3 refills | Status: DC

## 2016-10-04 NOTE — Telephone Encounter (Signed)
Pt requested a medication refill for metoprolol  [facility]

## 2016-10-04 NOTE — Telephone Encounter (Signed)
Is it ok to refill this RX. Patient last seen by you on 09/20/16.

## 2016-10-04 NOTE — Telephone Encounter (Signed)
Sent to pharmacy 

## 2016-10-11 ENCOUNTER — Telehealth: Payer: Self-pay

## 2016-10-11 DIAGNOSIS — R779 Abnormality of plasma protein, unspecified: Secondary | ICD-10-CM

## 2016-10-11 DIAGNOSIS — E8809 Other disorders of plasma-protein metabolism, not elsewhere classified: Secondary | ICD-10-CM

## 2016-10-11 NOTE — Telephone Encounter (Signed)
Pt coming for repeat labs 10/12/16. Please place future orders. Thank you.

## 2016-10-11 NOTE — Telephone Encounter (Signed)
Order placed

## 2016-10-12 ENCOUNTER — Other Ambulatory Visit (INDEPENDENT_AMBULATORY_CARE_PROVIDER_SITE_OTHER): Payer: PPO

## 2016-10-12 DIAGNOSIS — R779 Abnormality of plasma protein, unspecified: Secondary | ICD-10-CM

## 2016-10-12 DIAGNOSIS — E8809 Other disorders of plasma-protein metabolism, not elsewhere classified: Secondary | ICD-10-CM | POA: Diagnosis not present

## 2016-10-12 LAB — COMPREHENSIVE METABOLIC PANEL
ALBUMIN: 4.5 g/dL (ref 3.5–5.2)
ALT: 22 U/L (ref 0–35)
AST: 27 U/L (ref 0–37)
Alkaline Phosphatase: 78 U/L (ref 39–117)
BUN: 16 mg/dL (ref 6–23)
CHLORIDE: 104 meq/L (ref 96–112)
CO2: 27 mEq/L (ref 19–32)
Calcium: 9.8 mg/dL (ref 8.4–10.5)
Creatinine, Ser: 0.87 mg/dL (ref 0.40–1.20)
GFR: 82.36 mL/min (ref >60.00)
Glucose, Bld: 94 mg/dL (ref 70–99)
POTASSIUM: 3.8 meq/L (ref 3.5–5.1)
SODIUM: 139 meq/L (ref 135–145)
Total Bilirubin: 0.5 mg/dL (ref 0.2–1.2)
Total Protein: 7.8 g/dL (ref 6.0–8.3)

## 2016-11-11 ENCOUNTER — Ambulatory Visit
Admission: RE | Admit: 2016-11-11 | Discharge: 2016-11-11 | Disposition: A | Discharge status: Home / Self Care | Payer: PPO | Source: Ambulatory Visit | Attending: Family Medicine | Admitting: Family Medicine

## 2016-11-11 DIAGNOSIS — M85852 Other specified disorders of bone density and structure, left thigh: Secondary | ICD-10-CM | POA: Insufficient documentation

## 2016-11-11 DIAGNOSIS — Z13820 Encounter for screening for osteoporosis: Secondary | ICD-10-CM | POA: Diagnosis not present

## 2016-11-11 DIAGNOSIS — Z780 Asymptomatic menopausal state: Secondary | ICD-10-CM | POA: Insufficient documentation

## 2016-11-11 DIAGNOSIS — M8588 Other specified disorders of bone density and structure, other site: Secondary | ICD-10-CM | POA: Diagnosis not present

## 2016-11-11 DIAGNOSIS — Z853 Personal history of malignant neoplasm of breast: Secondary | ICD-10-CM | POA: Insufficient documentation

## 2016-11-11 DIAGNOSIS — Z1231 Encounter for screening mammogram for malignant neoplasm of breast: Secondary | ICD-10-CM | POA: Insufficient documentation

## 2016-11-16 LAB — HM MAMMOGRAPHY

## 2016-12-30 DIAGNOSIS — H43813 Vitreous degeneration, bilateral: Secondary | ICD-10-CM | POA: Diagnosis not present

## 2017-01-12 ENCOUNTER — Other Ambulatory Visit: Payer: Self-pay

## 2017-01-12 DIAGNOSIS — I10 Essential (primary) hypertension: Secondary | ICD-10-CM

## 2017-01-12 MED ORDER — HYDROCHLOROTHIAZIDE 12.5 MG PO CAPS: 12.5000 mg | ORAL_CAPSULE | Freq: Every day | ORAL | 1 refills | Status: DC

## 2017-01-12 NOTE — Telephone Encounter (Signed)
Last OV 09/15/16 last filled by Dr.Walker 03/16/16 90 3rf

## 2017-01-12 NOTE — Telephone Encounter (Signed)
Sent to pharmacy 

## 2017-02-08 ENCOUNTER — Other Ambulatory Visit: Payer: Self-pay | Admitting: Family Medicine

## 2017-03-16 ENCOUNTER — Ambulatory Visit (INDEPENDENT_AMBULATORY_CARE_PROVIDER_SITE_OTHER): Payer: PPO | Admitting: Family Medicine

## 2017-03-16 ENCOUNTER — Encounter: Payer: Self-pay | Admitting: Family Medicine

## 2017-03-16 VITALS — BP 136/84 | HR 86 | Temp 98.0°F | Wt 211.6 lb

## 2017-03-16 DIAGNOSIS — R7303 Prediabetes: Secondary | ICD-10-CM | POA: Diagnosis not present

## 2017-03-16 DIAGNOSIS — I10 Essential (primary) hypertension: Secondary | ICD-10-CM

## 2017-03-16 DIAGNOSIS — E785 Hyperlipidemia, unspecified: Secondary | ICD-10-CM | POA: Diagnosis not present

## 2017-03-16 DIAGNOSIS — Z853 Personal history of malignant neoplasm of breast: Secondary | ICD-10-CM

## 2017-03-16 LAB — COMPREHENSIVE METABOLIC PANEL
ALT: 21 U/L (ref 0–35)
AST: 22 U/L (ref 0–37)
Albumin: 4.7 g/dL (ref 3.5–5.2)
Alkaline Phosphatase: 71 U/L (ref 39–117)
BUN: 17 mg/dL (ref 6–23)
CALCIUM: 10 mg/dL (ref 8.4–10.5)
CHLORIDE: 102 meq/L (ref 96–112)
CO2: 28 meq/L (ref 19–32)
Creatinine, Ser: 0.83 mg/dL (ref 0.40–1.20)
GFR: 86.86 mL/min (ref >60.00)
Glucose, Bld: 100 mg/dL — ABNORMAL HIGH (ref 70–99)
Potassium: 3.6 mEq/L (ref 3.5–5.1)
Sodium: 137 mEq/L (ref 135–145)
Total Bilirubin: 0.5 mg/dL (ref 0.2–1.2)
Total Protein: 8 g/dL (ref 6.0–8.3)

## 2017-03-16 LAB — HEMOGLOBIN A1C: HEMOGLOBIN A1C: 6 % (ref 4.6–6.5)

## 2017-03-16 MED ORDER — HYDROCHLOROTHIAZIDE 25 MG PO TABS: 25.0000 mg | ORAL_TABLET | Freq: Every day | ORAL | 2 refills | Status: DC

## 2017-03-16 NOTE — Assessment & Plan Note (Signed)
Tolerating simvastatin. Continue this.

## 2017-03-16 NOTE — Patient Instructions (Signed)
Nice to see you. We will check lab work today and contact her with the results. Please work on starting to walk for exercise 2 days a week. We will increase your hydrochlorothiazide for your blood pressure and heavy return in 2-3 weeks for repeat lab work and blood pressure check.

## 2017-03-16 NOTE — Progress Notes (Signed)
  Tommi Rumps, MD Phone: [phone]  Michelle Clark is a 71 y.o. female who presents today for f/u.  HYPERTENSION  Disease Monitoring  Home BP Monitoring 140/60's-70's Chest pain- no    Dyspnea- no Medications  Compliance-  Taking amlodipine, Lasix, HCTZ, losartan, metoprolol.  Edema- chronic minimal edema, no orthopnea or PND, present in the morning and builds up throughout the day. Resolves with elevating her legs.  Hyperlipidemia: Taking simvastatin. No claudication or right upper quadrant pain. No myalgias.  Prediabetes: A1c was 5.9 on last check. She eats just about everything. Does eat fast food to 3 times a week. Tries to eat chicken and vegetables during the week. No exercise.  History of right breast cancer. Status post mastectomy. Left breast mammogram previously last year was normal. She wonders if she could get 3-D mammogram next time.   PMH: Former smoker   ROS see history of present illness  Objective  Physical Exam Vitals:   03/16/17 1019 03/16/17 1051  BP: 140/82 136/84  Pulse: 86   Temp: 98 F (36.7 C)     BP Readings from Last 3 Encounters:  03/16/17 136/84  09/15/16 136/84  06/15/16 140/84   Wt Readings from Last 3 Encounters:  03/16/17 211 lb 9.6 oz (96 kg)  09/15/16 212 lb 12.8 oz (96.5 kg)  06/15/16 211 lb (95.7 kg)    Physical Exam  Constitutional: No distress.  Cardiovascular: Normal rate, regular rhythm and normal heart sounds.   Pulmonary/Chest: Effort normal and breath sounds normal.  Musculoskeletal: She exhibits no edema.  Neurological: She is alert. Gait normal.  Skin: Skin is warm and dry. She is not diaphoretic.     Assessment/Plan: Please see individual problem list.  Essential hypertension, benign Slightly above the new goal of 130/80. We will increase her hydrochlorothiazide to 25 mg. She'll continue her other blood pressure medications. She will take two 12.5 mg tablets of her current prescription daily until she  runs out and then start on the new prescription. She'll monitor her blood pressure at home. She'll return in 2-3 weeks for blood pressure check and repeat lab work. Advised if she develops lightheadedness with this she should let us know.  Hyperlipidemia Tolerating simvastatin. Continue this.  History of breast cancer Most recent mammogram negative. Continue to monitor and repeat yearly left breast mammograms. She will check with her insurance company to see if they cover 3-D mammograms.  Prediabetes Diagnosed at most recent physical. She has not changed her dietary habits or started exercising. Discussed trying to minimize fast foods and then starting to walk for exercise 2 days a week. We'll check lab work today.   Orders Placed This Encounter  Procedures  . HgB A1c  . Comp Met (CMET)    Meds ordered this encounter  Medications  . hydrochlorothiazide (HYDRODIURIL) 25 MG tablet    Sig: Take 1 tablet (25 mg total) by mouth daily.    Dispense:  90 tablet    Refill:  2    Tommi Rumps, MD [facility]

## 2017-03-16 NOTE — Progress Notes (Signed)
Pre visit review using our clinic review tool, if applicable. No additional management support is needed unless otherwise documented below in the visit note. 

## 2017-03-16 NOTE — Assessment & Plan Note (Signed)
Slightly above the new goal of 130/80. We will increase her hydrochlorothiazide to 25 mg. She'll continue her other blood pressure medications. She will take two 12.5 mg tablets of her current prescription daily until she runs out and then start on the new prescription. She'll monitor her blood pressure at home. She'll return in 2-3 weeks for blood pressure check and repeat lab work. Advised if she develops lightheadedness with this she should let us know.

## 2017-03-16 NOTE — Assessment & Plan Note (Addendum)
Most recent mammogram negative. Continue to monitor and repeat yearly left breast mammograms. She will check with her insurance company to see if they cover 3-D mammograms.

## 2017-03-16 NOTE — Assessment & Plan Note (Signed)
Diagnosed at most recent physical. She has not changed her dietary habits or started exercising. Discussed trying to minimize fast foods and then starting to walk for exercise 2 days a week. We'll check lab work today.

## 2017-03-29 ENCOUNTER — Telehealth: Payer: Self-pay | Admitting: Radiology

## 2017-03-29 ENCOUNTER — Other Ambulatory Visit: Payer: Self-pay | Admitting: Family Medicine

## 2017-03-29 DIAGNOSIS — I10 Essential (primary) hypertension: Secondary | ICD-10-CM

## 2017-03-29 NOTE — Telephone Encounter (Signed)
I do not see where she needs labs

## 2017-03-29 NOTE — Telephone Encounter (Signed)
PCP is out, CMA doesn't what labs are needed.

## 2017-03-29 NOTE — Telephone Encounter (Signed)
Pt coming in for labs tomorrow, please place future orders. Thank you.  

## 2017-03-30 ENCOUNTER — Ambulatory Visit (INDEPENDENT_AMBULATORY_CARE_PROVIDER_SITE_OTHER): Payer: PPO | Admitting: *Deleted

## 2017-03-30 ENCOUNTER — Other Ambulatory Visit (INDEPENDENT_AMBULATORY_CARE_PROVIDER_SITE_OTHER): Payer: PPO

## 2017-03-30 ENCOUNTER — Encounter: Payer: Self-pay | Admitting: *Deleted

## 2017-03-30 ENCOUNTER — Other Ambulatory Visit: Payer: Self-pay | Admitting: Family Medicine

## 2017-03-30 VITALS — BP 138/74 | HR 80 | Resp 16

## 2017-03-30 DIAGNOSIS — I10 Essential (primary) hypertension: Secondary | ICD-10-CM | POA: Diagnosis not present

## 2017-03-30 LAB — COMPREHENSIVE METABOLIC PANEL
ALBUMIN: 4.6 g/dL (ref 3.5–5.2)
ALK PHOS: 74 U/L (ref 39–117)
ALT: 22 U/L (ref 0–35)
AST: 23 U/L (ref 0–37)
BUN: 20 mg/dL (ref 6–23)
CALCIUM: 9.7 mg/dL (ref 8.4–10.5)
CHLORIDE: 102 meq/L (ref 96–112)
CO2: 28 mEq/L (ref 19–32)
CREATININE: 0.95 mg/dL (ref 0.40–1.20)
GFR: 74.32 mL/min (ref >60.00)
Glucose, Bld: 115 mg/dL — ABNORMAL HIGH (ref 70–99)
POTASSIUM: 3 meq/L — AB (ref 3.5–5.1)
Sodium: 137 mEq/L (ref 135–145)
TOTAL PROTEIN: 7.7 g/dL (ref 6.0–8.3)
Total Bilirubin: 0.4 mg/dL (ref 0.2–1.2)

## 2017-03-30 MED ORDER — POTASSIUM CHLORIDE CRYS ER 20 MEQ PO TBCR: 40.0000 meq | EXTENDED_RELEASE_TABLET | Freq: Two times a day (BID) | ORAL | 0 refills | Status: DC

## 2017-03-30 NOTE — Progress Notes (Signed)
Patient presented for BP check after HCTZ increased to 25 mg on 03/16/17. Patient BP attained in left arm 132/78 pulse 82 , then BP attained in right arm 138/74 pulse 80. BP attained according to nursing standards.

## 2017-03-30 NOTE — Progress Notes (Signed)
Left message to return my call.  

## 2017-03-30 NOTE — Progress Notes (Signed)
BP at goal. Continue current meds.   [facility] [place]

## 2017-04-01 ENCOUNTER — Telehealth: Payer: Self-pay | Admitting: Family Medicine

## 2017-04-01 NOTE — Telephone Encounter (Signed)
Pt called stating returning your call regarding BP check. Thank you!  Call pt @ [phone]. Thank you!

## 2017-04-01 NOTE — Telephone Encounter (Signed)
Looks like you may have called her, please advise, thanks

## 2017-04-01 NOTE — Progress Notes (Signed)
Patient notified see result note 

## 2017-04-01 NOTE — Telephone Encounter (Signed)
See result note.  

## 2017-04-04 ENCOUNTER — Encounter: Payer: Self-pay | Admitting: Family Medicine

## 2017-04-04 ENCOUNTER — Ambulatory Visit (INDEPENDENT_AMBULATORY_CARE_PROVIDER_SITE_OTHER): Payer: PPO | Admitting: Family Medicine

## 2017-04-04 VITALS — BP 122/72 | HR 89 | Temp 98.6°F | Wt 210.6 lb

## 2017-04-04 DIAGNOSIS — M533 Sacrococcygeal disorders, not elsewhere classified: Secondary | ICD-10-CM | POA: Diagnosis not present

## 2017-04-04 DIAGNOSIS — I10 Essential (primary) hypertension: Secondary | ICD-10-CM | POA: Diagnosis not present

## 2017-04-04 LAB — POTASSIUM: Potassium: 3.7 meq/L (ref 3.5–5.1)

## 2017-04-04 MED ORDER — HYDROCHLOROTHIAZIDE 25 MG PO TABS: 12.5000 mg | ORAL_TABLET | Freq: Every day | ORAL | 2 refills | Status: DC

## 2017-04-04 NOTE — Progress Notes (Signed)
  Tommi Rumps, MD Phone: [phone]  Michelle Clark is a 71 y.o. female who presents today for follow-up.  HYPERTENSION  Disease Monitoring  Home BP Monitoring not checking Chest pain- no    Dyspnea- no Medications  Compliance-  Taking HCTZ 12.5 mg, amlodipine, Cozaar, and metoprolol. Her HCTZ dose was decreased from 25 mg given recent hypokalemia. She finished her potassium supplementation 3 days ago.  Edema- no  Low back pain: Patient notes on Saturday she developed discomfort in her right low back. Only hurts if she bends over or when she gets up from a seated position. She notes no injury. She notes no radiation. No numbness, weakness, loss of bowel or bladder function, or saddle anesthesia. 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  ROS see history of present illness  Objective  Physical Exam Vitals:   04/04/17 1300  BP: 122/72  Pulse: 89  Temp: 98.6 F (37 C)    BP Readings from Last 3 Encounters:  04/04/17 122/72  03/30/17 138/74  03/16/17 136/84   Wt Readings from Last 3 Encounters:  04/04/17 210 lb 9.6 oz (95.5 kg)  03/16/17 211 lb 9.6 oz (96 kg)  09/15/16 212 lb 12.8 oz (96.5 kg)    Physical Exam  Constitutional: No distress.  Cardiovascular: Normal rate, regular rhythm and normal heart sounds.   Pulmonary/Chest: Effort normal and breath sounds normal.  Musculoskeletal: She exhibits no edema.  No midline spine tenderness, no midline spine step-off, there is mild tenderness over the right SI joint  Neurological: She is alert. Gait normal.  5 out of 5 in bilateral quads, hamstrings, plantar flexion, and dorsiflexion, sensation light touch intact bilaterally lower extremities  Skin: She is not diaphoretic.     Assessment/Plan: Please see individual problem list.  Essential hypertension, benign At goal. Recently HCTZ was decreased due to hypokalemia. She completed supplementation  and we'll recheck potassium today. She'll continue with her current regimen and start checking at home.  Sacroiliac joint dysfunction of right side Area of discomfort is in the SI joint. Neurologically intact. Has a history of this in the past that typically resolves quickly. Does have a history of breast cancer. Discussed heat and ice. She can use Flexeril as well. She has a prescription at home. Warned that this may make her drowsy. Advised that if this does not improve over the next 1-2 weeks she needs to let us or her sports medicine physician know for further evaluation and consideration of imaging. Given return precautions.   Orders Placed This Encounter  Procedures  . Potassium    Meds ordered this encounter  Medications  . hydrochlorothiazide (HYDRODIURIL) 25 MG tablet    Sig: Take 0.5 tablets (12.5 mg total) by mouth daily.    Dispense:  90 tablet    Refill:  2    Tommi Rumps, MD [place]

## 2017-04-04 NOTE — Assessment & Plan Note (Signed)
At goal. Recently HCTZ was decreased due to hypokalemia. She completed supplementation and we'll recheck potassium today. She'll continue with her current regimen and start checking at home.

## 2017-04-04 NOTE — Assessment & Plan Note (Addendum)
Area of discomfort is in the SI joint. Neurologically intact. Has a history of this in the past that typically resolves quickly. Does have a history of breast cancer. Discussed heat and ice. She can use Flexeril as well. She has a prescription at home. Warned that this may make her drowsy. Advised that if this does not improve over the next 1-2 weeks she needs to let us or her sports medicine physician know for further evaluation and consideration of imaging. Given return precautions.

## 2017-04-04 NOTE — Patient Instructions (Addendum)
Nice to see you. Please start checking your blood pressure at least 3-4 days a week. Your goal is 130/80 or less. We will check your potassium today and contact with results. Please monitor your back discomfort. If it does not improve over the next week or 2 please let us know. You may use the Flexeril for this. The Flexeril may make you drowsy so very. If you develop numbness, weakness, loss of bowel or bladder function, numbness or tingling in her legs, or any new or changing symptoms please seek medical attention immediately.

## 2017-04-05 ENCOUNTER — Telehealth: Payer: Self-pay

## 2017-04-05 NOTE — Telephone Encounter (Signed)
-----   Message from Leone Haven, MD sent at 04/04/2017  5:29 PM EDT ----- Please let the patient know her potassium was normal. She should continue her current dosing of hydrochlorothiazide. Thanks.

## 2017-04-05 NOTE — Telephone Encounter (Signed)
Patient advised of below and verbalized understanding.  

## 2017-04-05 NOTE — Telephone Encounter (Signed)
Left message to return call 

## 2017-04-26 ENCOUNTER — Other Ambulatory Visit: Payer: Self-pay

## 2017-04-26 DIAGNOSIS — I10 Essential (primary) hypertension: Secondary | ICD-10-CM

## 2017-04-26 MED ORDER — AMLODIPINE BESYLATE 5 MG PO TABS: 5.0000 mg | ORAL_TABLET | Freq: Every day | ORAL | 3 refills | Status: DC

## 2017-04-26 MED ORDER — SIMVASTATIN 20 MG PO TABS: 20.0000 mg | ORAL_TABLET | Freq: Every day | ORAL | 3 refills | Status: DC

## 2017-04-26 NOTE — Telephone Encounter (Signed)
Last OV 04/04/17 last filled by DR.Walker Simvastatin 03/16/16 90 3rf Amlodipine 03/16/16 90 3rf

## 2017-05-09 DIAGNOSIS — R000 Tachycardia, unspecified: Secondary | ICD-10-CM | POA: Diagnosis not present

## 2017-05-09 DIAGNOSIS — R002 Palpitations: Secondary | ICD-10-CM | POA: Diagnosis not present

## 2017-05-09 DIAGNOSIS — I10 Essential (primary) hypertension: Secondary | ICD-10-CM | POA: Diagnosis not present

## 2017-05-09 DIAGNOSIS — E785 Hyperlipidemia, unspecified: Secondary | ICD-10-CM | POA: Diagnosis not present

## 2017-05-09 DIAGNOSIS — M1990 Unspecified osteoarthritis, unspecified site: Secondary | ICD-10-CM | POA: Diagnosis not present

## 2017-05-09 DIAGNOSIS — E669 Obesity, unspecified: Secondary | ICD-10-CM | POA: Diagnosis not present

## 2017-05-09 DIAGNOSIS — R600 Localized edema: Secondary | ICD-10-CM | POA: Diagnosis not present

## 2017-05-09 DIAGNOSIS — E876 Hypokalemia: Secondary | ICD-10-CM | POA: Diagnosis not present

## 2017-05-16 ENCOUNTER — Other Ambulatory Visit: Payer: Self-pay

## 2017-05-16 ENCOUNTER — Other Ambulatory Visit: Payer: Self-pay | Admitting: Family Medicine

## 2017-05-16 MED ORDER — LOSARTAN POTASSIUM 100 MG PO TABS: 100.0000 mg | ORAL_TABLET | Freq: Every day | ORAL | 3 refills | Status: DC

## 2017-05-16 NOTE — Telephone Encounter (Signed)
Last filled 05/24/16 by Dr.Walker 90 3rf last OV 04/04/17

## 2017-07-05 ENCOUNTER — Encounter: Payer: Self-pay | Admitting: Family Medicine

## 2017-07-05 ENCOUNTER — Ambulatory Visit (INDEPENDENT_AMBULATORY_CARE_PROVIDER_SITE_OTHER): Payer: PPO | Admitting: Family Medicine

## 2017-07-05 DIAGNOSIS — I10 Essential (primary) hypertension: Secondary | ICD-10-CM | POA: Diagnosis not present

## 2017-07-05 DIAGNOSIS — R7303 Prediabetes: Secondary | ICD-10-CM | POA: Diagnosis not present

## 2017-07-05 DIAGNOSIS — E785 Hyperlipidemia, unspecified: Secondary | ICD-10-CM | POA: Diagnosis not present

## 2017-07-05 LAB — BASIC METABOLIC PANEL
BUN: 14 mg/dL (ref 6–23)
CO2: 29 mEq/L (ref 19–32)
Calcium: 9.7 mg/dL (ref 8.4–10.5)
Chloride: 103 mEq/L (ref 96–112)
Creatinine, Ser: 0.81 mg/dL (ref 0.40–1.20)
GFR: 89.26 mL/min (ref >60.00)
GLUCOSE: 106 mg/dL — AB (ref 70–99)
POTASSIUM: 3.7 meq/L (ref 3.5–5.1)
SODIUM: 138 meq/L (ref 135–145)

## 2017-07-05 MED ORDER — HYDROCHLOROTHIAZIDE 12.5 MG PO TABS: 12.5000 mg | ORAL_TABLET | Freq: Every day | ORAL | 1 refills | Status: DC

## 2017-07-05 NOTE — Progress Notes (Signed)
  Tommi Rumps, MD Phone: [phone]  Michelle Clark is a 71 y.o. female who presents today for f/u.  HYPERTENSION  Disease Monitoring  Home BP Monitoring 952W systolically Chest pain- no    Dyspnea- no Medications  Compliance-  Taking amlodipine, HCTZ, losartan, metoprolol. Lightheadedness-  no  Edema- no  Hyperlipidemia: Taking simvastatin. No right upper quadrant pain or myalgias.  Prediabetes: No polyuria or polydipsia. Notes she has somewhat decreased her fast food and sweet intake. She did get down to 182 pounds though increased her sweet intake again and her weight is back up. She occasionally drinks soda. Tries to eat vegetables with every meal. She's been walking 3 days a week 2-3 laps at the track or walking around her house.  PMH: nonsmoker     ROS see history of present illness  Objective  Physical Exam Vitals:   07/05/17 0901  BP: 124/70  Pulse: 74  Temp: 98.2 F (36.8 C)  SpO2: 96%    BP Readings from Last 3 Encounters:  07/05/17 124/70  04/04/17 122/72  03/30/17 138/74   Wt Readings from Last 3 Encounters:  07/05/17 203 lb 6.4 oz (92.3 kg)  04/04/17 210 lb 9.6 oz (95.5 kg)  03/16/17 211 lb 9.6 oz (96 kg)    Physical Exam  Constitutional: No distress.  Cardiovascular: Normal rate, regular rhythm and normal heart sounds.   Pulmonary/Chest: Effort normal and breath sounds normal.  Musculoskeletal: She exhibits no edema.  Neurological: She is alert. Gait normal.  Skin: She is not diaphoretic.     Assessment/Plan: Please see individual problem list.  Essential hypertension, benign Well-controlled. Had issues on the higher dose of HCTZ with low potassium. We'll recheck a BMP today. We'll send in 12.5 mg tablets of hydrochlorothiazide given she has trouble cutting them. She'll continue her other medications.  Hyperlipidemia Tolerating simvastatin. Continue this medication.  Prediabetes Has started to exercise. Has slightly change her  dietary habits. Discussed further dietary changes. Discussed increasing exercise as well. Plan on rechecking in October at her physical.   Orders Placed This Encounter  Procedures  . Basic Metabolic Panel (BMET)    Meds ordered this encounter  Medications  . hydrochlorothiazide (HYDRODIURIL) 12.5 MG tablet    Sig: Take 1 tablet (12.5 mg total) by mouth daily.    Dispense:  90 tablet    Refill:  1   Tommi Rumps, MD [place]

## 2017-07-05 NOTE — Patient Instructions (Signed)
Nice to see you. Please work on dietary changes as we discussed with decreasing sweets and decreasing fast food intake. Please try to get more exercise by walking 4-5 days a week.

## 2017-07-05 NOTE — Assessment & Plan Note (Signed)
Well-controlled. Had issues on the higher dose of HCTZ with low potassium. We'll recheck a BMP today. We'll send in 12.5 mg tablets of hydrochlorothiazide given she has trouble cutting them. She'll continue her other medications.

## 2017-07-05 NOTE — Assessment & Plan Note (Signed)
Has started to exercise. Has slightly change her dietary habits. Discussed further dietary changes. Discussed increasing exercise as well. Plan on rechecking in October at her physical.

## 2017-07-05 NOTE — Assessment & Plan Note (Signed)
Tolerating simvastatin. Continue this medication.

## 2017-07-22 ENCOUNTER — Telehealth: Payer: Self-pay | Admitting: Family Medicine

## 2017-07-22 NOTE — Telephone Encounter (Signed)
Called pt to schedule AWV. Someone answered but did not speak. I could hear TV in the background. Will try again later.  NOTE* last AWV 09/16/15

## 2017-08-31 NOTE — Telephone Encounter (Signed)
Left pt message asking to call Ebony Hail back directly at [phone] to schedule AWV. Thanks!  *NOTE* Last AWV 09/16/15; pt due anytime

## 2017-09-15 ENCOUNTER — Ambulatory Visit (INDEPENDENT_AMBULATORY_CARE_PROVIDER_SITE_OTHER): Payer: PPO | Admitting: Family Medicine

## 2017-09-15 ENCOUNTER — Encounter: Payer: Self-pay | Admitting: Family Medicine

## 2017-09-15 ENCOUNTER — Ambulatory Visit (INDEPENDENT_AMBULATORY_CARE_PROVIDER_SITE_OTHER): Payer: PPO

## 2017-09-15 VITALS — BP 130/78 | HR 82 | Temp 98.1°F | Ht 66.0 in | Wt 205.0 lb

## 2017-09-15 VITALS — BP 130/78 | HR 82 | Temp 98.1°F | Ht 66.0 in | Wt 205.8 lb

## 2017-09-15 DIAGNOSIS — Z0000 Encounter for general adult medical examination without abnormal findings: Secondary | ICD-10-CM | POA: Diagnosis not present

## 2017-09-15 DIAGNOSIS — E785 Hyperlipidemia, unspecified: Secondary | ICD-10-CM

## 2017-09-15 DIAGNOSIS — Z1239 Encounter for other screening for malignant neoplasm of breast: Secondary | ICD-10-CM

## 2017-09-15 DIAGNOSIS — Z1231 Encounter for screening mammogram for malignant neoplasm of breast: Secondary | ICD-10-CM | POA: Diagnosis not present

## 2017-09-15 DIAGNOSIS — Z23 Encounter for immunization: Secondary | ICD-10-CM | POA: Diagnosis not present

## 2017-09-15 DIAGNOSIS — Z853 Personal history of malignant neoplasm of breast: Secondary | ICD-10-CM

## 2017-09-15 DIAGNOSIS — I10 Essential (primary) hypertension: Secondary | ICD-10-CM

## 2017-09-15 DIAGNOSIS — M75102 Unspecified rotator cuff tear or rupture of left shoulder, not specified as traumatic: Secondary | ICD-10-CM

## 2017-09-15 DIAGNOSIS — Z1331 Encounter for screening for depression: Secondary | ICD-10-CM

## 2017-09-15 DIAGNOSIS — R7303 Prediabetes: Secondary | ICD-10-CM

## 2017-09-15 LAB — COMPREHENSIVE METABOLIC PANEL
ALBUMIN: 4.7 g/dL (ref 3.5–5.2)
ALK PHOS: 79 U/L (ref 39–117)
ALT: 19 U/L (ref 0–35)
AST: 22 U/L (ref 0–37)
BILIRUBIN TOTAL: 0.5 mg/dL (ref 0.2–1.2)
BUN: 15 mg/dL (ref 6–23)
CALCIUM: 10.1 mg/dL (ref 8.4–10.5)
CO2: 29 meq/L (ref 19–32)
CREATININE: 0.82 mg/dL (ref 0.40–1.20)
Chloride: 101 mEq/L (ref 96–112)
GFR: 87.96 mL/min (ref >60.00)
Glucose, Bld: 98 mg/dL (ref 70–99)
Potassium: 3.6 mEq/L (ref 3.5–5.1)
Sodium: 137 mEq/L (ref 135–145)
TOTAL PROTEIN: 8.1 g/dL (ref 6.0–8.3)

## 2017-09-15 LAB — HEMOGLOBIN A1C: Hgb A1c MFr Bld: 6 % (ref 4.6–6.5)

## 2017-09-15 LAB — LIPID PANEL
CHOLESTEROL: 146 mg/dL (ref 0–200)
HDL: 54.6 mg/dL (ref >39.00)
LDL Cholesterol: 73 mg/dL (ref 0–99)
NONHDL: 91.86
Total CHOL/HDL Ratio: 3
Triglycerides: 96 mg/dL (ref 0.0–149.0)
VLDL: 19.2 mg/dL (ref 0.0–40.0)

## 2017-09-15 NOTE — Progress Notes (Signed)
Subjective:   Michelle Clark is a 71 y.o. female who presents for Medicare Annual (Subsequent) preventive examination.  Review of Systems:  No ROS.  Medicare Wellness Visit. Additional risk factors are reflected in the social history.  Cardiac Risk Factors include: advanced age (>58men, >29 women);obesity (BMI >30kg/m2);hypertension     Objective:     Vitals: BP 130/78   Pulse 82   Temp 98.1 F (36.7 C) (Oral)   Ht 5\' 6"  (1.676 m)   Wt 205 lb 12.8 oz (93.4 kg)   SpO2 96%   BMI 33.22 kg/m   Body mass index is 33.22 kg/m.   Tobacco History  Smoking Status  . Former Smoker  . Quit date: 04/10/1997  Smokeless Tobacco  . Never Used     Counseling given: Not Answered   Past Medical History:  Diagnosis Date  . Cancer [facility]) 2001   Right breast, s/p chemotherapy, XRT and mastectomy, Dr. Jeb Levering  . Colon polyps   . Hyperlipidemia   . Hypertension    Past Surgical History:  Procedure Laterality Date  . ABDOMINAL HYSTERECTOMY  1998   cervix intact  . APPENDECTOMY    . BREAST SURGERY  2002   mastectomy  . CHOLECYSTECTOMY    . MASTECTOMY Right 2001   chemo, rad  . TUBAL LIGATION     Family History  Problem Relation Age of Onset  . Arthritis Mother   . Arthritis Sister   . Stroke Sister        paralyzed left side  . Breast cancer Sister 22  . Cancer Sister        breast  . Heart disease Brother   . Asthma Brother   . Breast cancer Other    History  Sexual Activity  . Sexual activity: Not Currently    Outpatient Encounter Prescriptions as of 09/15/2017  Medication Sig  . amLODipine (NORVASC) 5 MG tablet Take 1 tablet (5 mg total) by mouth daily.  Marland Kitchen aspirin 81 MG tablet Take 81 mg by mouth daily.  . Cholecalciferol (VITAMIN D) 2000 units tablet Take 1 tablet by mouth daily.  . cyclobenzaprine (FLEXERIL) 5 MG tablet TAKE 1 TABLET BY MOUTH EVERY NIGHT AT BEDTIME AS NEEDED FOR MUSCLE SPASMS  . hydrochlorothiazide (HYDRODIURIL) 12.5 MG tablet Take 1  tablet (12.5 mg total) by mouth daily.  Marland Kitchen losartan (COZAAR) 100 MG tablet Take 1 tablet (100 mg total) by mouth daily.  . metoprolol succinate (TOPROL-XL) 50 MG 24 hr tablet TAKE 1 TABLET BY MOUTH DAILY WITH OR IMMEDIATELY FOLLOWING A MEAL  . Multiple Vitamin (MULTIVITAMIN) tablet Take 1 tablet by mouth daily.  . naproxen sodium (ANAPROX) 220 MG tablet Take 220 mg by mouth 2 (two) times daily with a meal.  . simvastatin (ZOCOR) 20 MG tablet Take 1 tablet (20 mg total) by mouth daily.   No facility-administered encounter medications on file as of 09/15/2017.     Activities of Daily Living In your present state of health, do you have any difficulty performing the following activities: 09/15/2017  Hearing? N  Vision? N  Difficulty concentrating or making decisions? N  Walking or climbing stairs? N  Dressing or bathing? N  Doing errands, shopping? N  Preparing Food and eating ? N  Using the Toilet? N  In the past six months, have you accidently leaked urine? N  Do you have problems with loss of bowel control? N  Managing your Medications? N  Managing your Finances? N  Housekeeping  or managing your Housekeeping? N  Some recent data might be hidden    Patient Care Team: Michelle Haven, MD as PCP - General (Family Medicine)    Assessment:    This is a routine wellness examination for [name]. The goal of the wellness visit is to assist the patient how to close the gaps in care and create a preventative care plan for the patient.   The roster of all physicians providing medical care to patient is listed in the Snapshot section of the chart.  Taking calcium VIT D as appropriate/Osteoporosis risk reviewed.    Safety issues reviewed; Smoke and carbon monoxide detectors in the home. No firearms in the home.  Wears seatbelts when driving or riding with others. Patient does wear sunscreen or protective clothing when in direct sunlight. No violence in the home.  Depression- PHQ 2 &9  complete.  No signs/symptoms or verbal communication regarding little pleasure in doing things or hopeless. She has concerns regarding the health of her son and husband that she finds makes it hard for her to sleep every once in awhile.  States she prays and it always makes her feel better.  No changes in sleeping, energy, eating, concentrating.  No thoughts of self harm or harm towards others.  Time spent on this topic is 10 minutes.   Patient is alert, normal appearance, oriented to person/place/and time.  Correctly identified the president of the [place], recall of 3/3 words, and performing simple calculations. Displays appropriate judgement and can read correct time from watch face.   No new identified risk were noted.  No failures at ADL's or IADL's.   Employed part time with janitorial duties.  BMI- discussed the importance of a healthy diet, water intake and the benefits of aerobic exercise. Educational material provided.   24 hour diet recall: Breakfast: sausage, egg, toast Lunch: Peanut butter sandwich Dinner: salmon patties, stewed potatoes Snack: cookie  Daily fluid intake: 1 cups of caffeine, 4-6 cups of water  Eye- Visual acuity not assessed per patient preference since they have regular follow up with the ophthalmologist.  Wears corrective lenses.  Sleep patterns- Sleeps 6 hours at night.  Wakes feeling rested.  High dose influenza administered R deltoid, tolerated well. Educational material provided.  Patient Concerns: None at this time. Follow up with PCP as needed.  Exercise Activities and Dietary recommendations Current Exercise Habits: Home exercise routine, Type of exercise: walking, Time (Minutes): 60, Frequency (Times/Week): 1, Weekly Exercise (Minutes/Week): 60, Intensity: Mild  Goals    . Increase lean proteins          Low carb foods    . Increase physical therapy          Walk for exercise 3 days a week for 30 minutes      Fall Risk Fall Risk   09/15/2017 03/16/2017 09/16/2015 09/02/2014 10/15/2013  Falls in the past year? No No No No No   Depression Screen PHQ 2/9 Scores 09/15/2017 03/16/2017 09/16/2015 09/02/2014  PHQ - 2 Score 1 0 0 0  PHQ- 9 Score 2 - - -     Cognitive Function MMSE - Mini Mental State Exam 09/15/2017  Orientation to time 5  Orientation to Place 5  Registration 3  Attention/ Calculation 5  Recall 3  Language- name 2 objects 2  Language- repeat 1  Language- follow 3 step command 3  Language- read & follow direction 1  Write a sentence 1  Copy design 1  Total score  30        Immunization History  Administered Date(s) Administered  . Influenza, High Dose Seasonal PF 09/15/2016, 09/15/2017  . Influenza,inj,Quad PF,6+ Mos 10/15/2013, 09/02/2014, 09/16/2015  . Influenza-Unspecified 09/10/2012  . Pneumococcal Conjugate-13 04/19/2014  . Pneumococcal Polysaccharide-23 04/10/2013  . Tdap 09/15/2016  . Zoster 04/11/2011   Screening Tests Health Maintenance  Topic Date Due  . MAMMOGRAM  11/16/2018  . COLONOSCOPY  03/11/2020  . TETANUS/TDAP  09/15/2026  . INFLUENZA VACCINE  Completed  . DEXA SCAN  Completed  . Hepatitis C Screening  Completed      Plan:    End of life planning; Advance aging; Advanced directives discussed. Copy of current HCPOA/Living Will requested.    I have personally reviewed and noted the following in the patient's chart:   . Medical and social history . Use of alcohol, tobacco or illicit drugs  . Current medications and supplements . Functional ability and status . Nutritional status . Physical activity . Advanced directives . List of other physicians . Hospitalizations, surgeries, and ER visits in previous 12 months . Vitals . Screenings to include cognitive, depression, and falls . Referrals and appointments  In addition, I have reviewed and discussed with patient certain preventive protocols, quality metrics, and best practice recommendations. A written  personalized care plan for preventive services as well as general preventive health recommendations were provided to patient.     Varney Biles, LPN  [id number]

## 2017-09-15 NOTE — Assessment & Plan Note (Signed)
History of tear. Slight discomfort now. She'll do exercises. Consider injection if not improving.

## 2017-09-15 NOTE — Assessment & Plan Note (Signed)
Due for mammogram. Ordered.

## 2017-09-15 NOTE — Assessment & Plan Note (Signed)
Encourage exercise and diet. Check A1c.

## 2017-09-15 NOTE — Patient Instructions (Addendum)
Nice to see you. Please try the exercises for your left shoulder. We'll get lab work and contact you with the results.   Shoulder Impingement Syndrome Rehab Ask your health care provider which exercises are safe for you. Do exercises exactly as told by your health care provider and adjust them as directed. It is normal to feel mild stretching, pulling, tightness, or discomfort as you do these exercises, but you should stop right away if you feel sudden pain or your pain gets worse.Do not begin these exercises until told by your health care provider. Stretching and range of motion exercise This exercise warms up your muscles and joints and improves the movement and flexibility of your shoulder. This exercise also helps to relieve pain and stiffness. Exercise A: Passive horizontal adduction  1. Sit or stand and pull your left / right elbow across your chest, toward your other shoulder. Stop when you feel a gentle stretch in the back of your shoulder and upper arm. ? Keep your arm at shoulder height. ? Keep your arm as close to your body as you comfortably can. 2. Hold for __________ seconds. 3. Slowly return to the starting position. Repeat __________ times. Complete this exercise __________ times a day. Strengthening exercises These exercises build strength and endurance in your shoulder. Endurance is the ability to use your muscles for a long time, even after they get tired. Exercise B: External rotation, isometric 1. Stand or sit in a doorway, facing the door frame. 2. Bend your left / right elbow and place the back of your wrist against the door frame. Only your wrist should be touching the frame. Keep your upper arm at your side. 3. Gently press your wrist against the door frame, as if you are trying to push your arm away from your abdomen. ? Avoid shrugging your shoulder while you press your hand against the door frame. Keep your shoulder blade tucked down toward the middle of your  back. 4. Hold for __________ seconds. 5. Slowly release the tension, and relax your muscles completely before you do the exercise again. Repeat __________ times. Complete this exercise __________ times a day. Exercise C: Internal rotation, isometric  1. Stand or sit in a doorway, facing the door frame. 2. Bend your left / right elbow and place the inside of your wrist against the door frame. Only your wrist should be touching the frame. Keep your upper arm at your side. 3. Gently press your wrist against the door frame, as if you are trying to push your arm toward your abdomen. ? Avoid shrugging your shoulder while you press your hand against the door frame. Keep your shoulder blade tucked down toward the middle of your back. 4. Hold for __________ seconds. 5. Slowly release the tension, and relax your muscles completely before you do the exercise again. Repeat __________ times. Complete this exercise __________ times a day. Exercise D: Scapular protraction, supine  1. Lie on your back on a firm surface. Hold a __________ weight in your left / right hand. 2. Raise your left / right arm straight into the air so your hand is directly above your shoulder joint. 3. Push the weight into the air so your shoulder lifts off of the surface that you are lying on. Do not move your head, neck, or back. 4. Hold for __________ seconds. 5. Slowly return to the starting position. Let your muscles relax completely before you repeat this exercise. Repeat __________ times. Complete this exercise __________ times a  day. Exercise E: Scapular retraction  1. Sit in a stable chair without armrests, or stand. 2. Secure an exercise band to a stable object in front of you so the band is at shoulder height. 3. Hold one end of the exercise band in each hand. Your palms should face down. 4. Squeeze your shoulder blades together and move your elbows slightly behind you. Do not shrug your shoulders while you do  this. 5. Hold for __________ seconds. 6. Slowly return to the starting position. Repeat __________ times. Complete this exercise __________ times a day. Exercise F: Shoulder extension  1. Sit in a stable chair without armrests, or stand. 2. Secure an exercise band to a stable object in front of you where the band is above shoulder height. 3. Hold one end of the exercise band in each hand. 4. Straighten your elbows and lift your hands up to shoulder height. 5. Squeeze your shoulder blades together and pull your hands down to the sides of your thighs. Stop when your hands are straight down by your sides. Do not let your hands go behind your body. 6. Hold for __________ seconds. 7. Slowly return to the starting position. Repeat __________ times. Complete this exercise __________ times a day. This information is not intended to replace advice given to you by your health care provider. Make sure you discuss any questions you have with your health care provider. Document Released: 11/08/2005 Document Revised: 07/15/2016 Document Reviewed: 10/11/2015 Elsevier Interactive Patient Education  [employer].

## 2017-09-15 NOTE — Patient Instructions (Addendum)
  Michelle Clark , Thank you for taking time to come for your Medicare Wellness Visit. I appreciate your ongoing commitment to your health goals. Please review the following plan we discussed and let me know if I can assist you in the future.   Follow up with Dr. Caryl Bis as needed.    Bring a copy of your [place] and/or Living Will to be scanned into chart.  Have a great day!  These are the goals we discussed: Goals    . Increase lean proteins          Low carb foods    . Increase physical therapy          Walk for exercise 3 days a week for 30 minutes       This is a list of the screening recommended for you and due dates:  Health Maintenance  Topic Date Due  . Mammogram  11/16/2018  . Colon Cancer Screening  03/11/2020  . Tetanus Vaccine  09/15/2026  . Flu Shot  Completed  . DEXA scan (bone density measurement)  Completed  .  Hepatitis C: One time screening is recommended by Center for Disease Control  (CDC) for  adults born from [birth year] through 1966.   Completed

## 2017-09-15 NOTE — Assessment & Plan Note (Signed)
At goal. Continue current medication. 

## 2017-09-15 NOTE — Progress Notes (Signed)
  Tommi Rumps, MD Phone: [phone]  Michelle Clark is a 71 y.o. female who presents today for f/u.  HYPERTENSION Disease Monitoring: Blood pressure range-not checking Chest pain- no      Dyspnea- no Medications: Compliance- taking amlodipine, HCTZ, losartan, metoprolol   Edema- no  Prediabetes: She's not been working very much on exercise those can start walking more consistently. She's cut out some fast foods. Only doing that 1-2 times a week. Decreased bread. For vegetables. Not much fried food.  HYPERLIPIDEMIA Disease Monitoring: See symptoms for Hypertension Medications: Compliance- taking simvastatin Right upper quadrant pain- no  Muscle aches- no  Left shoulder pain: Notes a couple years ago she was diagnosed with a slight rotator cuff tear. She had an injection by Dr. Tamala Julian. Notes over the last several weeks she's noted a slight discomfort only when she lifts things that are heavy. No issues with range of motion causing pain. Has been taking some Tylenol.    PMH: Former smoker   ROS see history of present illness  Objective  Physical Exam Vitals:   09/15/17 0926  BP: 130/78  Pulse: 82  Temp: 98.1 F (36.7 C)  SpO2: 96%    BP Readings from Last 3 Encounters:  09/15/17 130/78  09/15/17 130/78  07/05/17 124/70   Wt Readings from Last 3 Encounters:  09/15/17 205 lb (93 kg)  09/15/17 205 lb 12.8 oz (93.4 kg)  07/05/17 203 lb 6.4 oz (92.3 kg)    Physical Exam  Constitutional: No distress.  Cardiovascular: Normal rate, regular rhythm and normal heart sounds.   Pulmonary/Chest: Effort normal and breath sounds normal.  Musculoskeletal:  Bilateral shoulders symmetric, nontender, full range of motion with no discomfort, slight discomfort on empty can on the left, negative empty can on the right, negative speeds  Neurological: She is alert. Gait normal.  5/5 strength bilateral biceps, triceps, and grip, sensation light touch intact bilateral upper  extremities  Skin: Skin is warm and dry. She is not diaphoretic.     Assessment/Plan: Please see individual problem list.  Left rotator cuff tear History of tear. Slight discomfort now. She'll do exercises. Consider injection if not improving.  Essential hypertension, benign At goal. Continue current medication.  Prediabetes Encourage exercise and diet. Check A1c.  Hyperlipidemia Check lipid panel. Continue simvastatin.  History of breast cancer Due for mammogram. Ordered.   Orders Placed This Encounter  Procedures  . MM SCREENING BREAST TOMO UNI L    Standing Status:   Future    Standing Expiration Date:   11/15/2018    Order Specific Question:   Reason for Exam (SYMPTOM  OR DIAGNOSIS REQUIRED)    Answer:   history right breast cancer s/p right mastectomy    Order Specific Question:   Preferred imaging location?    Answer:   [facility]  . Lipid Profile  . HgB A1c  . Comp Met (CMET)    Tommi Rumps, MD [place]

## 2017-09-15 NOTE — Assessment & Plan Note (Signed)
Check lipid panel.  Continue simvastatin. 

## 2017-09-21 ENCOUNTER — Other Ambulatory Visit: Payer: Self-pay | Admitting: Family Medicine

## 2017-11-18 ENCOUNTER — Ambulatory Visit
Admission: RE | Admit: 2017-11-18 | Discharge: 2017-11-18 | Disposition: A | Discharge status: Home / Self Care | Payer: PPO | Source: Ambulatory Visit | Attending: Family Medicine | Admitting: Family Medicine

## 2017-11-18 DIAGNOSIS — Z1231 Encounter for screening mammogram for malignant neoplasm of breast: Secondary | ICD-10-CM | POA: Diagnosis not present

## 2017-11-18 DIAGNOSIS — Z1239 Encounter for other screening for malignant neoplasm of breast: Secondary | ICD-10-CM

## 2017-11-18 HISTORY — DX: Personal history of irradiation: Z92.3

## 2017-11-18 HISTORY — DX: Personal history of antineoplastic chemotherapy: Z92.21

## 2018-01-23 ENCOUNTER — Other Ambulatory Visit: Payer: Self-pay | Admitting: Family Medicine

## 2018-02-15 ENCOUNTER — Other Ambulatory Visit: Payer: Self-pay | Admitting: Family Medicine

## 2018-03-09 ENCOUNTER — Other Ambulatory Visit: Payer: Self-pay | Admitting: Family Medicine

## 2018-03-16 ENCOUNTER — Ambulatory Visit: Payer: PPO | Admitting: Family Medicine

## 2018-03-24 ENCOUNTER — Ambulatory Visit: Payer: PPO | Admitting: Family Medicine

## 2018-03-28 ENCOUNTER — Other Ambulatory Visit: Payer: Self-pay | Admitting: Family Medicine

## 2018-03-28 DIAGNOSIS — I10 Essential (primary) hypertension: Secondary | ICD-10-CM

## 2018-04-10 ENCOUNTER — Telehealth: Payer: Self-pay | Admitting: Family Medicine

## 2018-04-10 ENCOUNTER — Other Ambulatory Visit: Payer: Self-pay

## 2018-04-10 ENCOUNTER — Ambulatory Visit (INDEPENDENT_AMBULATORY_CARE_PROVIDER_SITE_OTHER): Payer: PPO | Admitting: Family Medicine

## 2018-04-10 ENCOUNTER — Encounter: Payer: Self-pay | Admitting: Family Medicine

## 2018-04-10 VITALS — BP 126/70 | HR 72 | Temp 98.3°F | Ht 66.0 in | Wt 205.8 lb

## 2018-04-10 DIAGNOSIS — Z853 Personal history of malignant neoplasm of breast: Secondary | ICD-10-CM | POA: Diagnosis not present

## 2018-04-10 DIAGNOSIS — E785 Hyperlipidemia, unspecified: Secondary | ICD-10-CM

## 2018-04-10 DIAGNOSIS — R222 Localized swelling, mass and lump, trunk: Secondary | ICD-10-CM

## 2018-04-10 DIAGNOSIS — R7303 Prediabetes: Secondary | ICD-10-CM

## 2018-04-10 DIAGNOSIS — R5383 Other fatigue: Secondary | ICD-10-CM | POA: Insufficient documentation

## 2018-04-10 DIAGNOSIS — M62838 Other muscle spasm: Secondary | ICD-10-CM | POA: Diagnosis not present

## 2018-04-10 DIAGNOSIS — I10 Essential (primary) hypertension: Secondary | ICD-10-CM

## 2018-04-10 LAB — COMPREHENSIVE METABOLIC PANEL
ALBUMIN: 4.6 g/dL (ref 3.5–5.2)
ALT: 19 U/L (ref 0–35)
AST: 20 U/L (ref 0–37)
Alkaline Phosphatase: 78 U/L (ref 39–117)
BUN: 18 mg/dL (ref 6–23)
CO2: 27 mEq/L (ref 19–32)
Calcium: 10 mg/dL (ref 8.4–10.5)
Chloride: 100 mEq/L (ref 96–112)
Creatinine, Ser: 0.83 mg/dL (ref 0.40–1.20)
GFR: 86.6 mL/min (ref >60.00)
GLUCOSE: 95 mg/dL (ref 70–99)
POTASSIUM: 3.4 meq/L — AB (ref 3.5–5.1)
SODIUM: 136 meq/L (ref 135–145)
Total Bilirubin: 0.5 mg/dL (ref 0.2–1.2)
Total Protein: 8.4 g/dL — ABNORMAL HIGH (ref 6.0–8.3)

## 2018-04-10 LAB — CBC WITH DIFFERENTIAL/PLATELET
BASOS ABS: 0.1 10*3/uL (ref 0.0–0.1)
BASOS PCT: 1.1 % (ref 0.0–3.0)
EOS ABS: 0.1 10*3/uL (ref 0.0–0.7)
Eosinophils Relative: 1.9 % (ref 0.0–5.0)
HEMATOCRIT: 41.5 % (ref 36.0–46.0)
Hemoglobin: 13.7 g/dL (ref 12.0–15.0)
Lymphocytes Relative: 35.8 % (ref 12.0–46.0)
Lymphs Abs: 2.8 10*3/uL (ref 0.7–4.0)
MCHC: 33.1 g/dL (ref 30.0–36.0)
MCV: 86.6 fl (ref 78.0–100.0)
Monocytes Absolute: 0.7 10*3/uL (ref 0.1–1.0)
Monocytes Relative: 8.7 % (ref 3.0–12.0)
NEUTROS ABS: 4.1 10*3/uL (ref 1.4–7.7)
NEUTROS PCT: 52.5 % (ref 43.0–77.0)
PLATELETS: 248 10*3/uL (ref 150.0–400.0)
RBC: 4.79 Mil/uL (ref 3.87–5.11)
RDW: 13.6 % (ref 11.5–15.5)
WBC: 7.8 10*3/uL (ref 4.0–10.5)

## 2018-04-10 LAB — VITAMIN B12: VITAMIN B 12: 891 pg/mL (ref 211–911)

## 2018-04-10 LAB — HEMOGLOBIN A1C: Hgb A1c MFr Bld: 5.9 % (ref 4.6–6.5)

## 2018-04-10 LAB — TSH: TSH: 3.48 u[IU]/mL (ref 0.35–4.50)

## 2018-04-10 MED ORDER — AMLODIPINE BESYLATE 5 MG PO TABS: 5.0000 mg | ORAL_TABLET | Freq: Every day | ORAL | 3 refills | Status: DC

## 2018-04-10 MED ORDER — CYCLOBENZAPRINE HCL 5 MG PO TABS: ORAL_TABLET | ORAL | 1 refills | Status: DC

## 2018-04-10 MED ORDER — LOSARTAN POTASSIUM 100 MG PO TABS: 100.0000 mg | ORAL_TABLET | Freq: Every day | ORAL | 3 refills | Status: DC

## 2018-04-10 MED ORDER — SIMVASTATIN 20 MG PO TABS: 20.0000 mg | ORAL_TABLET | Freq: Every day | ORAL | 3 refills | Status: DC

## 2018-04-10 NOTE — Telephone Encounter (Signed)
-----   Message from Lloyd Huger, MD sent at 04/10/2018  1:10 PM EDT ----- Actually an u/s may be the best to start with.  You can order a biopsy with it in case they do note an abnormalities.  Let me know if you need anything else!  -Tim  ----- Message ----- From: Leone Haven, MD Sent: 04/10/2018  12:53 PM To: Lloyd Huger, MD  Jaynie Collins,  I saw Mrs Mcinroy today for follow-up. I believe she saw you at some point in the distant past after having seen Dr Oliva Bustard. I wanted to see if you could help me with a question. I did a breast exam today given her history of breast cancer. She is status post right mastectomy in 2001 and it appears she received chemo and radiation. She noted an area of concern in the right chest wall and I noted a slight firmness just over the lateral portion of the chest near the axilla that was difficult for me to tell if it was just musculature or an underlying lesion. What would be the best way to evaluate this area given her prior mastectomy? Would it be CT scan, MRI, or follow-up with you? She has had consistent screening mammograms of the left breast. Thanks for your help.   Randall Hiss

## 2018-04-10 NOTE — Patient Instructions (Signed)
Nice to see you. We will get lab work today and contact you with the results. We will contact your prior oncologist to get their input on the area in your right chest.  If you do not hear from [place] in the next week regarding this please let us know.

## 2018-04-10 NOTE — Assessment & Plan Note (Signed)
Check A1c. 

## 2018-04-10 NOTE — Progress Notes (Signed)
Tommi Rumps, MD Phone: [phone]  Michelle Clark is a 72 y.o. female who presents today for f/u.  CC: htn, hld, history breast cancer, fatigue  HYPERTENSION  Disease Monitoring  Home BP Monitoring 120/70 Chest pain- no    Dyspnea- no Medications  Compliance-  Taking amlodipine, HCTZ, losartan, metoprolol.  Edema- occasional at end of the day, no orthopnea or PND  HYPERLIPIDEMIA Symptoms Chest pain on exertion:  no   Medications: Compliance- taking simvastatin Right upper quadrant pain- no  Muscle aches- no  She does note over the last several weeks she has felt a little fatigued at the end of the day.  Just feels tired.  No exertional symptoms.  She stopped her multivitamin previously and wonders if it was related to that.  No hair changes.  No dry skin.  No history of thyroid issues.  She does note some mild depression and stress related to caring for her husband and son.  No SI.  She intermittently takes Flexeril if she gets a spasm in her neck.  Does not take it very frequently.  She has a history of breast cancer status post right mastectomy.  Occasionally she will get a spasm in the musculature of her chest that will go away fairly quickly.  She gets concerned about that given history of breast cancer.  Left-sided mammogram up-to-date.  Previously was having lab work for follow-up of her breast cancer.    Social History   Tobacco Use  Smoking Status Former Smoker  . Last attempt to quit: 04/10/1997  . Years since quitting: 21.0  Smokeless Tobacco Never Used     ROS see history of present illness  Objective  Physical Exam Vitals:   04/10/18 0918  BP: 126/70  Pulse: 72  Temp: 98.3 F (36.8 C)  SpO2: 98%    BP Readings from Last 3 Encounters:  04/10/18 126/70  09/15/17 130/78  09/15/17 130/78   Wt Readings from Last 3 Encounters:  04/10/18 205 lb 12.8 oz (93.4 kg)  09/15/17 205 lb (93 kg)  09/15/17 205 lb 12.8 oz (93.4 kg)    Physical Exam    Constitutional: No distress.  Cardiovascular: Normal rate, regular rhythm and normal heart sounds.  Pulmonary/Chest: Effort normal and breath sounds normal.  Genitourinary:  Genitourinary Comments: Chaperone used, right chest with evidence of mastectomy, there is a slight firmness just over the lateral portion of the chest near the axilla that is difficult to tell if this is just musculature or some underlying lesion, there is no tenderness, there is no overlying skin changes no other palpable abnormalities in the right chest, no axillary masses on the right side, left breast with no skin changes, nipple inversion, masses, or tenderness, no axillary masses on the left  Musculoskeletal: She exhibits no edema.  Neurological: She is alert.  Skin: Skin is warm and dry. She is not diaphoretic.     Assessment/Plan: Please see individual problem list.  Essential hypertension, benign Adequately controlled.  Continue current regimen.  History of breast cancer Breast exam completed today.  Question potential lesion or underlying musculature on the right chest.  We will send a message to her prior oncologist regarding the best way to evaluate this.  Patient was interested in follow-up CA 27.29 testing.  This was ordered.  Prediabetes Check A1c.  Muscle spasms of neck Patient with rare spasms in her neck.  Have responded to Flexeril in the past.  Refill given.  Fatigue I suspect this is most  likely related to mild depression and stress from caring for her husband and son.  We will check lab work.  Consider treatment with SSRI or therapy for lab work is unremarkable.   Orders Placed This Encounter  Procedures  . Comp Met (CMET)  . TSH  . CBC w/Diff  . B12  . Cancer antigen 27.29  . HgB A1c    Meds ordered this encounter  Medications  . cyclobenzaprine (FLEXERIL) 5 MG tablet    Sig: TAKE 1 TABLET BY MOUTH EVERY NIGHT AT BEDTIME AS NEEDED FOR MUSCLE SPASMS    Dispense:  30 tablet     Refill:  1  . amLODipine (NORVASC) 5 MG tablet    Sig: Take 1 tablet (5 mg total) by mouth daily.    Dispense:  90 tablet    Refill:  3  . losartan (COZAAR) 100 MG tablet    Sig: Take 1 tablet (100 mg total) by mouth daily.    Dispense:  90 tablet    Refill:  3  . simvastatin (ZOCOR) 20 MG tablet    Sig: Take 1 tablet (20 mg total) by mouth daily.    Dispense:  90 tablet    Refill:  [place], MD [place]

## 2018-04-10 NOTE — Assessment & Plan Note (Signed)
Adequately controlled.  Continue current regimen. 

## 2018-04-10 NOTE — Assessment & Plan Note (Signed)
Continue simvastatin. 

## 2018-04-10 NOTE — Telephone Encounter (Signed)
Please let the patient know I heard back from oncology.  They noted an ultrasound would probably be the best thing to start with.  I have ordered this and it will be scheduled for the patient.  Thanks.

## 2018-04-10 NOTE — Assessment & Plan Note (Signed)
I suspect this is most likely related to mild depression and stress from caring for her husband and son.  We will check lab work.  Consider treatment with SSRI or therapy for lab work is unremarkable.

## 2018-04-10 NOTE — Assessment & Plan Note (Signed)
Patient with rare spasms in her neck.  Have responded to Flexeril in the past.  Refill given.

## 2018-04-10 NOTE — Assessment & Plan Note (Signed)
Breast exam completed today.  Question potential lesion or underlying musculature on the right chest.  We will send a message to her prior oncologist regarding the best way to evaluate this.  Patient was interested in follow-up CA 27.29 testing.  This was ordered.

## 2018-04-11 LAB — CANCER ANTIGEN 27.29: CA 27.29: 23 U/mL (ref <38)

## 2018-04-11 NOTE — Telephone Encounter (Signed)
Patient notified of result notes entered by Dr Caryl Bis on May 20 at [place]

## 2018-04-11 NOTE — Telephone Encounter (Signed)
Left message to return call, ok for pec to speak to patient about message below 

## 2018-04-12 ENCOUNTER — Telehealth: Payer: Self-pay

## 2018-04-12 DIAGNOSIS — E876 Hypokalemia: Secondary | ICD-10-CM

## 2018-04-12 NOTE — Telephone Encounter (Signed)
-----   Message from Leone Haven, MD sent at 04/10/2018  5:16 PM EDT ----- Please let the patient know that her potassium is minimally low.  She needs to increase potassium rich foods in her diet and we should recheck in about a week.  Please place an order for a CMP for hypokalemia.  There is no obvious cause for her fatigue on her lab work.  This may be related to her stress and depression.  We could consider treatment for that if she would like with medication or therapy.  Thanks.

## 2018-04-19 ENCOUNTER — Telehealth: Payer: Self-pay | Admitting: Family Medicine

## 2018-04-19 ENCOUNTER — Other Ambulatory Visit: Payer: Self-pay | Admitting: Family Medicine

## 2018-04-19 ENCOUNTER — Ambulatory Visit: Payer: PPO

## 2018-04-19 DIAGNOSIS — R222 Localized swelling, mass and lump, trunk: Secondary | ICD-10-CM

## 2018-04-19 MED ORDER — SERTRALINE HCL 50 MG PO TABS: 50.0000 mg | ORAL_TABLET | Freq: Every day | ORAL | 3 refills | Status: DC

## 2018-04-19 NOTE — Addendum Note (Signed)
Addended by: Leone Haven on: 04/19/2018 04:57 PM   Modules accepted: Orders

## 2018-04-19 NOTE — Telephone Encounter (Signed)
Zoloft sent to pharmacy.  If her depression worsens she needs to let us know.  If she develops thoughts of hurting herself she needs to go the emergency room.  She needs follow-up in 2 to 3 months for this issue.

## 2018-04-19 NOTE — Telephone Encounter (Signed)
The patient is requesting medication for depression. Your note from 5.20.19 states  I suspect this is most likely related to mild depression and stress from caring for her husband and son.  We will check lab work.  Consider treatment with SSRI or therapy for lab work is unremarkable.

## 2018-04-20 ENCOUNTER — Other Ambulatory Visit: Payer: Self-pay | Admitting: Family Medicine

## 2018-04-20 ENCOUNTER — Other Ambulatory Visit (INDEPENDENT_AMBULATORY_CARE_PROVIDER_SITE_OTHER): Payer: PPO

## 2018-04-20 ENCOUNTER — Ambulatory Visit
Admission: RE | Admit: 2018-04-20 | Discharge: 2018-04-20 | Disposition: A | Discharge status: Home / Self Care | Payer: PPO | Source: Ambulatory Visit | Attending: Family Medicine | Admitting: Family Medicine

## 2018-04-20 ENCOUNTER — Other Ambulatory Visit: Payer: PPO

## 2018-04-20 DIAGNOSIS — E876 Hypokalemia: Secondary | ICD-10-CM

## 2018-04-20 DIAGNOSIS — R222 Localized swelling, mass and lump, trunk: Secondary | ICD-10-CM | POA: Insufficient documentation

## 2018-04-20 DIAGNOSIS — N6489 Other specified disorders of breast: Secondary | ICD-10-CM | POA: Diagnosis not present

## 2018-04-20 LAB — COMPREHENSIVE METABOLIC PANEL
ALBUMIN: 4.6 g/dL (ref 3.5–5.2)
ALK PHOS: 75 U/L (ref 39–117)
ALT: 21 U/L (ref 0–35)
AST: 24 U/L (ref 0–37)
BUN: 17 mg/dL (ref 6–23)
CALCIUM: 9.9 mg/dL (ref 8.4–10.5)
CO2: 27 meq/L (ref 19–32)
CREATININE: 0.87 mg/dL (ref 0.40–1.20)
Chloride: 100 mEq/L (ref 96–112)
GFR: 82.01 mL/min (ref >60.00)
Glucose, Bld: 101 mg/dL — ABNORMAL HIGH (ref 70–99)
Potassium: 4 mEq/L (ref 3.5–5.1)
Sodium: 135 mEq/L (ref 135–145)
Total Bilirubin: 0.4 mg/dL (ref 0.2–1.2)
Total Protein: 8.3 g/dL (ref 6.0–8.3)

## 2018-04-20 NOTE — Telephone Encounter (Signed)
Patient notified and is scheduled for follow up

## 2018-05-08 DIAGNOSIS — E669 Obesity, unspecified: Secondary | ICD-10-CM | POA: Diagnosis not present

## 2018-05-08 DIAGNOSIS — R000 Tachycardia, unspecified: Secondary | ICD-10-CM | POA: Diagnosis not present

## 2018-05-08 DIAGNOSIS — E876 Hypokalemia: Secondary | ICD-10-CM | POA: Diagnosis not present

## 2018-05-08 DIAGNOSIS — M1990 Unspecified osteoarthritis, unspecified site: Secondary | ICD-10-CM | POA: Diagnosis not present

## 2018-05-08 DIAGNOSIS — I10 Essential (primary) hypertension: Secondary | ICD-10-CM | POA: Diagnosis not present

## 2018-05-08 DIAGNOSIS — E785 Hyperlipidemia, unspecified: Secondary | ICD-10-CM | POA: Diagnosis not present

## 2018-05-08 DIAGNOSIS — R600 Localized edema: Secondary | ICD-10-CM | POA: Diagnosis not present

## 2018-05-08 DIAGNOSIS — R002 Palpitations: Secondary | ICD-10-CM | POA: Diagnosis not present

## 2018-06-02 ENCOUNTER — Ambulatory Visit (INDEPENDENT_AMBULATORY_CARE_PROVIDER_SITE_OTHER): Payer: PPO | Admitting: Family

## 2018-06-02 ENCOUNTER — Encounter: Payer: Self-pay | Admitting: Family

## 2018-06-02 VITALS — BP 118/74 | HR 76 | Temp 98.8°F | Wt 206.8 lb

## 2018-06-02 DIAGNOSIS — F339 Major depressive disorder, recurrent, unspecified: Secondary | ICD-10-CM | POA: Diagnosis not present

## 2018-06-02 DIAGNOSIS — R590 Localized enlarged lymph nodes: Secondary | ICD-10-CM | POA: Insufficient documentation

## 2018-06-02 NOTE — Assessment & Plan Note (Signed)
Unchanged.  Discussed trialing Zoloft again however at a lower dose such as 25 mg.  She had been on 50 mg and felt nauseated.  Advised her to take this medication with food.  She will let us know how she is tolerating medication also if it helps her symptoms.

## 2018-06-02 NOTE — Assessment & Plan Note (Signed)
Patient is asymptomatic at this time.  On exam, did appreciate what appeared to be enlarged area of the sub-mandibular region.  Possibly a enlarged lymph node.  Pending soft tissue ultrasound head and neck.

## 2018-06-02 NOTE — Progress Notes (Signed)
Subjective:    Patient ID: Michelle Clark, female    DOB: [birth year], 72 y.o.   MRN: [id number]  CC: Michelle Clark is a 72 y.o. female who presents today for an acute visit.    HPI: CC: knot appreciated right side neck x one day.  noticed yesterday evening.   Improved this morning with no treatment.   No cough, sore throat, hoarseness, difficultly swallowing, night sweats, unintentional weight loss. No dysphagia.   H/o parotiditis  Depression- tried zoloft, felt nauseated. No SI/HI. Notes trouble sleeping, wakes up at 3am. 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   UTD mammogram, recent right breast US.   Former smoker- quit 20 years ago      HISTORY:  Past Medical History:  Diagnosis Date  . Cancer [facility]) 2001   Right breast, s/p chemotherapy, XRT and mastectomy, Dr. Jeb Levering  . Colon polyps   . Hyperlipidemia   . Hypertension   . Personal history of chemotherapy   . Personal history of radiation therapy    Past Surgical History:  Procedure Laterality Date  . ABDOMINAL HYSTERECTOMY  1998   cervix intact  . APPENDECTOMY    . BREAST SURGERY  2002   mastectomy  . CHOLECYSTECTOMY    . MASTECTOMY Right 2001   chemo, rad  . TUBAL LIGATION     Family History  Problem Relation Age of Onset  . Arthritis Mother   . Arthritis Sister   . Stroke Sister        paralyzed left side  . Breast cancer Sister 71  . Cancer Sister        breast  . Heart disease Brother   . Asthma Brother   . Breast cancer Other   . Breast cancer Cousin        maternal - 30's    Allergies: Fluoxetine Current Outpatient Medications on File Prior to Visit  Medication Sig Dispense Refill  . amLODipine (NORVASC) 5 MG tablet Take 1 tablet (5 mg total) by mouth daily. 90 tablet 3  . aspirin 81 MG tablet Take 81 mg by mouth daily.    . Cholecalciferol (VITAMIN D) 2000 units tablet Take 1 tablet by mouth daily.    . cyclobenzaprine (FLEXERIL) 5 MG tablet TAKE 1 TABLET BY MOUTH  EVERY NIGHT AT BEDTIME AS NEEDED FOR MUSCLE SPASMS 30 tablet 1  . hydrochlorothiazide (HYDRODIURIL) 12.5 MG tablet Take 1 tablet (12.5 mg total) by mouth daily. 90 tablet 1  . losartan (COZAAR) 100 MG tablet Take 1 tablet (100 mg total) by mouth daily. 90 tablet 3  . metoprolol succinate (TOPROL-XL) 50 MG 24 hr tablet TAKE 1 TABLET BY MOUTH ONCE DAILY IMMEDIATELY FOLLOWING A MEAL 30 tablet 1  . Multiple Vitamin (MULTIVITAMIN) tablet Take 1 tablet by mouth daily.    . naproxen sodium (ANAPROX) 220 MG tablet Take 220 mg by mouth 2 (two) times daily with a meal.    . sertraline (ZOLOFT) 50 MG tablet Take 1 tablet (50 mg total) by mouth daily. 30 tablet 3  . simvastatin (ZOCOR) 20 MG tablet Take 1 tablet (20 mg total) by mouth daily. 90 tablet 3   No current facility-administered medications on file prior to visit.     Social History   Tobacco Use  . Smoking status: Former Smoker    Last attempt to quit: 04/10/1997    Years since quitting: 21.1  . Smokeless tobacco: Never Used  Substance Use Topics  . Alcohol use: Yes  Alcohol/week: 0.6 oz    Types: 1 Glasses of wine per week  . Drug use: No    Review of Systems  Constitutional: Negative for chills and fever.  HENT: Negative for congestion, postnasal drip, rhinorrhea, sinus pressure, sore throat, trouble swallowing and voice change.   Respiratory: Negative for cough.   Cardiovascular: Negative for chest pain and palpitations.  Gastrointestinal: Negative for nausea and vomiting.  Hematological: Negative for adenopathy.      Objective:    There were no vitals taken for this visit.   Physical Exam  Constitutional: She appears well-developed and well-nourished.  HENT:  Head: Normocephalic and atraumatic.  Right Ear: Hearing, tympanic membrane, external ear and ear canal normal. No drainage, swelling or tenderness. No foreign bodies. Tympanic membrane is not erythematous and not bulging. No middle ear effusion. No decreased  hearing is noted.  Left Ear: Hearing, tympanic membrane, external ear and ear canal normal. No drainage, swelling or tenderness. No foreign bodies. Tympanic membrane is not erythematous and not bulging.  No middle ear effusion. No decreased hearing is noted.  Nose: Nose normal. No rhinorrhea. Right sinus exhibits no maxillary sinus tenderness and no frontal sinus tenderness. Left sinus exhibits no maxillary sinus tenderness and no frontal sinus tenderness.  Mouth/Throat: Uvula is midline, oropharynx is clear and moist and mucous membranes are normal. No oropharyngeal exudate, posterior oropharyngeal edema, posterior oropharyngeal erythema or tonsillar abscesses.  Eyes: Conjunctivae are normal.  Neck: No thyroid mass and no thyromegaly present.  Cardiovascular: Regular rhythm, normal heart sounds and normal pulses.  Pulmonary/Chest: Effort normal and breath sounds normal. She has no wheezes. She has no rhonchi. She has no rales.  Lymphadenopathy:       Head (right side): Submandibular adenopathy present. No submental, no tonsillar, no preauricular, no posterior auricular and no occipital adenopathy present.       Head (left side): Submandibular adenopathy present. No submental, no tonsillar, no preauricular, no posterior auricular and no occipital adenopathy present.    She has no cervical adenopathy.       Right axillary: No pectoral and no lateral adenopathy present.       Left axillary: No pectoral and no lateral adenopathy present. Submandibular area  symmetrically, enlarged bilaterally.   No fluctuance, erythema, increased warmth or discrete nodules appreciated.  Neurological: She is alert.  Skin: Skin is warm and dry.  Psychiatric: She has a normal mood and affect. Her speech is normal and behavior is normal. Thought content normal.  Vitals reviewed.      Assessment & Plan:   Problem List Items Addressed This Visit      Immune and Lymphatic   Enlarged lymph node in neck - Primary     Patient is asymptomatic at this time.  On exam, did appreciate what appeared to be enlarged area of the sub-mandibular region.  Possibly a enlarged lymph node.  Pending soft tissue ultrasound head and neck.      Relevant Orders   US Soft Tissue Head/Neck     Other   Depression, recurrent (HCC)    Unchanged.  Discussed trialing Zoloft again however at a lower dose such as 25 mg.  She had been on 50 mg and felt nauseated.  Advised her to take this medication with food.  She will let us know how she is tolerating medication also if it helps her symptoms.             I am having Michelle Clark maintain her  multivitamin, aspirin, naproxen sodium, Vitamin D, hydrochlorothiazide, cyclobenzaprine, amLODipine, losartan, simvastatin, sertraline, and metoprolol succinate.   No orders of the defined types were placed in this encounter.   Return precautions given.   Risks, benefits, and alternatives of the medications and treatment plan prescribed today were discussed, and patient expressed understanding.   Education regarding symptom management and diagnosis given to patient on AVS.  Continue to follow with Leone Haven, MD for routine health maintenance.   Michelle Clark and I agreed with plan.   Mable Paris, FNP

## 2018-06-02 NOTE — Patient Instructions (Signed)
Trial of zoloft 5mg . Let us know how how you are doing on this dose; likely we will need to increase to 50mg   Today we discussed referrals, orders. Ultrasound of neck   I have placed these orders in the system for you.  Please be sure to give [place] a call if you have not heard from our office regarding scheduling a test or regarding referral in a timely manner.  It is very important that you let me know as soon as possible.   Pleasure meeting you!

## 2018-06-06 ENCOUNTER — Encounter (INDEPENDENT_AMBULATORY_CARE_PROVIDER_SITE_OTHER): Payer: Self-pay

## 2018-06-12 ENCOUNTER — Ambulatory Visit
Admission: RE | Admit: 2018-06-12 | Discharge: 2018-06-12 | Disposition: A | Discharge status: Home / Self Care | Payer: PPO | Source: Ambulatory Visit | Attending: Family | Admitting: Family

## 2018-06-12 DIAGNOSIS — K118 Other diseases of salivary glands: Secondary | ICD-10-CM | POA: Diagnosis not present

## 2018-06-12 DIAGNOSIS — R590 Localized enlarged lymph nodes: Secondary | ICD-10-CM | POA: Insufficient documentation

## 2018-06-14 ENCOUNTER — Encounter: Payer: PPO | Admitting: Family

## 2018-06-19 ENCOUNTER — Ambulatory Visit (INDEPENDENT_AMBULATORY_CARE_PROVIDER_SITE_OTHER): Payer: PPO | Admitting: Family

## 2018-06-19 ENCOUNTER — Encounter: Payer: Self-pay | Admitting: Family

## 2018-06-19 VITALS — BP 122/68 | HR 71 | Temp 98.3°F | Resp 15 | Wt 203.5 lb

## 2018-06-19 DIAGNOSIS — R590 Localized enlarged lymph nodes: Secondary | ICD-10-CM

## 2018-06-19 DIAGNOSIS — F339 Major depressive disorder, recurrent, unspecified: Secondary | ICD-10-CM

## 2018-06-19 NOTE — Assessment & Plan Note (Signed)
Patient well-appearing today.  Reviewed ultrasound of neck in depth with patient.  Reassured as no pathologic appearance of lymph nodes.  Based on dimensions, lymph nodes appear symmetric.  On exam today, no appreciable lymph nodes. Discussed with patient that I would consult with oncologist, Dr. Rogue Bussing, via message for further advice.  As patient is asymptomatic, I want to  ensure medical necessity in regards to ordering further images including CT head neck, chest x-ray and  In an asymptomatic patient in which ultrasound looked benign.  Will await his advice.  Patient is aware of this.   Advised her to remain vigilant.

## 2018-06-19 NOTE — Progress Notes (Signed)
Subjective:    Patient ID: Michelle Clark, female    DOB: 05/12/1946, 72 y.o.   MRN: [id number]  CC: Michelle Clark is a 72 y.o. female who presents today for follow up.   HPI: Depression- taking half tablet of zoloft, no longer feelin nauseated however cannot tell a difference on medication.   She is also to follow-up on ultrasound of neck.  She appreciated a right side neck mass approximately 3 weeks ago, states feels unchanged.   She denies any cough cold congestion symptoms today.    No night sweats. No trouble swallowing or hoarseness. Describes occasional 'hot flashes' from neck up with perspiration ,notices that chocalate, caffeine makes these worse. She does not drench sheets.       H/o breast cancer. Mammogram left was benign, utd.  UTD colonscopy   HISTORY:  Past Medical History:  Diagnosis Date  . Cancer [facility]) 2001   Right breast, s/p chemotherapy, XRT and mastectomy, Dr. Jeb Levering  . Colon polyps   . Hyperlipidemia   . Hypertension   . Personal history of chemotherapy   . Personal history of radiation therapy    Past Surgical History:  Procedure Laterality Date  . ABDOMINAL HYSTERECTOMY  1998   cervix intact  . APPENDECTOMY    . BREAST SURGERY  2002   mastectomy  . CHOLECYSTECTOMY    . MASTECTOMY Right 2001   chemo, rad  . TUBAL LIGATION     Family History  Problem Relation Age of Onset  . Arthritis Mother   . Arthritis Sister   . Stroke Sister        paralyzed left side  . Breast cancer Sister 77  . Cancer Sister        breast  . Heart disease Brother   . Asthma Brother   . Breast cancer Other   . Breast cancer Cousin        maternal - 30's    Allergies: Fluoxetine Current Outpatient Medications on File Prior to Visit  Medication Sig Dispense Refill  . amLODipine (NORVASC) 5 MG tablet Take 1 tablet (5 mg total) by mouth daily. 90 tablet 3  . aspirin 81 MG tablet Take 81 mg by mouth daily.    . Cholecalciferol (VITAMIN D) 2000 units  tablet Take 1 tablet by mouth daily.    . cyclobenzaprine (FLEXERIL) 5 MG tablet TAKE 1 TABLET BY MOUTH EVERY NIGHT AT BEDTIME AS NEEDED FOR MUSCLE SPASMS 30 tablet 1  . hydrochlorothiazide (HYDRODIURIL) 12.5 MG tablet Take 1 tablet (12.5 mg total) by mouth daily. 90 tablet 1  . losartan (COZAAR) 100 MG tablet Take 1 tablet (100 mg total) by mouth daily. 90 tablet 3  . metoprolol succinate (TOPROL-XL) 50 MG 24 hr tablet TAKE 1 TABLET BY MOUTH ONCE DAILY IMMEDIATELY FOLLOWING A MEAL 30 tablet 1  . Multiple Vitamin (MULTIVITAMIN) tablet Take 1 tablet by mouth daily.    . naproxen sodium (ANAPROX) 220 MG tablet Take 220 mg by mouth 2 (two) times daily with a meal.    . sertraline (ZOLOFT) 50 MG tablet Take 1 tablet (50 mg total) by mouth daily. 30 tablet 3  . simvastatin (ZOCOR) 20 MG tablet Take 1 tablet (20 mg total) by mouth daily. 90 tablet 3   No current facility-administered medications on file prior to visit.     Social History   Tobacco Use  . Smoking status: Former Smoker    Last attempt to quit: 04/10/1997  Years since quitting: 21.2  . Smokeless tobacco: Never Used  Substance Use Topics  . Alcohol use: Yes    Alcohol/week: 0.6 oz    Types: 1 Glasses of wine per week  . Drug use: No    Review of Systems  Constitutional: Negative for chills, fatigue and fever.  HENT: Negative for congestion, sinus pain, sore throat, trouble swallowing and voice change.   Respiratory: Negative for cough.   Cardiovascular: Negative for chest pain and palpitations.  Gastrointestinal: Negative for nausea and vomiting.  Hematological: Positive for adenopathy (right side of neck).      Objective:    BP 122/68 (BP Location: Left Arm, Patient Position: Sitting, Cuff Size: Large)   Pulse 71   Temp 98.3 F (36.8 C) (Oral)   Resp 15   Wt 203 lb 8 oz (92.3 kg)   SpO2 98%   BMI 32.85 kg/m  BP Readings from Last 3 Encounters:  06/19/18 122/68  06/02/18 118/74  04/10/18 126/70   Wt  Readings from Last 3 Encounters:  06/19/18 203 lb 8 oz (92.3 kg)  06/02/18 206 lb 12 oz (93.8 kg)  04/10/18 205 lb 12.8 oz (93.4 kg)    Physical Exam  Constitutional: She appears well-developed and well-nourished.  Eyes: Conjunctivae are normal.  Cardiovascular: Normal rate, regular rhythm, normal heart sounds and normal pulses.  Pulmonary/Chest: Effort normal and breath sounds normal. She has no wheezes. She has no rhonchi. She has no rales.  Lymphadenopathy:       Head (right side): No submental, no submandibular, no tonsillar, no preauricular, no posterior auricular and no occipital adenopathy present.       Head (left side): No submental, no submandibular, no tonsillar, no preauricular, no posterior auricular and no occipital adenopathy present.    She has no axillary adenopathy.       Right: No inguinal adenopathy present.       Left: No inguinal adenopathy present.  No appreciable lymph nodes. In particular, no obvious size in submandibular region. Non tender.   Neurological: She is alert.  Skin: Skin is warm and dry.  Psychiatric: She has a normal mood and affect. Her speech is normal and behavior is normal. Thought content normal.  Vitals reviewed.      Assessment & Plan:   Problem List Items Addressed This Visit      Immune and Lymphatic   Enlarged lymph node in neck    Patient well-appearing today.  Reviewed ultrasound of neck in depth with patient.  Reassured as no pathologic appearance of lymph nodes.  Based on dimensions, lymph nodes appear symmetric.  On exam today, no appreciable lymph nodes. Discussed with patient that I would consult with oncologist, Dr. Rogue Bussing, via message for further advice.  As patient is asymptomatic, I want to  ensure medical necessity in regards to ordering further images including CT head neck, chest x-ray and  In an asymptomatic patient in which ultrasound looked benign.  Will await his advice.  Patient is aware of this.   Advised her to  remain vigilant.        Other   Depression, recurrent (HCC)    Unchanged. Advised increase of zoloft to 50mg , Close follow up.           I am having Dory B. Dehnert maintain her multivitamin, aspirin, naproxen sodium, Vitamin D, hydrochlorothiazide, cyclobenzaprine, amLODipine, losartan, simvastatin, sertraline, and metoprolol succinate.   No orders of the defined types were placed in this encounter.  Return precautions given.   Risks, benefits, and alternatives of the medications and treatment plan prescribed today were discussed, and patient expressed understanding.   Education regarding symptom management and diagnosis given to patient on AVS.  Continue to follow with Burnard Hawthorne, FNP for routine health maintenance.   Michelle Clark and I agreed with plan.   Mable Paris, FNP

## 2018-06-19 NOTE — Patient Instructions (Signed)
Trial increase of zoloft to 50mg  at bedtime  I have messaged Dr Rogue Bussing at the [facility] for his opinion regarding additional imaging ( CT head and neck, Chest Xray) and if necessary. Please message me if you havent heard from me in a few days  Look forward to getting to know you.

## 2018-06-19 NOTE — Assessment & Plan Note (Addendum)
Unchanged. Advised increase of zoloft to 50mg , Close follow up.

## 2018-06-23 ENCOUNTER — Encounter: Payer: Self-pay | Admitting: Family

## 2018-06-26 ENCOUNTER — Other Ambulatory Visit: Payer: Self-pay | Admitting: Family Medicine

## 2018-06-26 DIAGNOSIS — I10 Essential (primary) hypertension: Secondary | ICD-10-CM

## 2018-07-14 ENCOUNTER — Ambulatory Visit: Payer: PPO | Admitting: Family Medicine

## 2018-07-26 ENCOUNTER — Other Ambulatory Visit: Payer: Self-pay | Admitting: Family Medicine

## 2018-07-27 NOTE — Telephone Encounter (Signed)
Pt came in to check on the status of the refill on toprol she only has 4 pills left

## 2018-07-28 ENCOUNTER — Telehealth: Payer: Self-pay

## 2018-07-28 NOTE — Telephone Encounter (Signed)
Copied from [place] [id number]. Topic: Medicare AWV >> Jul 28, 2018 10:52 AM Burchel, Abbi R wrote:   Pt requesting AWV.  Please call to schedule.

## 2018-08-01 NOTE — Telephone Encounter (Signed)
Thank you! Called pt and left msg.

## 2018-08-02 NOTE — Telephone Encounter (Signed)
Pt returned your call.  

## 2018-08-29 ENCOUNTER — Other Ambulatory Visit: Payer: Self-pay | Admitting: Family Medicine

## 2018-09-01 ENCOUNTER — Encounter

## 2018-09-01 ENCOUNTER — Ambulatory Visit: Payer: PPO | Admitting: Family Medicine

## 2018-09-21 DIAGNOSIS — H2513 Age-related nuclear cataract, bilateral: Secondary | ICD-10-CM | POA: Diagnosis not present

## 2018-09-25 ENCOUNTER — Encounter: Payer: Self-pay | Admitting: Family

## 2018-09-25 ENCOUNTER — Ambulatory Visit (INDEPENDENT_AMBULATORY_CARE_PROVIDER_SITE_OTHER): Payer: PPO | Admitting: Family

## 2018-09-25 ENCOUNTER — Ambulatory Visit (INDEPENDENT_AMBULATORY_CARE_PROVIDER_SITE_OTHER): Payer: PPO

## 2018-09-25 VITALS — BP 132/78 | HR 75 | Temp 98.6°F | Resp 15 | Ht 66.0 in | Wt 204.5 lb

## 2018-09-25 VITALS — BP 122/68 | HR 71 | Temp 98.3°F | Resp 15 | Ht 66.0 in | Wt 203.5 lb

## 2018-09-25 DIAGNOSIS — Z0000 Encounter for general adult medical examination without abnormal findings: Secondary | ICD-10-CM

## 2018-09-25 DIAGNOSIS — E782 Mixed hyperlipidemia: Secondary | ICD-10-CM | POA: Diagnosis not present

## 2018-09-25 DIAGNOSIS — I10 Essential (primary) hypertension: Secondary | ICD-10-CM | POA: Diagnosis not present

## 2018-09-25 DIAGNOSIS — Z87898 Personal history of other specified conditions: Secondary | ICD-10-CM

## 2018-09-25 DIAGNOSIS — F339 Major depressive disorder, recurrent, unspecified: Secondary | ICD-10-CM | POA: Diagnosis not present

## 2018-09-25 DIAGNOSIS — Z23 Encounter for immunization: Secondary | ICD-10-CM

## 2018-09-25 DIAGNOSIS — R590 Localized enlarged lymph nodes: Secondary | ICD-10-CM

## 2018-09-25 LAB — CBC WITH DIFFERENTIAL/PLATELET
BASOS PCT: 1.2 % (ref 0.0–3.0)
Basophils Absolute: 0.1 10*3/uL (ref 0.0–0.1)
Eosinophils Absolute: 0.2 10*3/uL (ref 0.0–0.7)
Eosinophils Relative: 2.3 % (ref 0.0–5.0)
HCT: 41.2 % (ref 36.0–46.0)
Hemoglobin: 13.7 g/dL (ref 12.0–15.0)
LYMPHS ABS: 3.4 10*3/uL (ref 0.7–4.0)
Lymphocytes Relative: 41.1 % (ref 12.0–46.0)
MCHC: 33.3 g/dL (ref 30.0–36.0)
MCV: 86 fl (ref 78.0–100.0)
Monocytes Absolute: 0.6 10*3/uL (ref 0.1–1.0)
Monocytes Relative: 7.6 % (ref 3.0–12.0)
NEUTROS ABS: 4 10*3/uL (ref 1.4–7.7)
NEUTROS PCT: 47.8 % (ref 43.0–77.0)
PLATELETS: 244 10*3/uL (ref 150.0–400.0)
RBC: 4.79 Mil/uL (ref 3.87–5.11)
RDW: 14.2 % (ref 11.5–15.5)
WBC: 8.4 10*3/uL (ref 4.0–10.5)

## 2018-09-25 LAB — COMPREHENSIVE METABOLIC PANEL
ALT: 22 U/L (ref 0–35)
AST: 22 U/L (ref 0–37)
Albumin: 4.5 g/dL (ref 3.5–5.2)
Alkaline Phosphatase: 87 U/L (ref 39–117)
BUN: 17 mg/dL (ref 6–23)
CHLORIDE: 101 meq/L (ref 96–112)
CO2: 30 meq/L (ref 19–32)
CREATININE: 0.81 mg/dL (ref 0.40–1.20)
Calcium: 9.9 mg/dL (ref 8.4–10.5)
GFR: 88.96 mL/min (ref >60.00)
Glucose, Bld: 97 mg/dL (ref 70–99)
Potassium: 3.7 mEq/L (ref 3.5–5.1)
Sodium: 138 mEq/L (ref 135–145)
Total Bilirubin: 0.5 mg/dL (ref 0.2–1.2)
Total Protein: 8.1 g/dL (ref 6.0–8.3)

## 2018-09-25 LAB — VITAMIN D 25 HYDROXY (VIT D DEFICIENCY, FRACTURES): VITD: 51.3 ng/mL (ref 30.00–100.00)

## 2018-09-25 LAB — LIPID PANEL
CHOLESTEROL: 147 mg/dL (ref 0–200)
HDL: 55.2 mg/dL (ref >39.00)
LDL CALC: 76 mg/dL (ref 0–99)
NonHDL: 91.49
TRIGLYCERIDES: 78 mg/dL (ref 0.0–149.0)
Total CHOL/HDL Ratio: 3
VLDL: 15.6 mg/dL (ref 0.0–40.0)

## 2018-09-25 LAB — HEMOGLOBIN A1C: Hgb A1c MFr Bld: 5.9 % (ref 4.6–6.5)

## 2018-09-25 LAB — TSH: TSH: 3.75 u[IU]/mL (ref 0.35–4.50)

## 2018-09-25 MED ORDER — HYDROCHLOROTHIAZIDE 12.5 MG PO CAPS: 12.5000 mg | ORAL_CAPSULE | Freq: Every day | ORAL | 1 refills | Status: DC

## 2018-09-25 MED ORDER — SERTRALINE HCL 50 MG PO TABS: 50.0000 mg | ORAL_TABLET | Freq: Every day | ORAL | 3 refills | Status: DC

## 2018-09-25 MED ORDER — BUPROPION HCL ER (XL) 150 MG PO TB24: ORAL_TABLET | ORAL | 3 refills | Status: DC

## 2018-09-25 NOTE — Assessment & Plan Note (Signed)
At goal, will continue to monitor.

## 2018-09-25 NOTE — Progress Notes (Signed)
Subjective:    Patient ID: Michelle Clark, female    DOB: 02/19/1946, 72 y.o.   MRN: [id number]  CC: Michelle Clark is a 72 y.o. female who presents today for physical exam.    HPI: Depression- increased zoloft to 50mg   at last OV. Feels better. States some trouble going back to sleep. Overeating.  Decreased motivation with house chores. Stopped part time job recently and happy about that. Felt like work as cleaning , would tire easily. No sob, cp.   Doesn't snore.   No h/o seizure. Occasionally has glass of red wine. No more than two.   HTN- controlled. Compliant with medications. 5 -10 years ago had stress test with [facility]. No longer follows with him.   HLD - compliant with medication.   Enlarged lymph nodes-resolved. Declines CT head and neck  Reviewed problem list, allergies, medications today.   Colorectal Cancer Screening: UTD , repeat 5 years Breast Cancer Screening: Mammogram due; Hasnt felt Right breast mass since ultrasound.  Cervical Cancer Screening: h/o hysterectomy for noncancerous reasons. No vaginal bleeding, pelvic pain.  Bone Health screening/DEXA for 65+: due  Lung Cancer Screening: quit  1998       Tetanus - utd        Labs: Screening labs today. Exercise: Gets regular exercise.  Walks 3x per week, approx 3 laps around gym.  Alcohol use: occasional Smoking/tobacco use: former smoker.    HISTORY:  Past Medical History:  Diagnosis Date  . Cancer [facility]) 2001   Right breast, s/p chemotherapy, XRT and mastectomy, Dr. Jeb Levering  . Colon polyps   . Hyperlipidemia   . Hypertension   . Personal history of chemotherapy   . Personal history of radiation therapy     Past Surgical History:  Procedure Laterality Date  . ABDOMINAL HYSTERECTOMY  1998   cervix intact  . APPENDECTOMY    . BREAST SURGERY  2002   mastectomy  . CHOLECYSTECTOMY    . MASTECTOMY Right 2001   chemo, rad  . TUBAL LIGATION     Family History  Problem Relation Age of Onset  . Arthritis Mother   . Arthritis Sister   . Stroke Sister        paralyzed left side  . Breast cancer Sister 12  . Cancer Sister        breast  . Heart disease Brother   . Asthma Brother   . Breast cancer Other   . Breast cancer Cousin        maternal - 24's      ALLERGIES: Fluoxetine  Current Outpatient Medications on File Prior to Visit  Medication Sig Dispense Refill  . amLODipine (NORVASC) 5 MG tablet Take 1 tablet (5 mg total) by mouth daily. 90 tablet 3  . aspirin 81 MG tablet Take 81 mg by mouth daily.    . Cholecalciferol (VITAMIN D) 2000 units tablet Take 1 tablet by mouth daily.    Marland Kitchen losartan (COZAAR) 100 MG tablet Take 1 tablet (100 mg total) by mouth daily. 90 tablet 3  . metoprolol succinate (TOPROL-XL) 50 MG 24 hr tablet TAKE 1 TABLET BY MOUTH ONCE A DAY IMMEDIATELY FOLLOWING A MEAL 30 tablet 1  . Multiple Vitamin (MULTIVITAMIN) tablet Take 1 tablet by mouth daily.    . naproxen sodium (ANAPROX) 220 MG tablet Take 220 mg by mouth 2 (two) times daily with a meal.    . simvastatin (ZOCOR) 20 MG tablet Take 1 tablet (20 mg total) by mouth daily. 90 tablet 3   No current facility-administered medications  on file prior to visit.     Social History   Tobacco Use  . Smoking status: Former Smoker    Last attempt to quit: 04/10/1997    Years since quitting: 21.4  . Smokeless tobacco: Never Used  Substance Use Topics  . Alcohol use: Yes    Alcohol/week: 1.0 standard drinks    Types: 1 Glasses of wine per week  . Drug use: No    Review of Systems  Constitutional: Positive for fatigue. Negative for chills, fever and unexpected weight change.  HENT: Negative for congestion.   Respiratory: Negative for cough.   Cardiovascular: Negative for chest pain, palpitations and leg swelling.  Gastrointestinal: Negative for nausea and vomiting.  Musculoskeletal: Negative for arthralgias and myalgias.  Skin: Negative for rash.  Neurological: Negative for headaches.  Hematological: Negative for adenopathy.  Psychiatric/Behavioral: Positive for sleep disturbance. Negative for confusion and suicidal ideas. The patient is not nervous/anxious.       Objective:    BP 132/78 (BP Location: Left Arm, Patient Position: Sitting, Cuff Size: Large)   Pulse 75   Temp 98.6 F (37 C) (Oral)   Resp 15   Ht 5\' 6"  (1.676 m)   Wt 204 lb 8 oz (92.8 kg)   SpO2 97%   BMI 33.01 kg/m   BP Readings from Last 3 Encounters:  09/25/18 132/78  09/25/18 122/68  06/19/18 122/68   Wt Readings from Last 3 Encounters:  09/25/18 204 lb 8 oz (92.8 kg)  09/25/18 203 lb 8 oz (92.3 kg)  06/19/18 203 lb 8 oz (92.3 kg)    Physical Exam  Constitutional: She appears well-developed and well-nourished.  Eyes: Conjunctivae are normal.  Neck: No thyroid mass and no thyromegaly present.  Cardiovascular: Normal rate, regular rhythm, normal heart sounds and normal pulses.  Pulmonary/Chest: Effort normal and breath sounds normal. She has no wheezes. She has no rhonchi. She has no rales. Right breast exhibits no tenderness. Left breast exhibits no inverted nipple, no mass, no nipple discharge, no skin change and no tenderness.  CBE  performed.  Right breast mastectomy. No masses appreciated right axilla.   Lymphadenopathy:       Head (right side): No submental, no submandibular, no tonsillar, no preauricular, no posterior auricular and no occipital adenopathy present.       Head (left side): No submental, no submandibular, no tonsillar, no preauricular, no posterior auricular and no occipital adenopathy present.    She has no cervical adenopathy.       Right cervical: No superficial cervical, no deep cervical and no posterior cervical adenopathy present.      Left cervical: No superficial cervical, no deep cervical and no posterior cervical adenopathy present.    She has no axillary adenopathy.  Neurological: She is alert.  Skin: Skin is warm and dry.  Psychiatric: She has a normal mood and affect. Her speech is normal and behavior is normal. Thought content normal.  Vitals reviewed.      Assessment & Plan:   Problem List Items Addressed This Visit      Cardiovascular and Mediastinum   Essential hypertension, benign    At goal, will continue to monitor.      Relevant Medications   hydrochlorothiazide (MICROZIDE) 12.5 MG capsule     Immune and Lymphatic   Enlarged lymph node in neck    Resolved.         Other   Hyperlipidemia    Compliant medication.  Will continue.      Relevant Medications   hydrochlorothiazide (MICROZIDE) 12.5 MG capsule   Depression, recurrent ([place])    Pleased that she has had improvement.  We will go ahead and adjunct with Wellbutrin to help with overeating, fatigue.  Close follow-up in 6 weeks.      Relevant Medications   sertraline (ZOLOFT) 50 MG tablet   buPROPion (WELLBUTRIN XL) 150 MG 24 hr tablet   Routine health maintenance    Clinical breast exam performed.  Deferred pelvic exam in the absence of pelvic complaints, history of hysterectomy.  Patient will call and schedule her mammogram, bone density test.  She politely declines CT head neck as she is unable to  appreciate enlarged lymph node.  Normal HEENT exam as well today.  I think this  is reasonable.  I did encourage her to stay very vigilant.  She also states she is unable to feel any right axillary mass at this time, normal breast exam so I think this is appropriate as well.  Again, advised her to stay vigilant.       Other Visit Diagnoses    Routine physical examination    -  Primary   Relevant Orders   DG Bone Density   CBC with Differential/Platelet   Comprehensive metabolic panel   Hemoglobin A1c   Lipid panel   TSH   VITAMIN D 25 Hydroxy (Vit-D Deficiency, Fractures)   MM 3D SCREEN BREAST BILATERAL       I have discontinued Deneise Lever B. Virgin's hydrochlorothiazide and cyclobenzaprine. I have also changed her sertraline and hydrochlorothiazide. Additionally, I am having her start on buPROPion. Lastly, I am having her maintain her multivitamin, aspirin, naproxen sodium, Vitamin D, amLODipine, losartan, simvastatin, and metoprolol succinate.   Meds ordered this encounter  Medications  . sertraline (ZOLOFT) 50 MG tablet    Sig: Take 1 tablet (50 mg total) by mouth daily.    Dispense:  90 tablet    Refill:  3  . hydrochlorothiazide (MICROZIDE) 12.5 MG capsule    Sig: Take 1 capsule (12.5 mg total) by mouth daily.    Dispense:  90 capsule    Refill:  1  . buPROPion (WELLBUTRIN XL) 150 MG 24 hr tablet    Sig: Start 150 mg ER PO qam, increase after 3 days to 300 mg qam.    Dispense:  60 tablet    Refill:  3    Order Specific Question:   Supervising Provider    Answer:   Crecencio Mc [[id number]]    Return precautions given.   Risks, benefits, and alternatives of the medications and treatment plan prescribed today were discussed, and patient expressed understanding.   Education regarding symptom management and diagnosis given to patient on AVS.   Continue to follow with Burnard Hawthorne, FNP for routine health maintenance.   Michelle Clark and I agreed with plan.   Mable Paris, FNP

## 2018-09-25 NOTE — Patient Instructions (Signed)
Trial of Wellbutrin.  As discussed, start by taking 150 mg in the morning with food for the next 3 to 7 days, then  may increase to 2 tablets for a total of 300 mg in the mornings. Close follow-up in 6 weeks. Please call [facility] and schedule your mammogram, bone density as discussed.   Health Maintenance for Postmenopausal Women Menopause is a normal process in which your reproductive ability comes to an end. This process happens gradually over a span of months to years, usually between the ages of 71 and 43. Menopause is complete when you have missed 12 consecutive menstrual periods. It is important to talk with your health care provider about some of the most common conditions that affect postmenopausal women, such as heart disease, cancer, and bone loss (osteoporosis). Adopting a healthy lifestyle and getting preventive care can help to promote your health and wellness. Those actions can also lower your chances of developing some of these common conditions. What should I know about menopause? During menopause, you may experience a number of symptoms, such as:  Moderate-to-severe hot flashes.  Night sweats.  Decrease in sex drive.  Mood swings.  Headaches.  Tiredness.  Irritability.  Memory problems.  Insomnia.  Choosing to treat or not to treat menopausal changes is an individual decision that you make with your health care provider. What should I know about hormone replacement therapy and supplements? Hormone therapy products are effective for treating symptoms that are associated with menopause, such as hot flashes and night sweats. Hormone replacement carries certain risks, especially as you become older. If you are thinking about using estrogen or estrogen with progestin treatments, discuss the benefits and risks with your health care provider. What should I know about heart disease and stroke? Heart disease, heart attack, and stroke become more likely as you age. This may be  due, in part, to the hormonal changes that your body experiences during menopause. These can affect how your body processes dietary fats, triglycerides, and cholesterol. Heart attack and stroke are both medical emergencies. There are many things that you can do to help prevent heart disease and stroke:  Have your blood pressure checked at least every 1-2 years. High blood pressure causes heart disease and increases the risk of stroke.  If you are 21-14 years old, ask your health care provider if you should take aspirin to prevent a heart attack or a stroke.  Do not use any tobacco products, including cigarettes, chewing tobacco, or electronic cigarettes. If you need help quitting, ask your health care provider.  It is important to eat a healthy diet and maintain a healthy weight. ? Be sure to include plenty of vegetables, fruits, low-fat dairy products, and lean protein. ? Avoid eating foods that are high in solid fats, added sugars, or salt (sodium).  Get regular exercise. This is one of the most important things that you can do for your health. ? Try to exercise for at least 150 minutes each week. The type of exercise that you do should increase your heart rate and make you sweat. This is known as moderate-intensity exercise. ? Try to do strengthening exercises at least twice each week. Do these in addition to the moderate-intensity exercise.  Know your numbers.Ask your health care provider to check your cholesterol and your blood glucose. Continue to have your blood tested as directed by your health care provider.  What should I know about cancer screening? There are several types of cancer. Take the following steps  to reduce your risk and to catch any cancer development as early as possible. Breast Cancer  Practice breast self-awareness. ? This means understanding how your breasts normally appear and feel. ? It also means doing regular breast self-exams. Let your health care provider  know about any changes, no matter how small.  If you are 67 or older, have a clinician do a breast exam (clinical breast exam or CBE) every year. Depending on your age, family history, and medical history, it may be recommended that you also have a yearly breast X-ray (mammogram).  If you have a family history of breast cancer, talk with your health care provider about genetic screening.  If you are at high risk for breast cancer, talk with your health care provider about having an MRI and a mammogram every year.  Breast cancer (BRCA) gene test is recommended for women who have family members with BRCA-related cancers. Results of the assessment will determine the need for genetic counseling and BRCA1 and for BRCA2 testing. BRCA-related cancers include these types: ? Breast. This occurs in males or females. ? Ovarian. ? Tubal. This may also be called fallopian tube cancer. ? Cancer of the abdominal or pelvic lining (peritoneal cancer). ? Prostate. ? Pancreatic.  Cervical, Uterine, and Ovarian Cancer Your health care provider may recommend that you be screened regularly for cancer of the pelvic organs. These include your ovaries, uterus, and vagina. This screening involves a pelvic exam, which includes checking for microscopic changes to the surface of your cervix (Pap test).  For women ages 21-65, health care providers may recommend a pelvic exam and a Pap test every three years. For women ages 83-65, they may recommend the Pap test and pelvic exam, combined with testing for human papilloma virus (HPV), every five years. Some types of HPV increase your risk of cervical cancer. Testing for HPV may also be done on women of any age who have unclear Pap test results.  Other health care providers may not recommend any screening for nonpregnant women who are considered low risk for pelvic cancer and have no symptoms. Ask your health care provider if a screening pelvic exam is right for you.  If you  have had past treatment for cervical cancer or a condition that could lead to cancer, you need Pap tests and screening for cancer for at least 20 years after your treatment. If Pap tests have been discontinued for you, your risk factors (such as having a new sexual partner) need to be reassessed to determine if you should start having screenings again. Some women have medical problems that increase the chance of getting cervical cancer. In these cases, your health care provider may recommend that you have screening and Pap tests more often.  If you have a family history of uterine cancer or ovarian cancer, talk with your health care provider about genetic screening.  If you have vaginal bleeding after reaching menopause, tell your health care provider.  There are currently no reliable tests available to screen for ovarian cancer.  Lung Cancer Lung cancer screening is recommended for adults 70-72 years old who are at high risk for lung cancer because of a history of smoking. A yearly low-dose CT scan of the lungs is recommended if you:  Currently smoke.  Have a history of at least 30 pack-years of smoking and you currently smoke or have quit within the past 15 years. A pack-year is smoking an average of one pack of cigarettes per day for one  year.  Yearly screening should:  Continue until it has been 15 years since you quit.  Stop if you develop a health problem that would prevent you from having lung cancer treatment.  Colorectal Cancer  This type of cancer can be detected and can often be prevented.  Routine colorectal cancer screening usually begins at age 4 and continues through age 76.  If you have risk factors for colon cancer, your health care provider may recommend that you be screened at an earlier age.  If you have a family history of colorectal cancer, talk with your health care provider about genetic screening.  Your health care provider may also recommend using home test  kits to check for hidden blood in your stool.  A small camera at the end of a tube can be used to examine your colon directly (sigmoidoscopy or colonoscopy). This is done to check for the earliest forms of colorectal cancer.  Direct examination of the colon should be repeated every 5-10 years until age 85. However, if early forms of precancerous polyps or small growths are found or if you have a family history or genetic risk for colorectal cancer, you may need to be screened more often.  Skin Cancer  Check your skin from head to toe regularly.  Monitor any moles. Be sure to tell your health care provider: ? About any new moles or changes in moles, especially if there is a change in a mole's shape or color. ? If you have a mole that is larger than the size of a pencil eraser.  If any of your family members has a history of skin cancer, especially at a young age, talk with your health care provider about genetic screening.  Always use sunscreen. Apply sunscreen liberally and repeatedly throughout the day.  Whenever you are outside, protect yourself by wearing long sleeves, pants, a wide-brimmed hat, and sunglasses.  What should I know about osteoporosis? Osteoporosis is a condition in which bone destruction happens more quickly than new bone creation. After menopause, you may be at an increased risk for osteoporosis. To help prevent osteoporosis or the bone fractures that can happen because of osteoporosis, the following is recommended:  If you are 64-23 years old, get at least 1,000 mg of calcium and at least 600 mg of vitamin D per day.  If you are older than age 63 but younger than age 56, get at least 1,200 mg of calcium and at least 600 mg of vitamin D per day.  If you are older than age 30, get at least 1,200 mg of calcium and at least 800 mg of vitamin D per day.  Smoking and excessive alcohol intake increase the risk of osteoporosis. Eat foods that are rich in calcium and vitamin  D, and do weight-bearing exercises several times each week as directed by your health care provider. What should I know about how menopause affects my mental health? Depression may occur at any age, but it is more common as you become older. Common symptoms of depression include:  Low or sad mood.  Changes in sleep patterns.  Changes in appetite or eating patterns.  Feeling an overall lack of motivation or enjoyment of activities that you previously enjoyed.  Frequent crying spells.  Talk with your health care provider if you think that you are experiencing depression. What should I know about immunizations? It is important that you get and maintain your immunizations. These include:  Tetanus, diphtheria, and pertussis (Tdap) booster vaccine.  Influenza every year before the flu season begins.  Pneumonia vaccine.  Shingles vaccine.  Your health care provider may also recommend other immunizations. This information is not intended to replace advice given to you by your health care provider. Make sure you discuss any questions you have with your health care provider. Document Released: 12/31/2005 Document Revised: 05/28/2016 Document Reviewed: 08/12/2015 Elsevier Interactive Patient Education  2018 [employer].

## 2018-09-25 NOTE — Assessment & Plan Note (Signed)
Compliant medication.  Will continue.

## 2018-09-25 NOTE — Patient Instructions (Addendum)
  Michelle Clark , Thank you for taking time to come for your Medicare Wellness Visit. I appreciate your ongoing commitment to your health goals. Please review the following plan we discussed and let me know if I can assist you in the future.   These are the goals we discussed: Goals      Patient Stated   . Low carb diet (pt-stated)     Monitor diet       This is a list of the screening recommended for you and due dates:  Health Maintenance  Topic Date Due  . Flu Shot  06/22/2018  . Mammogram  11/19/2019  . Colon Cancer Screening  03/11/2020  . Tetanus Vaccine  09/15/2026  . DEXA scan (bone density measurement)  Completed  .  Hepatitis C: One time screening is recommended by Center for Disease Control  (CDC) for  adults born from [birth year] through 1966.   Completed

## 2018-09-25 NOTE — Progress Notes (Signed)
Subjective:   Kamya Watling is a 72 y.o. female who presents for Medicare Annual (Subsequent) preventive examination.  Review of Systems:  No ROS.  Medicare Wellness Visit. Additional risk factors are reflected in the social history. Cardiac Risk Factors include: advanced age (>57men, >30 women);hypertension;obesity (BMI >30kg/m2)     Objective:     Vitals: BP 122/68 (BP Location: Left Arm, Patient Position: Sitting, Cuff Size: Normal)   Pulse 71   Temp 98.3 F (36.8 C) (Oral)   Resp 15   Ht 5\' 6"  (1.676 m)   Wt 203 lb 8 oz (92.3 kg)   SpO2 98%   BMI 32.85 kg/m   Body mass index is 32.85 kg/m.  Advanced Directives 09/25/2018 09/15/2017  Does Patient Have a Medical Advance Directive? Yes Yes  Type of Advance Directive Living will Living will  Does patient want to make changes to medical advance directive? - No - Patient declined    Tobacco Social History   Tobacco Use  Smoking Status Former Smoker  . Last attempt to quit: 04/10/1997  . Years since quitting: 21.4  Smokeless Tobacco Never Used     Counseling given: Not Answered   Clinical Intake:  Pre-visit preparation completed: Yes  Pain : No/denies pain     Nutritional Status: BMI > 30  Obese Diabetes: No  How often do you need to have someone help you when you read instructions, pamphlets, or other written materials from your doctor or pharmacy?: 1 - Never  Interpreter Needed?: No     Past Medical History:  Diagnosis Date  . Cancer [facility]) 2001   Right breast, s/p chemotherapy, XRT and mastectomy, Dr. Jeb Levering  . Colon polyps   . Hyperlipidemia   . Hypertension   . Personal history of chemotherapy   . Personal history of radiation therapy    Past Surgical History:  Procedure Laterality Date  . ABDOMINAL HYSTERECTOMY  1998   cervix intact  . APPENDECTOMY    . BREAST SURGERY  2002   mastectomy  . CHOLECYSTECTOMY    . MASTECTOMY Right 2001   chemo, rad  . TUBAL LIGATION     Family  History  Problem Relation Age of Onset  . Arthritis Mother   . Arthritis Sister   . Stroke Sister        paralyzed left side  . Breast cancer Sister 71  . Cancer Sister        breast  . Heart disease Brother   . Asthma Brother   . Breast cancer Other   . Breast cancer Cousin        maternal - 75's   Social History   Socioeconomic History  . Marital status: Married    Spouse name: Not on file  . Number of children: Not on file  . Years of education: Not on file  . Highest education level: Not on file  Occupational History  . Not on file  Social Needs  . Financial resource strain: Not hard at all  . Food insecurity:    Worry: Never true    Inability: Never true  . Transportation needs:    Medical: No    Non-medical: No  Tobacco Use  . Smoking status: Former Smoker    Last attempt to quit: 04/10/1997    Years since quitting: 21.4  . Smokeless tobacco: Never Used  Substance and Sexual Activity  . Alcohol use: Yes    Alcohol/week: 1.0 standard drinks    Types:  1 Glasses of wine per week  . Drug use: No  . Sexual activity: Not Currently  Lifestyle  . Physical activity:    Days per week: 3 days    Minutes per session: 60 min  . Stress: Not at all  Relationships  . Social connections:    Talks on phone: Not on file    Gets together: Not on file    Attends religious service: Not on file    Active member of club or organization: Not on file    Attends meetings of clubs or organizations: Not on file    Relationship status: Not on file  Other Topics Concern  . Not on file  Social History Narrative   Lives in [place] with husband. No pets      Work - retired, Surveyor, minerals      Diet - regular      Exercise - none    Outpatient Encounter Medications as of 09/25/2018  Medication Sig  . amLODipine (NORVASC) 5 MG tablet Take 1 tablet (5 mg total) by mouth daily.  Marland Kitchen aspirin 81 MG tablet Take 81 mg by mouth daily.  . Cholecalciferol (VITAMIN D) 2000 units  tablet Take 1 tablet by mouth daily.  Marland Kitchen losartan (COZAAR) 100 MG tablet Take 1 tablet (100 mg total) by mouth daily.  . metoprolol succinate (TOPROL-XL) 50 MG 24 hr tablet TAKE 1 TABLET BY MOUTH ONCE A DAY IMMEDIATELY FOLLOWING A MEAL  . Multiple Vitamin (MULTIVITAMIN) tablet Take 1 tablet by mouth daily.  . naproxen sodium (ANAPROX) 220 MG tablet Take 220 mg by mouth 2 (two) times daily with a meal.  . simvastatin (ZOCOR) 20 MG tablet Take 1 tablet (20 mg total) by mouth daily.  . [DISCONTINUED] cyclobenzaprine (FLEXERIL) 5 MG tablet TAKE 1 TABLET BY MOUTH EVERY NIGHT AT BEDTIME AS NEEDED FOR MUSCLE SPASMS (Patient not taking: Reported on 09/25/2018)  . [DISCONTINUED] hydrochlorothiazide (HYDRODIURIL) 12.5 MG tablet Take 1 tablet (12.5 mg total) by mouth daily.  . [DISCONTINUED] hydrochlorothiazide (MICROZIDE) 12.5 MG capsule TAKE 1 CAPSULE BY MOUTH ONCE DAILY  . [DISCONTINUED] sertraline (ZOLOFT) 50 MG tablet TAKE 1 TABLET BY MOUTH ONCE DAILY   No facility-administered encounter medications on file as of 09/25/2018.     Activities of Daily Living In your present state of health, do you have any difficulty performing the following activities: 09/25/2018  Hearing? N  Vision? N  Difficulty concentrating or making decisions? N  Walking or climbing stairs? N  Dressing or bathing? N  Doing errands, shopping? N  Preparing Food and eating ? N  Using the Toilet? N  In the past six months, have you accidently leaked urine? N  Do you have problems with loss of bowel control? N  Managing your Medications? N  Managing your Finances? N  Housekeeping or managing your Housekeeping? N  Some recent data might be hidden    Patient Care Team: Burnard Hawthorne, FNP as PCP - General (Family Medicine)    Assessment:   This is a routine wellness examination for Arp. The goal of the wellness visit is to assist the patient how to close the gaps in care and create a preventative care plan for the  patient.   The roster of all physicians providing medical care to patient is listed in the Snapshot section of the chart.  Taking calcium VIT D as appropriate/Osteoporosis risk reviewed.    Safety issues reviewed; Smoke and carbon monoxide detectors in the home. Firearm safety  discussed. Wears seatbelts when driving or riding with others. No violence in the home.  They do not have excessive sun exposure.  Discussed the need for sun protection: hats, long sleeves and the use of sunscreen if there is significant sun exposure.  Patient is alert, normal appearance, oriented to person/place/and time.  Correctly identified the president of the [place] and recalls of 3/3 words. Performs simple calculations and can read correct time from watch face.  Displays appropriate judgement.  No new identified risk were noted.  No failures at ADL's or IADL's.    BMI- discussed the importance of a healthy diet, water intake and the benefits of aerobic exercise. Educational material provided.   24 hour diet recall: Moderate diet  Dental- UTD.   Eye- Visual acuity not assessed per patient preference since they have regular follow up with the ophthalmologist.  Wears corrective lenses.  Sleep patterns- Sleeps 6-7 hours at night.  Wakes feeling rested.   Influenza vaccine discussed.   Health maintenance gaps- closed.  Patient Concerns: None at this time. Follow up with PCP as needed.  Exercise Activities and Dietary recommendations Current Exercise Habits: Home exercise routine, Type of exercise: walking, Time (Minutes): 60, Frequency (Times/Week): 3, Weekly Exercise (Minutes/Week): 180, Intensity: Moderate  Goals      Patient Stated   . Low carb diet (pt-stated)     Monitor diet       Fall Risk Fall Risk  09/25/2018 09/15/2017 03/16/2017 09/16/2015 09/02/2014  Falls in the past year? 0 No No No No   Depression Screen PHQ 2/9 Scores 09/25/2018 09/15/2017 03/16/2017 09/16/2015  PHQ - 2 Score  0 1 0 0  PHQ- 9 Score - 2 - -     Cognitive Function MMSE - Mini Mental State Exam 09/15/2017  Orientation to time 5  Orientation to Place 5  Registration 3  Attention/ Calculation 5  Recall 3  Language- name 2 objects 2  Language- repeat 1  Language- follow 3 step command 3  Language- read & follow direction 1  Write a sentence 1  Copy design 1  Total score 30     6CIT Screen 09/25/2018  What Year? 0 points  What month? 0 points  What time? 0 points  Count back from 20 0 points  Months in reverse 0 points  Repeat phrase 0 points  Total Score 0    Immunization History  Administered Date(s) Administered  . Influenza, High Dose Seasonal PF 09/15/2016, 09/15/2017  . Influenza,inj,Quad PF,6+ Mos 10/15/2013, 09/02/2014, 09/16/2015  . Influenza-Unspecified 09/10/2012  . Pneumococcal Conjugate-13 04/19/2014  . Pneumococcal Polysaccharide-23 04/10/2013  . Tdap 09/15/2016  . Zoster 04/11/2011   Screening Tests Health Maintenance  Topic Date Due  . INFLUENZA VACCINE  06/22/2018  . MAMMOGRAM  11/19/2019  . COLONOSCOPY  03/11/2020  . TETANUS/TDAP  09/15/2026  . DEXA SCAN  Completed  . Hepatitis C Screening  Completed      Plan:    End of life planning; Advance aging; Advanced directives discussed. Copy of current HCPOA/Living Will on file.    I have personally reviewed and noted the following in the patient's chart:   . Medical and social history . Use of alcohol, tobacco or illicit drugs  . Current medications and supplements . Functional ability and status . Nutritional status . Physical activity . Advanced directives . List of other physicians . Hospitalizations, surgeries, and ER visits in previous 12 months . Vitals . Screenings to include cognitive, depression, and falls .  Referrals and appointments  In addition, I have reviewed and discussed with patient certain preventive protocols, quality metrics, and best practice recommendations. A written  personalized care plan for preventive services as well as general preventive health recommendations were provided to patient.     Varney Biles, LPN  17/02/813    Agree with plan. Mable Paris, NP

## 2018-09-25 NOTE — Assessment & Plan Note (Signed)
Resolved

## 2018-09-25 NOTE — Assessment & Plan Note (Signed)
Pleased that she has had improvement.  We will go ahead and adjunct with Wellbutrin to help with overeating, fatigue.  Close follow-up in 6 weeks.

## 2018-09-25 NOTE — Assessment & Plan Note (Signed)
Clinical breast exam performed.  Deferred pelvic exam in the absence of pelvic complaints, history of hysterectomy.  Patient will call and schedule her mammogram, bone density test.  She politely declines CT head neck as she is unable to appreciate enlarged lymph node.  Normal HEENT exam as well today.  I think this is reasonable.  I did encourage her to stay very vigilant.  She also states she is unable to feel any right axillary mass at this time, normal breast exam so I think this is appropriate as well.  Again, advised her to stay vigilant.

## 2018-11-06 ENCOUNTER — Ambulatory Visit (INDEPENDENT_AMBULATORY_CARE_PROVIDER_SITE_OTHER): Payer: PPO | Admitting: Family

## 2018-11-06 ENCOUNTER — Encounter: Payer: Self-pay | Admitting: Family

## 2018-11-06 DIAGNOSIS — E782 Mixed hyperlipidemia: Secondary | ICD-10-CM

## 2018-11-06 DIAGNOSIS — I10 Essential (primary) hypertension: Secondary | ICD-10-CM | POA: Diagnosis not present

## 2018-11-06 DIAGNOSIS — F339 Major depressive disorder, recurrent, unspecified: Secondary | ICD-10-CM

## 2018-11-06 MED ORDER — BUPROPION HCL ER (XL) 300 MG PO TB24: 300.0000 mg | ORAL_TABLET | Freq: Every morning | ORAL | 1 refills | Status: DC

## 2018-11-06 NOTE — Assessment & Plan Note (Signed)
Very pleased with improvement of mood, fatigue.  We will continue Wellbutrin.

## 2018-11-06 NOTE — Assessment & Plan Note (Signed)
Long discussion in regards to patient's family risk of stroke, and her personal elevated risk of cardiovascular disease, per AHA guidelines 17%.  Discussed whether not she should increase the Zocor.  I explained this would be a joint decision, as her total and LDL cholesterol is within normal limits at this time.  We will continue to monitor for now ; I have also consulted with our pharmacist to see what her advice may be.  Will follow

## 2018-11-06 NOTE — Progress Notes (Signed)
Subjective:    Patient ID: Michelle Clark, female    DOB: September 07, 1946, 72 y.o.   MRN: [id number]  CC: Michelle Clark is a 72 y.o. female who presents today for follow up.   HPI: Depression - Mood and energy has improved on wellbutrin.  No SI/HI.  Can tell that she is overeating less on medication.  Continues to walk for exercise.  htn-compliant with medication.  No chest pain  HLD- zocor 20mg   No bleeding.   On asa 81mg      HISTORY:  Past Medical History:  Diagnosis Date  . Cancer [facility]) 2001   Right breast, s/p chemotherapy, XRT and mastectomy, Dr. Jeb Levering  . Colon polyps   . Hyperlipidemia   . Hypertension   . Personal history of chemotherapy   . Personal history of radiation therapy    Past Surgical History:  Procedure Laterality Date  . ABDOMINAL HYSTERECTOMY  1998   cervix intact  . APPENDECTOMY    . BREAST SURGERY  2002   mastectomy  . CHOLECYSTECTOMY    . MASTECTOMY Right 2001   chemo, rad  . TUBAL LIGATION     Family History  Problem Relation Age of Onset  . Arthritis Mother   . Stroke Mother 73  . Arthritis Sister   . Stroke Sister 68       paralyzed left side  . Breast cancer Sister 64  . Cancer Sister        breast  . Heart disease Brother   . Asthma Brother   . Breast cancer Other   . Breast cancer Cousin        maternal - 30's    Allergies: Fluoxetine Current Outpatient Medications on File Prior to Visit  Medication Sig Dispense Refill  . amLODipine (NORVASC) 5 MG tablet Take 1 tablet (5 mg total) by mouth daily. 90 tablet 3  . aspirin 81 MG tablet Take 81 mg by mouth daily.    . Cholecalciferol (VITAMIN D) 2000 units tablet Take 1 tablet by mouth daily.    . hydrochlorothiazide (MICROZIDE) 12.5 MG capsule Take 1 capsule (12.5 mg total) by mouth daily. 90 capsule 1  . losartan (COZAAR) 100 MG tablet Take 1 tablet (100 mg total) by mouth daily. 90 tablet 3  . metoprolol succinate (TOPROL-XL) 50 MG 24 hr tablet TAKE 1 TABLET BY MOUTH  ONCE A DAY IMMEDIATELY FOLLOWING A MEAL 30 tablet 1  . Multiple Vitamin (MULTIVITAMIN) tablet Take 1 tablet by mouth daily.    . naproxen sodium (ANAPROX) 220 MG tablet Take 220 mg by mouth 2 (two) times daily with a meal.    . sertraline (ZOLOFT) 50 MG tablet Take 1 tablet (50 mg total) by mouth daily. 90 tablet 3  . simvastatin (ZOCOR) 20 MG tablet Take 1 tablet (20 mg total) by mouth daily. 90 tablet 3   No current facility-administered medications on file prior to visit.     Social History   Tobacco Use  . Smoking status: Former Smoker    Last attempt to quit: 04/10/1997    Years since quitting: 21.5  . Smokeless tobacco: Never Used  Substance Use Topics  . Alcohol use: Yes    Alcohol/week: 1.0 standard drinks    Types: 1 Glasses of wine per week  . Drug use: No    Review of Systems  Constitutional: Negative for chills, fatigue and fever. Diaphoresis: improved.  Respiratory: Negative for cough.   Cardiovascular: Negative for chest pain and  palpitations.  Gastrointestinal: Negative for nausea and vomiting.  Psychiatric/Behavioral: Negative for suicidal ideas.      Objective:    BP 122/72 (BP Location: Right Arm, Patient Position: Sitting, Cuff Size: Large)   Pulse 73   Temp 98.4 F (36.9 C)   Wt 201 lb 12.8 oz (91.5 kg)   SpO2 98%   BMI 32.57 kg/m  BP Readings from Last 3 Encounters:  11/06/18 122/72  09/25/18 132/78  09/25/18 122/68   Wt Readings from Last 3 Encounters:  11/06/18 201 lb 12.8 oz (91.5 kg)  09/25/18 204 lb 8 oz (92.8 kg)  09/25/18 203 lb 8 oz (92.3 kg)    Physical Exam Vitals signs reviewed.  Constitutional:      Appearance: She is well-developed.  Eyes:     Conjunctiva/sclera: Conjunctivae normal.  Cardiovascular:     Rate and Rhythm: Normal rate and regular rhythm.     Pulses: Normal pulses.     Heart sounds: Normal heart sounds.  Pulmonary:     Effort: Pulmonary effort is normal.     Breath sounds: Normal breath sounds. No wheezing,  rhonchi or rales.  Skin:    General: Skin is warm and dry.  Neurological:     Mental Status: She is alert.  Psychiatric:        Speech: Speech normal.        Behavior: Behavior normal.        Thought Content: Thought content normal.        Assessment & Plan:   Problem List Items Addressed This Visit      Cardiovascular and Mediastinum   Essential hypertension, benign    At goal, continue current regimen        Other   Hyperlipidemia    Long discussion in regards to patient's family risk of stroke, and her personal elevated risk of cardiovascular disease, per AHA guidelines 17%.  Discussed whether not she should increase the Zocor.  I explained this would be a joint decision, as her total and LDL cholesterol is within normal limits at this time.  We will continue to monitor for now ; I have also consulted with our pharmacist to see what her advice may be.  Will follow      Depression, recurrent ([place])    Very pleased with improvement of mood, fatigue.  We will continue Wellbutrin.      Relevant Medications   buPROPion (WELLBUTRIN XL) 300 MG 24 hr tablet       I have changed Michelle Clark's buPROPion. I am also having her maintain her multivitamin, aspirin, naproxen sodium, Vitamin D, amLODipine, losartan, simvastatin, metoprolol succinate, sertraline, and hydrochlorothiazide.   Meds ordered this encounter  Medications  . buPROPion (WELLBUTRIN XL) 300 MG 24 hr tablet    Sig: Take 1 tablet (300 mg total) by mouth every morning.    Dispense:  90 tablet    Refill:  1    Order Specific Question:   Supervising Provider    Answer:   Michelle Clark [[id number]]    Return precautions given.   Risks, benefits, and alternatives of the medications and treatment plan prescribed today were discussed, and patient expressed understanding.   Education regarding symptom management and diagnosis given to patient on AVS.  Continue to follow with Michelle Hawthorne, FNP for routine  health maintenance.   Michelle Clark and I agreed with plan.   Michelle Paris, FNP

## 2018-11-06 NOTE — Assessment & Plan Note (Signed)
At goal, continue current regimen 

## 2018-11-06 NOTE — Patient Instructions (Signed)
So great to see you today! I will get back to you in regards to if we should increase you cholesterol medication.

## 2018-11-08 ENCOUNTER — Encounter: Payer: Self-pay | Admitting: Family

## 2018-11-20 ENCOUNTER — Ambulatory Visit
Admission: RE | Admit: 2018-11-20 | Discharge: 2018-11-20 | Disposition: A | Discharge status: Home / Self Care | Payer: PPO | Source: Ambulatory Visit | Attending: Family | Admitting: Family

## 2018-11-20 DIAGNOSIS — M8589 Other specified disorders of bone density and structure, multiple sites: Secondary | ICD-10-CM | POA: Diagnosis not present

## 2018-11-20 DIAGNOSIS — Z13820 Encounter for screening for osteoporosis: Secondary | ICD-10-CM | POA: Insufficient documentation

## 2018-11-20 DIAGNOSIS — Z780 Asymptomatic menopausal state: Secondary | ICD-10-CM | POA: Diagnosis not present

## 2018-11-20 DIAGNOSIS — Z9011 Acquired absence of right breast and nipple: Secondary | ICD-10-CM | POA: Diagnosis not present

## 2018-11-20 DIAGNOSIS — Z0000 Encounter for general adult medical examination without abnormal findings: Secondary | ICD-10-CM

## 2018-11-20 DIAGNOSIS — Z1231 Encounter for screening mammogram for malignant neoplasm of breast: Secondary | ICD-10-CM | POA: Insufficient documentation

## 2018-11-20 DIAGNOSIS — M8588 Other specified disorders of bone density and structure, other site: Secondary | ICD-10-CM | POA: Diagnosis not present

## 2018-12-08 ENCOUNTER — Other Ambulatory Visit: Payer: Self-pay | Admitting: Family Medicine

## 2019-02-05 ENCOUNTER — Other Ambulatory Visit: Payer: Self-pay | Admitting: Family Medicine

## 2019-02-05 DIAGNOSIS — I10 Essential (primary) hypertension: Secondary | ICD-10-CM

## 2019-02-13 ENCOUNTER — Other Ambulatory Visit: Payer: Self-pay | Admitting: Family

## 2019-04-28 ENCOUNTER — Other Ambulatory Visit: Payer: Self-pay | Admitting: Family

## 2019-05-08 ENCOUNTER — Other Ambulatory Visit: Payer: Self-pay

## 2019-05-09 ENCOUNTER — Ambulatory Visit (INDEPENDENT_AMBULATORY_CARE_PROVIDER_SITE_OTHER): Payer: PPO | Admitting: Family

## 2019-05-09 ENCOUNTER — Encounter: Payer: Self-pay | Admitting: Family

## 2019-05-09 DIAGNOSIS — F339 Major depressive disorder, recurrent, unspecified: Secondary | ICD-10-CM | POA: Diagnosis not present

## 2019-05-09 DIAGNOSIS — I10 Essential (primary) hypertension: Secondary | ICD-10-CM | POA: Diagnosis not present

## 2019-05-09 MED ORDER — SERTRALINE HCL 100 MG PO TABS: 100.0000 mg | ORAL_TABLET | Freq: Every day | ORAL | 1 refills | Status: DC

## 2019-05-09 NOTE — Progress Notes (Signed)
Verbal consent for services obtained from patient prior to services given.  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  No other concerns at this time  Depression- increased of late during pandemic.  Feels like hard to get out bed. On wellbutrin, zoloft. Sleeping well.   Son is currently hospitalized and she cannot visit. No si/hi.   HTN- at home 133/78.  Denies exertional chest pain or pressure, numbness or tingling radiating to left arm or jaw, palpitations, dizziness, frequent headaches, changes in vision, or shortness of breath.   HLD- on 20mg  zocor   History, background, results pertinent:  History of right breast cancer  A/P/next steps:  Problem List Items Addressed This Visit      Cardiovascular and Mediastinum   Essential hypertension, benign    At goal, continue current regimen        Other   Depression, recurrent ([place]) - Primary    Some worsening of late.  Depression screen [facility] 2/9 05/09/2019 11/06/2018 09/25/2018  Decreased Interest 2 0 0  Down, Depressed, Hopeless 2 0 0  PHQ - 2 Score 4 0 0  Altered sleeping 0 0 -  Tired, decreased energy 2 0 -  Change in appetite 0 0 -  Feeling bad or failure about yourself  0 0 -  Trouble concentrating 0 0 -  Moving slowly or fidgety/restless 0 0 -  Suicidal thoughts 0 0 -  PHQ-9 Score 6 0 -  Difficult doing work/chores Not difficult at all Not difficult at all -     Increase Zoloft to 100 mg.  Patient will let me know how she is doing.      Relevant Medications   sertraline (ZOLOFT) 100 MG tablet       I spent 15 min  discussing plan of care over the phone.

## 2019-05-09 NOTE — Patient Instructions (Addendum)
Increase zoloft 100mg  at night.   Let me know how you are doing.  Please schedule physical in November/december with one of colleagues since I will be on maternity leave.   Stay safe!

## 2019-05-09 NOTE — Assessment & Plan Note (Signed)
At goal, continue current regimen 

## 2019-05-09 NOTE — Assessment & Plan Note (Addendum)
Some worsening of late.  Depression screen [facility] 2/9 05/09/2019 11/06/2018 09/25/2018  Decreased Interest 2 0 0  Down, Depressed, Hopeless 2 0 0  PHQ - 2 Score 4 0 0  Altered sleeping 0 0 -  Tired, decreased energy 2 0 -  Change in appetite 0 0 -  Feeling bad or failure about yourself  0 0 -  Trouble concentrating 0 0 -  Moving slowly or fidgety/restless 0 0 -  Suicidal thoughts 0 0 -  PHQ-9 Score 6 0 -  Difficult doing work/chores Not difficult at all Not difficult at all -     Increase Zoloft to 100 mg.  Patient will let me know how she is doing.

## 2019-05-11 NOTE — Progress Notes (Signed)
Printed and mailed

## 2019-05-22 ENCOUNTER — Other Ambulatory Visit: Payer: Self-pay | Admitting: Family Medicine

## 2019-05-22 ENCOUNTER — Other Ambulatory Visit: Payer: Self-pay | Admitting: Family

## 2019-05-22 DIAGNOSIS — F339 Major depressive disorder, recurrent, unspecified: Secondary | ICD-10-CM

## 2019-07-02 ENCOUNTER — Other Ambulatory Visit: Payer: Self-pay | Admitting: Family

## 2019-07-18 ENCOUNTER — Other Ambulatory Visit: Payer: Self-pay | Admitting: Family Medicine

## 2019-07-18 ENCOUNTER — Telehealth: Payer: Self-pay | Admitting: Family

## 2019-07-18 DIAGNOSIS — I10 Essential (primary) hypertension: Secondary | ICD-10-CM

## 2019-07-18 NOTE — Telephone Encounter (Signed)
Call pt Refilled hctz however she hadnt had labs since 10/2018 She is overdue Please advise, order and schedule cmp lab

## 2019-07-18 NOTE — Telephone Encounter (Signed)
I spoke with patient & she is scheduled for labs 08/01/19.

## 2019-07-31 ENCOUNTER — Telehealth: Payer: Self-pay | Admitting: *Deleted

## 2019-07-31 NOTE — Telephone Encounter (Signed)
Please place future orders for lab appt.  

## 2019-08-01 ENCOUNTER — Other Ambulatory Visit (INDEPENDENT_AMBULATORY_CARE_PROVIDER_SITE_OTHER): Payer: PPO

## 2019-08-01 ENCOUNTER — Other Ambulatory Visit: Payer: Self-pay

## 2019-08-01 DIAGNOSIS — I10 Essential (primary) hypertension: Secondary | ICD-10-CM

## 2019-08-01 DIAGNOSIS — Z23 Encounter for immunization: Secondary | ICD-10-CM | POA: Diagnosis not present

## 2019-08-01 LAB — COMPREHENSIVE METABOLIC PANEL
ALT: 18 U/L (ref 0–35)
AST: 18 U/L (ref 0–37)
Albumin: 4.2 g/dL (ref 3.5–5.2)
Alkaline Phosphatase: 88 U/L (ref 39–117)
BUN: 15 mg/dL (ref 6–23)
CO2: 29 mEq/L (ref 19–32)
Calcium: 9.4 mg/dL (ref 8.4–10.5)
Chloride: 102 mEq/L (ref 96–112)
Creatinine, Ser: 0.78 mg/dL (ref 0.40–1.20)
GFR: 87.22 mL/min (ref >60.00)
Glucose, Bld: 82 mg/dL (ref 70–99)
Potassium: 3.7 mEq/L (ref 3.5–5.1)
Sodium: 138 mEq/L (ref 135–145)
Total Bilirubin: 0.4 mg/dL (ref 0.2–1.2)
Total Protein: 7.2 g/dL (ref 6.0–8.3)

## 2019-08-01 NOTE — Progress Notes (Unsigned)
CMP

## 2019-08-01 NOTE — Telephone Encounter (Signed)
SECOND REQUEST FOR ORDERS, PT HAS A 10:30 LAB APPT THIS MORNING.

## 2019-08-01 NOTE — Telephone Encounter (Signed)
done

## 2019-08-01 NOTE — Telephone Encounter (Signed)
I have ordered CMP for patient to check potassium for starting HCTZ.

## 2019-08-03 ENCOUNTER — Other Ambulatory Visit: Payer: Self-pay | Admitting: Family

## 2019-08-27 ENCOUNTER — Other Ambulatory Visit: Payer: Self-pay | Admitting: Family

## 2019-08-27 DIAGNOSIS — F339 Major depressive disorder, recurrent, unspecified: Secondary | ICD-10-CM

## 2019-09-17 DIAGNOSIS — H2511 Age-related nuclear cataract, right eye: Secondary | ICD-10-CM | POA: Diagnosis not present

## 2019-09-25 ENCOUNTER — Other Ambulatory Visit: Payer: Self-pay

## 2019-09-28 ENCOUNTER — Encounter: Payer: Self-pay | Admitting: Family Medicine

## 2019-09-28 ENCOUNTER — Ambulatory Visit (INDEPENDENT_AMBULATORY_CARE_PROVIDER_SITE_OTHER): Payer: PPO | Admitting: Family Medicine

## 2019-09-28 ENCOUNTER — Ambulatory Visit (INDEPENDENT_AMBULATORY_CARE_PROVIDER_SITE_OTHER): Payer: PPO

## 2019-09-28 ENCOUNTER — Ambulatory Visit: Payer: PPO | Admitting: Family

## 2019-09-28 ENCOUNTER — Other Ambulatory Visit: Payer: Self-pay

## 2019-09-28 VITALS — BP 128/76 | HR 90 | Temp 96.4°F | Ht 65.0 in | Wt 205.0 lb

## 2019-09-28 DIAGNOSIS — F339 Major depressive disorder, recurrent, unspecified: Secondary | ICD-10-CM

## 2019-09-28 DIAGNOSIS — R7303 Prediabetes: Secondary | ICD-10-CM | POA: Diagnosis not present

## 2019-09-28 DIAGNOSIS — E782 Mixed hyperlipidemia: Secondary | ICD-10-CM | POA: Diagnosis not present

## 2019-09-28 DIAGNOSIS — Z0000 Encounter for general adult medical examination without abnormal findings: Secondary | ICD-10-CM

## 2019-09-28 DIAGNOSIS — I10 Essential (primary) hypertension: Secondary | ICD-10-CM | POA: Diagnosis not present

## 2019-09-28 DIAGNOSIS — Z1231 Encounter for screening mammogram for malignant neoplasm of breast: Secondary | ICD-10-CM

## 2019-09-28 DIAGNOSIS — Z1211 Encounter for screening for malignant neoplasm of colon: Secondary | ICD-10-CM | POA: Diagnosis not present

## 2019-09-28 LAB — COMPREHENSIVE METABOLIC PANEL
ALT: 15 U/L (ref 0–35)
AST: 16 U/L (ref 0–37)
Albumin: 4.5 g/dL (ref 3.5–5.2)
Alkaline Phosphatase: 92 U/L (ref 39–117)
BUN: 15 mg/dL (ref 6–23)
CO2: 28 mEq/L (ref 19–32)
Calcium: 9.4 mg/dL (ref 8.4–10.5)
Chloride: 102 mEq/L (ref 96–112)
Creatinine, Ser: 0.82 mg/dL (ref 0.40–1.20)
GFR: 82.29 mL/min (ref >60.00)
Glucose, Bld: 87 mg/dL (ref 70–99)
Potassium: 3.8 mEq/L (ref 3.5–5.1)
Sodium: 139 mEq/L (ref 135–145)
Total Bilirubin: 0.3 mg/dL (ref 0.2–1.2)
Total Protein: 7.3 g/dL (ref 6.0–8.3)

## 2019-09-28 LAB — CBC
HCT: 40.1 % (ref 36.0–46.0)
Hemoglobin: 13 g/dL (ref 12.0–15.0)
MCHC: 32.4 g/dL (ref 30.0–36.0)
MCV: 87.4 fl (ref 78.0–100.0)
Platelets: 217 10*3/uL (ref 150.0–400.0)
RBC: 4.58 Mil/uL (ref 3.87–5.11)
RDW: 14.3 % (ref 11.5–15.5)
WBC: 8 10*3/uL (ref 4.0–10.5)

## 2019-09-28 LAB — LIPID PANEL
Cholesterol: 157 mg/dL (ref 0–200)
HDL: 53.9 mg/dL (ref >39.00)
LDL Cholesterol: 87 mg/dL (ref 0–99)
NonHDL: 103.5
Total CHOL/HDL Ratio: 3
Triglycerides: 85 mg/dL (ref 0.0–149.0)
VLDL: 17 mg/dL (ref 0.0–40.0)

## 2019-09-28 LAB — TSH: TSH: 3.67 u[IU]/mL (ref 0.35–4.50)

## 2019-09-28 LAB — HEMOGLOBIN A1C: Hgb A1c MFr Bld: 5.8 % (ref 4.6–6.5)

## 2019-09-28 MED ORDER — BUPROPION HCL ER (XL) 300 MG PO TB24: 300.0000 mg | ORAL_TABLET | Freq: Every morning | ORAL | 1 refills | Status: DC

## 2019-09-28 NOTE — Patient Instructions (Addendum)
  [name] , Thank you for taking time to come for your Medicare Wellness Visit. I appreciate your ongoing commitment to your health goals. Please review the following plan we discussed and let me know if I can assist you in the future.   These are the goals we discussed: Goals      Patient Stated   . Low carb diet (pt-stated)     Monitor diet       This is a list of the screening recommended for you and due dates:  Health Maintenance  Topic Date Due  . Colon Cancer Screening  03/11/2020  . Mammogram  11/20/2020  . Tetanus Vaccine  09/15/2026  . Flu Shot  Completed  . DEXA scan (bone density measurement)  Completed  .  Hepatitis C: One time screening is recommended by Center for Disease Control  (CDC) for  adults born from [birth year] through 1965.   Completed

## 2019-09-28 NOTE — Progress Notes (Signed)
Subjective:   Michelle Clark is a 73 y.o. female who presents for Medicare Annual (Subsequent) preventive examination.  Review of Systems:  No ROS.  Medicare Wellness Virtual Visit.  Visual/audio telehealth visit, UTA vital signs.   See social history for additional risk factors.   Cardiac Risk Factors include: advanced age (>71men, >12 women);hypertension     Objective:     Vitals: There were no vitals taken for this visit.  There is no height or weight on file to calculate BMI.  Advanced Directives 09/28/2019 09/25/2018 09/15/2017  Does Patient Have a Medical Advance Directive? Yes Yes Yes  Type of Paramedic of [place];Living will Living will Living will  Does patient want to make changes to medical advance directive? No - Patient declined - No - Patient declined  Copy of [place] in Chart? Yes - validated most recent copy scanned in chart (See row information) - -    Tobacco Social History   Tobacco Use  Smoking Status Former Smoker  . Quit date: 04/10/1997  . Years since quitting: 22.4  Smokeless Tobacco Never Used     Counseling given: Not Answered   Clinical Intake:  Pre-visit preparation completed: Yes        Diabetes: No  How often do you need to have someone help you when you read instructions, pamphlets, or other written materials from your doctor or pharmacy?: 1 - Never  Interpreter Needed?: No     Past Medical History:  Diagnosis Date  . Cancer [facility]) 2001   Right breast, s/p chemotherapy, XRT and mastectomy, Dr. Jeb Levering  . Colon polyps   . Hyperlipidemia   . Hypertension   . Personal history of chemotherapy   . Personal history of radiation therapy    Past Surgical History:  Procedure Laterality Date  . ABDOMINAL HYSTERECTOMY  1998   cervix intact  . APPENDECTOMY    . BREAST SURGERY  2002   mastectomy  . CHOLECYSTECTOMY    . MASTECTOMY Right 2001   chemo, rad  . OOPHORECTOMY    . TUBAL  LIGATION     Family History  Problem Relation Age of Onset  . Arthritis Mother   . Stroke Mother 65  . Arthritis Sister   . Stroke Sister 68       paralyzed left side  . Breast cancer Sister 6  . Cancer Sister        breast  . Heart disease Brother   . Asthma Brother   . Breast cancer Other   . Breast cancer Cousin        maternal - 56's   Social History   Socioeconomic History  . Marital status: Married    Spouse name: Not on file  . Number of children: Not on file  . Years of education: Not on file  . Highest education level: Not on file  Occupational History  . Not on file  Social Needs  . Financial resource strain: Not hard at all  . Food insecurity    Worry: Never true    Inability: Never true  . Transportation needs    Medical: No    Non-medical: No  Tobacco Use  . Smoking status: Former Smoker    Quit date: 04/10/1997    Years since quitting: 22.4  . Smokeless tobacco: Never Used  Substance and Sexual Activity  . Alcohol use: Not Currently    Alcohol/week: 1.0 standard drinks    Types: 1  Glasses of wine per week  . Drug use: No  . Sexual activity: Not Currently  Lifestyle  . Physical activity    Days per week: 2 days    Minutes per session: 20 min  . Stress: Not at all  Relationships  . Social Herbalist on phone: Not on file    Gets together: Not on file    Attends religious service: Not on file    Active member of club or organization: Not on file    Attends meetings of clubs or organizations: Not on file    Relationship status: Not on file  Other Topics Concern  . Not on file  Social History Narrative   Lives in [place] with husband. No pets      Work - retired, Surveyor, minerals      Diet - regular      Exercise - none    Outpatient Encounter Medications as of 09/28/2019  Medication Sig  . amLODipine (NORVASC) 5 MG tablet TAKE 1 TABLET BY MOUTH ONCE A DAY  . aspirin 81 MG tablet Take 81 mg by mouth daily.  Marland Kitchen buPROPion  (WELLBUTRIN XL) 300 MG 24 hr tablet TAKE ONE TABLET BY MOUTH EVERY MORNING  . Cholecalciferol (VITAMIN D) 2000 units tablet Take 1 tablet by mouth daily.  . hydrochlorothiazide (MICROZIDE) 12.5 MG capsule TAKE 1 CAPSULE BY MOUTH ONCE DAILY  . losartan (COZAAR) 100 MG tablet TAKE 1 TABLET BY MOUTH ONCE A DAY  . metoprolol succinate (TOPROL-XL) 50 MG 24 hr tablet TAKE 1 TABLET BY MOUTH ONCE A DAY IMMEDIATELY FOLLOWING A MEAL.  . Multiple Vitamin (MULTIVITAMIN) tablet Take 1 tablet by mouth daily.  . naproxen sodium (ANAPROX) 220 MG tablet Take 220 mg by mouth 2 (two) times daily with a meal.  . sertraline (ZOLOFT) 100 MG tablet Take 1 tablet (100 mg total) by mouth at bedtime.  . simvastatin (ZOCOR) 20 MG tablet TAKE 1 TABLET BY MOUTH ONCE A DAY   No facility-administered encounter medications on file as of 09/28/2019.     Activities of Daily Living In your present state of health, do you have any difficulty performing the following activities: 09/28/2019  Hearing? N  Vision? N  Difficulty concentrating or making decisions? N  Walking or climbing stairs? N  Dressing or bathing? N  Doing errands, shopping? N  Preparing Food and eating ? N  Using the Toilet? N  In the past six months, have you accidently leaked urine? N  Do you have problems with loss of bowel control? N  Managing your Medications? N  Managing your Finances? N  Housekeeping or managing your Housekeeping? N  Some recent data might be hidden    Patient Care Team: Burnard Hawthorne, FNP as PCP - General (Family Medicine)    Assessment:   This is a routine wellness examination for [employer].  Nurse connected with patient 09/28/19 at  8:30 AM EST by a telephone enabled telemedicine application and verified that I am speaking with the correct person using two identifiers. Patient stated full name and DOB. Patient gave permission to continue with virtual visit. Patient's location was at home and Nurse's location was at [facility]  office.   Health Maintenance Due: -Shingles vaccine- plans to discuss further with doctor today -Mammogram- due 10/2019 -Eye Exam- cataract extraction scheduled 10/06/19 Update all pending maintenance due as appropriate.   See completed HM at the end of note.   Eye: Visual acuity not assessed. Virtual  visit. Wears corrective lenses. Followed by their ophthalmologist every 12 months.   Hearing: Demonstrates normal hearing during visit.  Safety:  Patient feels safe at home- yes Patient does have smoke detectors at home- yes Patient does wear sunscreen or protective clothing when in direct sunlight - yes Patient does wear seat belt when in a moving vehicle - yes Patient drives- yes Adequate lighting in walkways free from debris- yes Grab bars and handrails used as appropriate- yes Ambulates with no assistive device Cell phone on person when ambulating outside of the home- yes  Social: Alcohol intake - no       Smoking history- former   Smokers in home? none Illicit drug use? none  Depression: PHQ 2 &9 complete. See screening below. Denies irritability, anhedonia, sadness/tearfullness.  Stable.   Falls: See screening below.    Medication: Taking as directed and without issues. Pill box in use for management.   Covid-19: Precautions and sickness symptoms discussed. Wears mask, social distancing, hand hygiene as appropriate.   Activities of Daily Living Patient denies needing assistance with: household chores, feeding themselves, getting from bed to chair, getting to the toilet, bathing/showering, dressing, managing money, or preparing meals.   Memory: Patient is alert. Patient denies difficulty focusing or concentrating. Correctly identified the president of the [place], season and recall.  Patient likes to read and play brain games on the computer for brain stimulation.   BMI- discussed the importance of a healthy diet, water intake and the benefits of aerobic exercise.   Educational material provided.  Physical activity-  Active around the home, no routine.  Diet:  Low carb; monitors Water: good intake Caffeine: 1 cup of coffee  Other Providers Patient Care Team: Burnard Hawthorne, FNP as PCP - General (Family Medicine)  Exercise Activities and Dietary recommendations Current Exercise Habits: Home exercise routine, Time (Minutes): 20, Frequency (Times/Week): 2, Weekly Exercise (Minutes/Week): 40, Intensity: Mild  Goals      Patient Stated   . Low carb diet (pt-stated)     Monitor diet       Fall Risk Fall Risk  09/28/2019 09/25/2018 09/15/2017 03/16/2017 09/16/2015  Falls in the past year? 0 0 No No No   Timed Get Up and Go performed: no, virtual visit  Depression Screen PHQ 2/9 Scores 09/28/2019 05/09/2019 11/06/2018 09/25/2018  PHQ - 2 Score 0 4 0 0  PHQ- 9 Score - 6 0 -     Cognitive Function MMSE - Mini Mental State Exam 09/15/2017  Orientation to time 5  Orientation to Place 5  Registration 3  Attention/ Calculation 5  Recall 3  Language- name 2 objects 2  Language- repeat 1  Language- follow 3 step command 3  Language- read & follow direction 1  Write a sentence 1  Copy design 1  Total score 30     6CIT Screen 09/28/2019 09/25/2018  What Year? 0 points 0 points  What month? 0 points 0 points  What time? 0 points 0 points  Count back from 20 0 points 0 points  Months in reverse 0 points 0 points  Repeat phrase 0 points 0 points  Total Score 0 0    Immunization History  Administered Date(s) Administered  . Fluad Quad(high Dose 65+) 08/01/2019  . Influenza, High Dose Seasonal PF 09/15/2016, 09/15/2017, 09/25/2018  . Influenza,inj,Quad PF,6+ Mos 10/15/2013, 09/02/2014, 09/16/2015  . Influenza-Unspecified 09/10/2012  . Pneumococcal Conjugate-13 04/19/2014  . Pneumococcal Polysaccharide-23 04/10/2013  . Tdap 09/15/2016  . Zoster 04/11/2011  Screening Tests Health Maintenance  Topic Date Due  . COLONOSCOPY   03/11/2020  . MAMMOGRAM  11/20/2020  . TETANUS/TDAP  09/15/2026  . INFLUENZA VACCINE  Completed  . DEXA SCAN  Completed  . Hepatitis C Screening  Completed      Plan:    Keep all routine maintenance appointments.   Cpe today at 10:00. Schedule annual mammogram, discuss shingles vaccine.    Medicare Attestation I have personally reviewed: The patient's medical and social history Their use of alcohol, tobacco or illicit drugs Their current medications and supplements The patient's functional ability including ADLs,fall risks, home safety risks, cognitive, and hearing and visual impairment Diet and physical activities Evidence for depression   In addition, I have reviewed and discussed with patient certain preventive protocols, quality metrics, and best practice recommendations. A written personalized care plan for preventive services as well as general preventive health recommendations were provided to patient via mail.     Varney Biles, LPN  [phone]

## 2019-09-28 NOTE — Progress Notes (Signed)
Subjective:    Patient ID: Michelle Clark, female    DOB: 1946/06/08, 73 y.o.   MRN: [id number]  HPI   Patient presents to clinic for physical exam.  Overall she is feeling well.    Usually gets lab work with physicals each year.  Currently tolerating her medications for blood pressure, cholesterol, mood without any adverse side effects.  Had a right mastectomy many years ago, does get annual mammograms.  Due for mammogram in December.  DEXA scan up-to-date, last done in 2019.  Colonoscopy is due April 2021.  She is up-to-date on her flu vaccine as well as pneumonia vaccines.  Interested in shingles vaccine.  Does see eye doctor usually annually, will be needing cataract surgery.  Sees dentist every 6 months.   Patient Active Problem List   Diagnosis Date Noted  . Enlarged lymph node in neck 06/02/2018  . Muscle spasms of neck 04/10/2018  . Fatigue 04/10/2018  . Sacroiliac joint dysfunction of right side 04/04/2017  . Prediabetes 03/16/2017  . Routine health maintenance 09/15/2016  . Skin tag 09/15/2016  . Numbness 09/15/2016  . Left rotator cuff tear 03/29/2016  . Left shoulder pain 03/16/2016  . Edema 04/15/2015  . Depression, recurrent ([place]) 02/19/2015  . Bereavement 10/24/2014  . Obesity (BMI 30-39.9) 09/02/2014  . Postmenopausal estrogen deficiency 10/15/2013  . Essential hypertension, benign 04/10/2013  . Hyperlipidemia 04/10/2013  . History of breast cancer 04/10/2013   Social History   Tobacco Use  . Smoking status: Former Smoker    Quit date: 04/10/1997    Years since quitting: 22.4  . Smokeless tobacco: Never Used  Substance Use Topics  . Alcohol use: Not Currently    Alcohol/week: 1.0 standard drinks    Types: 1 Glasses of wine per week   Past Surgical History:  Procedure Laterality Date  . ABDOMINAL HYSTERECTOMY  1998   cervix intact  . APPENDECTOMY    . BREAST SURGERY  2002   mastectomy  . CHOLECYSTECTOMY    . MASTECTOMY Right 2001   chemo,  rad  . OOPHORECTOMY    . TUBAL LIGATION     Family History  Problem Relation Age of Onset  . Arthritis Mother   . Stroke Mother 76  . Arthritis Sister   . Stroke Sister 68       paralyzed left side  . Breast cancer Sister 38  . Cancer Sister        breast  . Heart disease Brother   . Asthma Brother   . Breast cancer Other   . Breast cancer Cousin        maternal - 30's   Review of Systems   Constitutional: Negative for chills, fatigue and fever.  HENT: Negative for congestion, ear pain, sinus pain and sore throat.   Eyes: Negative.   Respiratory: Negative for cough, shortness of breath and wheezing.   Cardiovascular: Negative for chest pain, palpitations and leg swelling.  Gastrointestinal: Negative for abdominal pain, diarrhea, nausea and vomiting.  Genitourinary: Negative for dysuria, frequency and urgency.  Musculoskeletal: Negative for arthralgias and myalgias.  Skin: Negative for color change, pallor and rash.  Neurological: Negative for syncope, light-headedness and headaches.  Psychiatric/Behavioral: The patient is not nervous/anxious.       Objective:   Physical Exam Vitals signs and nursing note reviewed.  Constitutional:      General: She is not in acute distress.    Appearance: She is obese. She is not ill-appearing, toxic-appearing or  diaphoretic.  HENT:     Head: Normocephalic and atraumatic.     Right Ear: Tympanic membrane, ear canal and external ear normal.     Left Ear: Tympanic membrane, ear canal and external ear normal.  Eyes:     General: No scleral icterus.    Extraocular Movements: Extraocular movements intact.     Conjunctiva/sclera: Conjunctivae normal.     Pupils: Pupils are equal, round, and reactive to light.  Neck:     Musculoskeletal: Normal range of motion. No neck rigidity or muscular tenderness.     Vascular: No carotid bruit.  Cardiovascular:     Rate and Rhythm: Normal rate and regular rhythm.     Heart sounds: Normal heart  sounds.  Pulmonary:     Effort: Pulmonary effort is normal. No respiratory distress.     Breath sounds: Normal breath sounds.  Chest:     Breasts:        Right: Absent.        Left: Normal. No swelling, bleeding, inverted nipple, mass, nipple discharge, skin change or tenderness.  Musculoskeletal:     Right lower leg: No edema.     Left lower leg: No edema.  Skin:    General: Skin is warm and dry.     Coloration: Skin is not jaundiced or pale.  Neurological:     General: No focal deficit present.     Mental Status: She is alert and oriented to person, place, and time.     Cranial Nerves: No cranial nerve deficit.     Motor: No weakness.     Gait: Gait normal.  Psychiatric:        Mood and Affect: Mood normal.        Behavior: Behavior normal.     Today's Vitals   09/28/19 0948  BP: 128/76  Pulse: 90  Temp: (!) 96.4 F (35.8 C)  TempSrc: Temporal  SpO2: 97%  Weight: 205 lb (93 kg)  Height: _0  (1.651 m)   Body mass index is 34.11 kg/m.     Assessment & Plan:    Breast cancer screening by mammogram - Plan: MM Digital Screening Unilat L  Depression, recurrent ([place]) - Plan: buPROPion (WELLBUTRIN XL) 300 MG 24 hr tablet  Well adult exam - Plan: CBC, Hemoglobin A1c, TSH, Lipid panel, Comp Met (CMET)  Essential hypertension, benign - Plan: TSH, Comp Met (CMET)  Mixed hyperlipidemia - Plan: Hemoglobin A1c, Lipid panel  Prediabetes - Plan: Hemoglobin A1c  Overall patient is a well-appearing 73 year old female, chronic medical issues are all stable.  We will get blood work in clinic today.  She will get mammogram.  Medication refill needed sent into pharmacy.  I will also place gastroenterology referral due to upcoming need for colonoscopy happening within the next calendar year.  Patient will follow-up annually for physical exam and at minimum every 6 months for management of multiple chronic medical conditions

## 2019-10-01 ENCOUNTER — Encounter: Payer: Self-pay | Admitting: *Deleted

## 2019-10-01 ENCOUNTER — Other Ambulatory Visit: Payer: Self-pay

## 2019-10-01 DIAGNOSIS — I10 Essential (primary) hypertension: Secondary | ICD-10-CM | POA: Diagnosis not present

## 2019-10-01 DIAGNOSIS — H2511 Age-related nuclear cataract, right eye: Secondary | ICD-10-CM | POA: Diagnosis not present

## 2019-10-02 NOTE — Progress Notes (Signed)
LPN note reviewed  Agree with note  Will follow up with PCP  LGuse FNP

## 2019-10-03 ENCOUNTER — Encounter: Payer: Self-pay | Admitting: Family

## 2019-10-04 ENCOUNTER — Other Ambulatory Visit: Payer: Self-pay | Admitting: Family

## 2019-10-04 DIAGNOSIS — F339 Major depressive disorder, recurrent, unspecified: Secondary | ICD-10-CM

## 2019-10-05 ENCOUNTER — Other Ambulatory Visit
Admission: RE | Admit: 2019-10-05 | Discharge: 2019-10-05 | Disposition: A | Discharge status: Home / Self Care | Payer: PPO | Source: Ambulatory Visit | Attending: Ophthalmology | Admitting: Ophthalmology

## 2019-10-05 ENCOUNTER — Other Ambulatory Visit: Payer: Self-pay

## 2019-10-05 DIAGNOSIS — Z01812 Encounter for preprocedural laboratory examination: Secondary | ICD-10-CM | POA: Insufficient documentation

## 2019-10-05 DIAGNOSIS — Z20828 Contact with and (suspected) exposure to other viral communicable diseases: Secondary | ICD-10-CM | POA: Diagnosis not present

## 2019-10-05 LAB — SARS CORONAVIRUS 2 (TAT 6-24 HRS): SARS Coronavirus 2: NEGATIVE

## 2019-10-05 NOTE — Discharge Instructions (Signed)

## 2019-10-08 NOTE — Anesthesia Preprocedure Evaluation (Addendum)
Anesthesia Evaluation  Patient identified by MRN, date of birth, ID band Patient awake    Reviewed: Allergy & Precautions, NPO status , Patient's Chart, lab work & pertinent test results  History of Anesthesia Complications Negative for: history of anesthetic complications  Airway Mallampati: IV   Neck ROM: Full    Dental  (+)    Pulmonary former smoker (quit 1998),    Pulmonary exam normal breath sounds clear to auscultation       Cardiovascular hypertension, Normal cardiovascular exam Rhythm:Regular Rate:Normal     Neuro/Psych PSYCHIATRIC DISORDERS Depression negative neurological ROS     GI/Hepatic negative GI ROS,   Endo/Other  Prediabetes   Renal/GU negative Renal ROS     Musculoskeletal   Abdominal   Peds  Hematology Breast CA   Anesthesia Other Findings   Reproductive/Obstetrics                            Anesthesia Physical Anesthesia Plan  ASA: II  Anesthesia Plan: MAC   Post-op Pain Management:    Induction: Intravenous  PONV Risk Score and Plan: 2 and TIVA, Midazolam and Treatment may vary due to age or medical condition  Airway Management Planned: Natural Airway  Additional Equipment:   Intra-op Plan:   Post-operative Plan:   Informed Consent: I have reviewed the patients History and Physical, chart, labs and discussed the procedure including the risks, benefits and alternatives for the proposed anesthesia with the patient or authorized representative who has indicated his/her understanding and acceptance.       Plan Discussed with: CRNA  Anesthesia Plan Comments:        Anesthesia Quick Evaluation

## 2019-10-09 ENCOUNTER — Ambulatory Visit: Payer: PPO | Admitting: Anesthesiology

## 2019-10-09 ENCOUNTER — Other Ambulatory Visit: Payer: Self-pay

## 2019-10-09 ENCOUNTER — Encounter
Admission: RE | Disposition: A | Discharge status: Home / Self Care | Payer: Self-pay | Source: Home / Self Care | Attending: Ophthalmology

## 2019-10-09 ENCOUNTER — Ambulatory Visit
Admission: RE | Admit: 2019-10-09 | Discharge: 2019-10-09 | Disposition: A | Discharge status: Home / Self Care | Payer: PPO | Attending: Ophthalmology | Admitting: Ophthalmology

## 2019-10-09 DIAGNOSIS — F329 Major depressive disorder, single episode, unspecified: Secondary | ICD-10-CM | POA: Insufficient documentation

## 2019-10-09 DIAGNOSIS — H25811 Combined forms of age-related cataract, right eye: Secondary | ICD-10-CM | POA: Diagnosis not present

## 2019-10-09 DIAGNOSIS — Z87891 Personal history of nicotine dependence: Secondary | ICD-10-CM | POA: Insufficient documentation

## 2019-10-09 DIAGNOSIS — E785 Hyperlipidemia, unspecified: Secondary | ICD-10-CM | POA: Diagnosis not present

## 2019-10-09 DIAGNOSIS — I10 Essential (primary) hypertension: Secondary | ICD-10-CM | POA: Diagnosis not present

## 2019-10-09 DIAGNOSIS — H2511 Age-related nuclear cataract, right eye: Secondary | ICD-10-CM | POA: Diagnosis not present

## 2019-10-09 DIAGNOSIS — Z853 Personal history of malignant neoplasm of breast: Secondary | ICD-10-CM | POA: Insufficient documentation

## 2019-10-09 DIAGNOSIS — E7800 Pure hypercholesterolemia, unspecified: Secondary | ICD-10-CM | POA: Insufficient documentation

## 2019-10-09 DIAGNOSIS — Z7982 Long term (current) use of aspirin: Secondary | ICD-10-CM | POA: Insufficient documentation

## 2019-10-09 DIAGNOSIS — Z79899 Other long term (current) drug therapy: Secondary | ICD-10-CM | POA: Diagnosis not present

## 2019-10-09 HISTORY — PX: CATARACT EXTRACTION W/PHACO: SHX586

## 2019-10-09 HISTORY — DX: Prediabetes: R73.03

## 2019-10-09 SURGERY — PHACOEMULSIFICATION, CATARACT, WITH IOL INSERTION
Anesthesia: Monitor Anesthesia Care | Site: Eye | Laterality: Right

## 2019-10-09 MED ORDER — BRIMONIDINE TARTRATE-TIMOLOL 0.2-0.5 % OP SOLN
OPHTHALMIC | Status: DC | PRN
  Administered 2019-10-09: 1 [drp] via OPHTHALMIC

## 2019-10-09 MED ORDER — ACETAMINOPHEN 160 MG/5ML PO SOLN: 325.0000 mg | ORAL | Status: DC | PRN

## 2019-10-09 MED ORDER — NA CHONDROIT SULF-NA HYALURON 40-17 MG/ML IO SOLN
INTRAOCULAR | Status: DC | PRN
  Administered 2019-10-09: 1 mL via INTRAOCULAR

## 2019-10-09 MED ORDER — LIDOCAINE HCL (PF) 2 % IJ SOLN
INTRAOCULAR | Status: DC | PRN
  Administered 2019-10-09: 2 mL via INTRAMUSCULAR

## 2019-10-09 MED ORDER — ARMC OPHTHALMIC DILATING DROPS
1.0000 "application " | OPHTHALMIC | Status: DC | PRN
  Administered 2019-10-09 (×3): 1 via OPHTHALMIC

## 2019-10-09 MED ORDER — MIDAZOLAM HCL 2 MG/2ML IJ SOLN
INTRAMUSCULAR | Status: DC | PRN
  Administered 2019-10-09: 2 mg via INTRAVENOUS

## 2019-10-09 MED ORDER — ACETAMINOPHEN 325 MG PO TABS: 650.0000 mg | ORAL_TABLET | Freq: Once | ORAL | Status: DC | PRN

## 2019-10-09 MED ORDER — FENTANYL CITRATE (PF) 100 MCG/2ML IJ SOLN
INTRAMUSCULAR | Status: DC | PRN
  Administered 2019-10-09: 50 ug via INTRAVENOUS

## 2019-10-09 MED ORDER — TETRACAINE HCL 0.5 % OP SOLN
1.0000 [drp] | OPHTHALMIC | Status: DC | PRN
  Administered 2019-10-09 (×3): 1 [drp] via OPHTHALMIC

## 2019-10-09 MED ORDER — ONDANSETRON HCL 4 MG/2ML IJ SOLN: 4.0000 mg | Freq: Once | INTRAMUSCULAR | Status: DC | PRN

## 2019-10-09 MED ORDER — EPINEPHRINE PF 1 MG/ML IJ SOLN
INTRAOCULAR | Status: DC | PRN
  Administered 2019-10-09: 52 mL via OPHTHALMIC

## 2019-10-09 MED ORDER — MOXIFLOXACIN HCL 0.5 % OP SOLN
OPHTHALMIC | Status: DC | PRN
  Administered 2019-10-09: 0.2 mL via OPHTHALMIC

## 2019-10-09 SURGICAL SUPPLY — 20 items
CANNULA ANT/CHMB 27GA (MISCELLANEOUS) ×6 IMPLANT
GLOVE SURG LX 8.0 MICRO (GLOVE) ×4
GLOVE SURG LX STRL 8.0 MICRO (GLOVE) ×2 IMPLANT
GLOVE SURG TRIUMPH 8.0 PF LTX (GLOVE) ×3 IMPLANT
GOWN STRL REUS W/ TWL LRG LVL3 (GOWN DISPOSABLE) ×2 IMPLANT
GOWN STRL REUS W/TWL LRG LVL3 (GOWN DISPOSABLE) ×4
LENS IOL TECNIS ITEC 22.0 (Intraocular Lens) ×3 IMPLANT
MARKER SKIN DUAL TIP RULER LAB (MISCELLANEOUS) ×3 IMPLANT
NDL RETROBULBAR .5 NSTRL (NEEDLE) ×3 IMPLANT
NEEDLE FILTER BLUNT 18X 1/2SAF (NEEDLE) ×2
NEEDLE FILTER BLUNT 18X1 1/2 (NEEDLE) ×1 IMPLANT
PACK EYE AFTER SURG (MISCELLANEOUS) ×3 IMPLANT
PACK OPTHALMIC (MISCELLANEOUS) ×3 IMPLANT
PACK PORFILIO (MISCELLANEOUS) ×3 IMPLANT
SUT ETHILON 10-0 CS-B-6CS-B-6 (SUTURE)
SUTURE EHLN 10-0 CS-B-6CS-B-6 (SUTURE) IMPLANT
SYR 3ML LL SCALE MARK (SYRINGE) ×3 IMPLANT
SYR TB 1ML LUER SLIP (SYRINGE) ×3 IMPLANT
WATER STERILE IRR 250ML POUR (IV SOLUTION) ×3 IMPLANT
WIPE NON LINTING 3.25X3.25 (MISCELLANEOUS) ×3 IMPLANT

## 2019-10-09 NOTE — Anesthesia Postprocedure Evaluation (Signed)
Anesthesia Post Note  Patient: Michelle Clark  Procedure(s) Performed: CATARACT EXTRACTION PHACO AND INTRAOCULAR LENS PLACEMENT (IOC) RIGHT DIABETIC 9.43, 00:53 (Right Eye)     Patient location during evaluation: PACU Anesthesia Type: MAC Level of consciousness: awake and alert, oriented and patient cooperative Pain management: pain level controlled Vital Signs Assessment: post-procedure vital signs reviewed and stable Respiratory status: spontaneous breathing, nonlabored ventilation and respiratory function stable Cardiovascular status: blood pressure returned to baseline and stable Postop Assessment: adequate PO intake Anesthetic complications: no    Darrin Nipper

## 2019-10-09 NOTE — Transfer of Care (Signed)
Immediate Anesthesia Transfer of Care Note  Patient: Michelle Clark  Procedure(s) Performed: CATARACT EXTRACTION PHACO AND INTRAOCULAR LENS PLACEMENT (IOC) RIGHT DIABETIC (Right Eye)  Patient Location: PACU  Anesthesia Type: MAC  Level of Consciousness: awake, alert  and patient cooperative  Airway and Oxygen Therapy: Patient Spontanous Breathing and Patient connected to supplemental oxygen  Post-op Assessment: Post-op Vital signs reviewed, Patient's Cardiovascular Status Stable, Respiratory Function Stable, Patent Airway and No signs of Nausea or vomiting  Post-op Vital Signs: Reviewed and stable  Complications: No apparent anesthesia complications

## 2019-10-09 NOTE — Op Note (Signed)
PREOPERATIVE DIAGNOSIS:  Nuclear sclerotic cataract of the right eye.   POSTOPERATIVE DIAGNOSIS:  Cataract   OPERATIVE PROCEDURE:@   SURGEON:  Birder Robson, MD.   ANESTHESIA:  Anesthesiologist: Darrin Nipper, MD CRNA: Silvana Newness, CRNA  1.      Managed anesthesia care. 2.      0.31ml of Shugarcaine was instilled in the eye following the paracentesis.   COMPLICATIONS:  None.   TECHNIQUE:   Stop and chop   DESCRIPTION OF PROCEDURE:  The patient was examined and consented in the preoperative holding area where the aforementioned topical anesthesia was applied to the right eye and then brought back to the Operating Room where the right eye was prepped and draped in the usual sterile ophthalmic fashion and a lid speculum was placed. A paracentesis was created with the side port blade and the anterior chamber was filled with viscoelastic. A near clear corneal incision was performed with the steel keratome. A continuous curvilinear capsulorrhexis was performed with a cystotome followed by the capsulorrhexis forceps. Hydrodissection and hydrodelineation were carried out with BSS on a blunt cannula. The lens was removed in a stop and chop  technique and the remaining cortical material was removed with the irrigation-aspiration handpiece. The capsular bag was inflated with viscoelastic and the Technis [id number]  lens was placed in the capsular bag without complication. The remaining viscoelastic was removed from the eye with the irrigation-aspiration handpiece. The wounds were hydrated. The anterior chamber was flushed with BSS and the eye was inflated to physiologic pressure. 0.79ml of Vigamox was placed in the anterior chamber. The wounds were found to be water tight. The eye was dressed with Combigan. The patient was given protective glasses to wear throughout the day and a shield with which to sleep tonight. The patient was also given drops with which to begin a drop regimen today and will follow-up  with me in one day. Implant Name Type Inv. Item Serial No. Manufacturer Lot No. LRB No. Used Action  LENS IOL DIOP 22.0 - [id number] Intraocular Lens LENS IOL DIOP 22.0 [id number]  Right 1 Implanted   Procedure(s): CATARACT EXTRACTION PHACO AND INTRAOCULAR LENS PLACEMENT (IOC) RIGHT DIABETIC 9.43, 00:53 (Right)  Electronically signed: Birder Robson 10/09/2019 9:23 AM

## 2019-10-09 NOTE — H&P (Signed)
All labs reviewed. Abnormal studies sent to patients PCP when indicated.  Previous H&P reviewed, patient examined, there are NO CHANGES.  Michelle Casebolt Porfilio11/17/20208:54 AM

## 2019-10-10 ENCOUNTER — Encounter: Payer: Self-pay | Admitting: Ophthalmology

## 2019-10-12 ENCOUNTER — Encounter: Payer: Self-pay | Admitting: *Deleted

## 2019-10-24 ENCOUNTER — Telehealth: Payer: Self-pay

## 2019-10-24 NOTE — Telephone Encounter (Signed)
Patient will call back to schedule her colonoscopy.  She needs to look at her schedule.  Thanks [employer]

## 2019-10-29 ENCOUNTER — Telehealth: Payer: Self-pay | Admitting: Gastroenterology

## 2019-10-29 NOTE — Telephone Encounter (Signed)
Pt left vm to schedule a colonoscopy the last week of february please return her call

## 2019-10-30 ENCOUNTER — Other Ambulatory Visit: Payer: Self-pay

## 2019-10-30 DIAGNOSIS — Z1211 Encounter for screening for malignant neoplasm of colon: Secondary | ICD-10-CM

## 2019-10-30 NOTE — Telephone Encounter (Signed)
Gastroenterology Pre-Procedure Review  Request Date: 01/14/20 Requesting Physician: Dr. Marius Ditch  PATIENT REVIEW QUESTIONS: The patient responded to the following health history questions as indicated:    1. Are you having any GI issues? no 2. Do you have a personal history of Polyps? yes (2016) 3. Do you have a family history of Colon Cancer or Polyps? no 4. Diabetes Mellitus? no 5. Joint replacements in the past 12 months?no 6. Major health problems in the past 3 months?no 7. Any artificial heart valves, MVP, or defibrillator?no    MEDICATIONS & ALLERGIES:    Patient reports the following regarding taking any anticoagulation/antiplatelet therapy:   Plavix, Coumadin, Eliquis, Xarelto, Lovenox, Pradaxa, Brilinta, or Effient? no Aspirin? yes (81 MG DAILY)  Patient confirms/reports the following medications:  Current Outpatient Medications  Medication Sig Dispense Refill  . amLODipine (NORVASC) 5 MG tablet TAKE 1 TABLET BY MOUTH ONCE A DAY 90 tablet 3  . aspirin 81 MG tablet Take 81 mg by mouth daily.    Marland Kitchen buPROPion (WELLBUTRIN XL) 300 MG 24 hr tablet Take 1 tablet (300 mg total) by mouth every morning. 90 tablet 1  . Cholecalciferol (VITAMIN D) 2000 units tablet Take 1 tablet by mouth daily.    . cyclobenzaprine (FLEXERIL) 5 MG tablet Take 5 mg by mouth 3 (three) times daily as needed for muscle spasms.    . hydrochlorothiazide (MICROZIDE) 12.5 MG capsule TAKE 1 CAPSULE BY MOUTH ONCE DAILY 90 capsule 1  . losartan (COZAAR) 100 MG tablet TAKE 1 TABLET BY MOUTH ONCE A DAY 90 tablet 3  . metoprolol succinate (TOPROL-XL) 50 MG 24 hr tablet TAKE 1 TABLET BY MOUTH ONCE A DAY IMMEDIATELY FOLLOWING A MEAL. 90 tablet 3  . Multiple Vitamin (MULTIVITAMIN) tablet Take 1 tablet by mouth daily.    . naproxen sodium (ANAPROX) 220 MG tablet Take 220 mg by mouth 2 (two) times daily with a meal.    . sertraline (ZOLOFT) 100 MG tablet TAKE 1 TABLET BY MOUTH EVERY NIGHT AT BEDTIME 90 tablet 1  .  simvastatin (ZOCOR) 20 MG tablet TAKE 1 TABLET BY MOUTH ONCE A DAY 90 tablet 3   No current facility-administered medications for this visit.     Patient confirms/reports the following allergies:  Allergies  Allergen Reactions  . Fluoxetine     drowsiness    No orders of the defined types were placed in this encounter.   AUTHORIZATION INFORMATION Primary Insurance: 1D#: Group #:  Secondary Insurance: 1D#: Group #:  SCHEDULE INFORMATION: Date: 01/14/20 Time: Location:

## 2019-11-21 ENCOUNTER — Other Ambulatory Visit: Payer: Self-pay | Admitting: Family Medicine

## 2019-12-11 ENCOUNTER — Telehealth: Payer: Self-pay

## 2019-12-11 NOTE — Telephone Encounter (Signed)
Patients colonoscopy has been rescheduled from 01/14/20 at [facility] with Dr. Marius Ditch to 01/08/20 with Dr. Marius Ditch at [facility].  [place] has been notified of the date/location change.  Pt advised of her new COVID test date 01/04/20.  New instructions will be mailed to her.  Referral updated.  Thanks [employer]

## 2019-12-24 ENCOUNTER — Other Ambulatory Visit: Payer: Self-pay

## 2019-12-24 ENCOUNTER — Ambulatory Visit
Admission: RE | Admit: 2019-12-24 | Discharge: 2019-12-24 | Disposition: A | Discharge status: Home / Self Care | Payer: PPO | Source: Ambulatory Visit | Attending: Family Medicine | Admitting: Family Medicine

## 2019-12-24 DIAGNOSIS — Z1231 Encounter for screening mammogram for malignant neoplasm of breast: Secondary | ICD-10-CM | POA: Insufficient documentation

## 2020-01-03 ENCOUNTER — Encounter: Payer: Self-pay | Admitting: Gastroenterology

## 2020-01-03 ENCOUNTER — Other Ambulatory Visit: Payer: Self-pay

## 2020-01-04 ENCOUNTER — Other Ambulatory Visit
Admission: RE | Admit: 2020-01-04 | Discharge: 2020-01-04 | Disposition: A | Discharge status: Home / Self Care | Payer: PPO | Source: Ambulatory Visit | Attending: Gastroenterology | Admitting: Gastroenterology

## 2020-01-04 DIAGNOSIS — Z01812 Encounter for preprocedural laboratory examination: Secondary | ICD-10-CM | POA: Diagnosis not present

## 2020-01-04 DIAGNOSIS — Z20822 Contact with and (suspected) exposure to covid-19: Secondary | ICD-10-CM | POA: Diagnosis not present

## 2020-01-04 NOTE — Discharge Instructions (Signed)
General Anesthesia, Adult, Care After This sheet gives you information about how to care for yourself after your procedure. Your health care provider may also give you more specific instructions. If you have problems or questions, contact your health care provider. What can I expect after the procedure? After the procedure, the following side effects are common:  Pain or discomfort at the IV site.  Nausea.  Vomiting.  Sore throat.  Trouble concentrating.  Feeling cold or chills.  Weak or tired.  Sleepiness and fatigue.  Soreness and body aches. These side effects can affect parts of the body that were not involved in surgery. Follow these instructions at home:  For at least 24 hours after the procedure:  Have a responsible adult stay with you. It is important to have someone help care for you until you are awake and alert.  Rest as needed.  Do not: ? Participate in activities in which you could fall or become injured. ? Drive. ? Use heavy machinery. ? Drink alcohol. ? Take sleeping pills or medicines that cause drowsiness. ? Make important decisions or sign legal documents. ? Take care of children on your own. Eating and drinking  Follow any instructions from your health care provider about eating or drinking restrictions.  When you feel hungry, start by eating small amounts of foods that are soft and easy to digest (bland), such as toast. Gradually return to your regular diet.  Drink enough fluid to keep your urine pale yellow.  If you vomit, rehydrate by drinking water, juice, or clear broth. General instructions  If you have sleep apnea, surgery and certain medicines can increase your risk for breathing problems. Follow instructions from your health care provider about wearing your sleep device: ? Anytime you are sleeping, including during daytime naps. ? While taking prescription pain medicines, sleeping medicines, or medicines that make you drowsy.  Return to  your normal activities as told by your health care provider. Ask your health care provider what activities are safe for you.  Take over-the-counter and prescription medicines only as told by your health care provider.  If you smoke, do not smoke without supervision.  Keep all follow-up visits as told by your health care provider. This is important. Contact a health care provider if:  You have nausea or vomiting that does not get better with medicine.  You cannot eat or drink without vomiting.  You have pain that does not get better with medicine.  You are unable to pass urine.  You develop a skin rash.  You have a fever.  You have redness around your IV site that gets worse. Get help right away if:  You have difficulty breathing.  You have chest pain.  You have blood in your urine or stool, or you vomit blood. Summary  After the procedure, it is common to have a sore throat or nausea. It is also common to feel tired.  Have a responsible adult stay with you for the first 24 hours after general anesthesia. It is important to have someone help care for you until you are awake and alert.  When you feel hungry, start by eating small amounts of foods that are soft and easy to digest (bland), such as toast. Gradually return to your regular diet.  Drink enough fluid to keep your urine pale yellow.  Return to your normal activities as told by your health care provider. Ask your health care provider what activities are safe for you. This information is not   intended to replace advice given to you by your health care provider. Make sure you discuss any questions you have with your health care provider. Document Revised: 11/11/2017 Document Reviewed: 06/24/2017 Elsevier Patient Education  2020 Elsevier Inc.  

## 2020-01-05 LAB — SARS CORONAVIRUS 2 (TAT 6-24 HRS): SARS Coronavirus 2: NEGATIVE

## 2020-01-08 ENCOUNTER — Ambulatory Visit: Payer: PPO | Admitting: Anesthesiology

## 2020-01-08 ENCOUNTER — Encounter: Payer: Self-pay | Admitting: Gastroenterology

## 2020-01-08 ENCOUNTER — Other Ambulatory Visit: Payer: Self-pay

## 2020-01-08 ENCOUNTER — Ambulatory Visit
Admission: RE | Admit: 2020-01-08 | Discharge: 2020-01-08 | Disposition: A | Discharge status: Home / Self Care | Payer: PPO | Attending: Gastroenterology | Admitting: Gastroenterology

## 2020-01-08 ENCOUNTER — Encounter
Admission: RE | Disposition: A | Discharge status: Home / Self Care | Payer: Self-pay | Source: Home / Self Care | Attending: Gastroenterology

## 2020-01-08 DIAGNOSIS — Z9221 Personal history of antineoplastic chemotherapy: Secondary | ICD-10-CM | POA: Insufficient documentation

## 2020-01-08 DIAGNOSIS — Z87891 Personal history of nicotine dependence: Secondary | ICD-10-CM | POA: Diagnosis not present

## 2020-01-08 DIAGNOSIS — Z79899 Other long term (current) drug therapy: Secondary | ICD-10-CM | POA: Insufficient documentation

## 2020-01-08 DIAGNOSIS — K6389 Other specified diseases of intestine: Secondary | ICD-10-CM | POA: Diagnosis not present

## 2020-01-08 DIAGNOSIS — K644 Residual hemorrhoidal skin tags: Secondary | ICD-10-CM | POA: Insufficient documentation

## 2020-01-08 DIAGNOSIS — K635 Polyp of colon: Secondary | ICD-10-CM | POA: Diagnosis not present

## 2020-01-08 DIAGNOSIS — Z860101 Personal history of adenomatous and serrated colon polyps: Secondary | ICD-10-CM

## 2020-01-08 DIAGNOSIS — Z09 Encounter for follow-up examination after completed treatment for conditions other than malignant neoplasm: Secondary | ICD-10-CM | POA: Diagnosis present

## 2020-01-08 DIAGNOSIS — E785 Hyperlipidemia, unspecified: Secondary | ICD-10-CM | POA: Diagnosis not present

## 2020-01-08 DIAGNOSIS — Z923 Personal history of irradiation: Secondary | ICD-10-CM | POA: Diagnosis not present

## 2020-01-08 DIAGNOSIS — D124 Benign neoplasm of descending colon: Secondary | ICD-10-CM | POA: Insufficient documentation

## 2020-01-08 DIAGNOSIS — I10 Essential (primary) hypertension: Secondary | ICD-10-CM | POA: Diagnosis not present

## 2020-01-08 DIAGNOSIS — Z7982 Long term (current) use of aspirin: Secondary | ICD-10-CM | POA: Diagnosis not present

## 2020-01-08 DIAGNOSIS — Z86010 Personal history of colonic polyps: Secondary | ICD-10-CM | POA: Diagnosis not present

## 2020-01-08 DIAGNOSIS — Z853 Personal history of malignant neoplasm of breast: Secondary | ICD-10-CM | POA: Insufficient documentation

## 2020-01-08 DIAGNOSIS — K5730 Diverticulosis of large intestine without perforation or abscess without bleeding: Secondary | ICD-10-CM

## 2020-01-08 HISTORY — PX: COLONOSCOPY WITH PROPOFOL: SHX5780

## 2020-01-08 HISTORY — PX: POLYPECTOMY: SHX5525

## 2020-01-08 SURGERY — COLONOSCOPY WITH PROPOFOL
Anesthesia: General | Site: Rectum

## 2020-01-08 MED ORDER — ONDANSETRON HCL 4 MG/2ML IJ SOLN: 4.0000 mg | Freq: Once | INTRAMUSCULAR | Status: DC | PRN

## 2020-01-08 MED ORDER — PROPOFOL 10 MG/ML IV BOLUS
INTRAVENOUS | Status: DC | PRN
  Administered 2020-01-08: 80 mg via INTRAVENOUS
  Administered 2020-01-08: 40 mg via INTRAVENOUS
  Administered 2020-01-08 (×3): 20 mg via INTRAVENOUS
  Administered 2020-01-08: 30 mg via INTRAVENOUS
  Administered 2020-01-08 (×2): 20 mg via INTRAVENOUS
  Administered 2020-01-08: 30 mg via INTRAVENOUS
  Administered 2020-01-08 (×3): 20 mg via INTRAVENOUS
  Administered 2020-01-08: 40 mg via INTRAVENOUS
  Administered 2020-01-08: 20 mg via INTRAVENOUS

## 2020-01-08 MED ORDER — STERILE WATER FOR IRRIGATION IR SOLN
Status: DC | PRN
  Administered 2020-01-08: 100 mL

## 2020-01-08 MED ORDER — LACTATED RINGERS IV SOLN: INTRAVENOUS | Status: DC

## 2020-01-08 MED ORDER — LIDOCAINE HCL (CARDIAC) PF 100 MG/5ML IV SOSY
PREFILLED_SYRINGE | INTRAVENOUS | Status: DC | PRN
  Administered 2020-01-08: 50 mg via INTRAVENOUS

## 2020-01-08 SURGICAL SUPPLY — 25 items
CANISTER SUCT 1200ML W/VALVE (MISCELLANEOUS) ×4 IMPLANT
CLIP HMST 235XBRD CATH ROT (MISCELLANEOUS) IMPLANT
CLIP RESOLUTION 360 11X235 (MISCELLANEOUS)
ELECT REM PT RETURN 9FT ADLT (ELECTROSURGICAL)
ELECTRODE REM PT RTRN 9FT ADLT (ELECTROSURGICAL) IMPLANT
FCP ESCP3.2XJMB 240X2.8X (MISCELLANEOUS) ×2
FORCEPS BIOP RAD 4 LRG CAP 4 (CUTTING FORCEPS) ×4 IMPLANT
FORCEPS BIOP RJ4 240 W/NDL (MISCELLANEOUS) ×2
FORCEPS ESCP3.2XJMB 240X2.8X (MISCELLANEOUS) ×2 IMPLANT
GOWN CVR UNV OPN BCK APRN NK (MISCELLANEOUS) ×4 IMPLANT
GOWN ISOL THUMB LOOP REG UNIV (MISCELLANEOUS) ×4
INJECTOR VARIJECT VIN23 (MISCELLANEOUS) IMPLANT
KIT DEFENDO VALVE AND CONN (KITS) IMPLANT
KIT ENDO PROCEDURE OLY (KITS) ×4 IMPLANT
MARKER SPOT ENDO TATTOO 5ML (MISCELLANEOUS) IMPLANT
PROBE APC STR FIRE (PROBE) IMPLANT
RETRIEVER NET ROTH 2.5X230 LF (MISCELLANEOUS) ×4 IMPLANT
SNARE COLD EXACTO (MISCELLANEOUS) ×4 IMPLANT
SNARE SHORT THROW 13M SML OVAL (MISCELLANEOUS) IMPLANT
SNARE SHORT THROW 30M LRG OVAL (MISCELLANEOUS) IMPLANT
SNARE SNG USE RND 15MM (INSTRUMENTS) IMPLANT
SPOT EX ENDOSCOPIC TATTOO (MISCELLANEOUS)
TRAP ETRAP POLY (MISCELLANEOUS) IMPLANT
VARIJECT INJECTOR VIN23 (MISCELLANEOUS)
WATER STERILE IRR 250ML POUR (IV SOLUTION) ×4 IMPLANT

## 2020-01-08 NOTE — Anesthesia Procedure Notes (Signed)
Performed by: Britanni Yarde, CRNA Pre-anesthesia Checklist: Patient identified, Emergency Drugs available, Suction available, Timeout performed and Patient being monitored Patient Re-evaluated:Patient Re-evaluated prior to induction Oxygen Delivery Method: Nasal cannula Placement Confirmation: positive ETCO2       

## 2020-01-08 NOTE — Transfer of Care (Signed)
Immediate Anesthesia Transfer of Care Note  Patient: Michelle Clark  Procedure(s) Performed: COLONOSCOPY WITH PROPOFOL (N/A Rectum) POLYPECTOMY (Rectum)  Patient Location: PACU  Anesthesia Type: General  Level of Consciousness: awake, alert  and patient cooperative  Airway and Oxygen Therapy: Patient Spontanous Breathing and Patient connected to supplemental oxygen  Post-op Assessment: Post-op Vital signs reviewed, Patient's Cardiovascular Status Stable, Respiratory Function Stable, Patent Airway and No signs of Nausea or vomiting  Post-op Vital Signs: Reviewed and stable  Complications: No apparent anesthesia complications

## 2020-01-08 NOTE — Op Note (Signed)
[facility] Gastroenterology Patient Name: Michelle Clark Procedure Date: 01/08/2020 11:55 AM MRN: [id number] Account #: [id number] Date of Birth: 1946/09/17 Admit Type: Outpatient Age: 74 Room: [facility] OR ROOM 01 Gender: Female Note Status: Finalized Procedure:             Colonoscopy Indications:           Surveillance: Personal history of adenomatous polyps                         on last colonoscopy 5 years ago, Last colonoscopy:                         April 2016 Providers:             Lin Landsman MD, MD Referring MD:          Yvetta Coder. Arnett (Referring MD) Medicines:             Monitored Anesthesia Care Complications:         No immediate complications. Estimated blood loss: None. Procedure:             Pre-Anesthesia Assessment:                        - Prior to the procedure, a History and Physical was                         performed, and patient medications and allergies were                         reviewed. The patient is competent. The risks and                         benefits of the procedure and the sedation options and                         risks were discussed with the patient. All questions                         were answered and informed consent was obtained.                         Patient identification and proposed procedure were                         verified by the physician, the nurse, the                         anesthesiologist, the anesthetist and the technician                         in the pre-procedure area in the procedure room in the                         endoscopy suite. Mental Status Examination: alert and                         oriented. Airway Examination: normal oropharyngeal  airway and neck mobility. Respiratory Examination:                         clear to auscultation. CV Examination: normal.                         Prophylactic Antibiotics: The patient does not require             prophylactic antibiotics. Prior Anticoagulants: The                         patient has taken no previous anticoagulant or                         antiplatelet agents. ASA Grade Assessment: II - A                         patient with mild systemic disease. After reviewing                         the risks and benefits, the patient was deemed in                         satisfactory condition to undergo the procedure. The                         anesthesia plan was to use monitored anesthesia care                         (MAC). Immediately prior to administration of                         medications, the patient was re-assessed for adequacy                         to receive sedatives. The heart rate, respiratory                         rate, oxygen saturations, blood pressure, adequacy of                         pulmonary ventilation, and response to care were                         monitored throughout the procedure. The physical                         status of the patient was re-assessed after the                         procedure.                        After obtaining informed consent, the colonoscope was                         passed under direct vision. Throughout the procedure,  the patient's blood pressure, pulse, and oxygen                         saturations were monitored continuously. The                         Colonoscope was introduced through the anus and                         advanced to the the cecum, identified by appendiceal                         orifice and ileocecal valve. The colonoscopy was                         performed without difficulty. The patient tolerated                         the procedure well. The quality of the bowel                         preparation was evaluated using the BBPS [facility] Bowel                         Preparation Scale) with scores of: Right Colon = 3,                         Transverse Colon  = 3 and Left Colon = 3 (entire mucosa                         seen well with no residual staining, small fragments                         of stool or opaque liquid). The total BBPS score                         equals 9. Findings:      The perianal and digital rectal examinations were normal. Pertinent       negatives include normal sphincter tone and no palpable rectal lesions.      Three sessile polyps were found in the descending colon and cecum. The       polyps were 3 to 4 mm in size. These polyps were removed with a cold       snare. Resection and retrieval were complete.      Multiple diverticula were found in the sigmoid colon.      Non-bleeding external hemorrhoids were found during retroflexion. The       hemorrhoids were large. Impression:            - Three 3 to 4 mm polyps in the descending colon and                         in the cecum, removed with a cold snare. Resected and                         retrieved.                        -  Diverticulosis in the sigmoid colon.                        - Non-bleeding external hemorrhoids. Recommendation:        - Repeat colonoscopy in 5 years for surveillance.                        - Discharge patient to home (with escort).                        - Resume previous diet today.                        - Continue present medications.                        - Await pathology results. Procedure Code(s):     --- Professional ---                        [phone], Colonoscopy, flexible; with removal of                         tumor(s), polyp(s), or other lesion(s) by snare                         technique Diagnosis Code(s):     --- Professional ---                        Z86.010, Personal history of colonic polyps                        K63.5, Polyp of colon                        K64.4, Residual hemorrhoidal skin tags                        K57.30, Diverticulosis of large intestine without                         perforation or abscess  without bleeding CPT copyright 2019 American Medical Association. All rights reserved. The codes documented in this report are preliminary and upon coder review may  be revised to meet current compliance requirements. Dr. Ulyess Mort Lin Landsman MD, MD 01/08/2020 12:28:46 PM This report has been signed electronically. Number of Addenda: 0 Note Initiated On: 01/08/2020 11:55 AM Scope Withdrawal Time: 0 hours 14 minutes 31 seconds  Total Procedure Duration: 0 hours 17 minutes 37 seconds  Estimated Blood Loss:  Estimated blood loss: none.      [facility]

## 2020-01-08 NOTE — Anesthesia Postprocedure Evaluation (Signed)
Anesthesia Post Note  Patient: Michelle Clark  Procedure(s) Performed: COLONOSCOPY WITH PROPOFOL (N/A Rectum) POLYPECTOMY (Rectum)     Patient location during evaluation: PACU Anesthesia Type: General Level of consciousness: awake and alert Pain management: pain level controlled Vital Signs Assessment: post-procedure vital signs reviewed and stable Respiratory status: spontaneous breathing, nonlabored ventilation, respiratory function stable and patient connected to nasal cannula oxygen Cardiovascular status: blood pressure returned to baseline and stable Postop Assessment: no apparent nausea or vomiting Anesthetic complications: no    Alisa Graff

## 2020-01-08 NOTE — Anesthesia Preprocedure Evaluation (Signed)
Anesthesia Evaluation  Patient identified by MRN, date of birth, ID band Patient awake    Reviewed: Allergy & Precautions, H&P , NPO status , Patient's Chart, lab work & pertinent test results, reviewed documented beta blocker date and time   Airway Mallampati: II  TM Distance: >3 FB Neck ROM: full    Dental no notable dental hx.    Pulmonary neg pulmonary ROS, former smoker,    Pulmonary exam normal breath sounds clear to auscultation       Cardiovascular Exercise Tolerance: Good hypertension,  Rhythm:regular Rate:Normal     Neuro/Psych negative neurological ROS  negative psych ROS   GI/Hepatic negative GI ROS, Neg liver ROS,   Endo/Other  diabetes (preDM)  Renal/GU negative Renal ROS   H/o R breast ca, s/p chemo/XRT/mastectomt negative genitourinary   Musculoskeletal   Abdominal   Peds  Hematology negative hematology ROS (+)   Anesthesia Other Findings   Reproductive/Obstetrics negative OB ROS                             Anesthesia Physical Anesthesia Plan  ASA: II  Anesthesia Plan: General   Post-op Pain Management:    Induction:   PONV Risk Score and Plan: 3 and Propofol infusion, TIVA and Treatment may vary due to age or medical condition  Airway Management Planned:   Additional Equipment:   Intra-op Plan:   Post-operative Plan:   Informed Consent: I have reviewed the patients History and Physical, chart, labs and discussed the procedure including the risks, benefits and alternatives for the proposed anesthesia with the patient or authorized representative who has indicated his/her understanding and acceptance.     Dental Advisory Given  Plan Discussed with: CRNA  Anesthesia Plan Comments:         Anesthesia Quick Evaluation

## 2020-01-08 NOTE — H&P (Signed)
Cephas Darby, MD [address]  [place]  [place], [place] [address]  Main: [phone]  Fax: [phone] Pager: [phone]  Primary Care Physician:  Burnard Hawthorne, FNP Primary Gastroenterologist:  Dr. Cephas Darby  Pre-Procedure History & Physical: HPI:  Michelle Clark is a 74 y.o. female is here for an colonoscopy.   Past Medical History:  Diagnosis Date  . Cancer [facility]) 2001   Right breast, s/p chemotherapy, XRT and mastectomy, Dr. Jeb Levering  . Colon polyps   . Hyperlipidemia   . Hypertension   . Personal history of chemotherapy   . Personal history of radiation therapy   . Pre-diabetes     Past Surgical History:  Procedure Laterality Date  . ABDOMINAL HYSTERECTOMY  1998   cervix intact  . APPENDECTOMY    . BREAST SURGERY  2002   mastectomy  . CATARACT EXTRACTION W/PHACO Right 10/09/2019   Procedure: CATARACT EXTRACTION PHACO AND INTRAOCULAR LENS PLACEMENT (IOC) RIGHT DIABETIC 9.43, 00:53;  Surgeon: Birder Robson, MD;  Location: [place];  Service: Ophthalmology;  Laterality: Right;  . CHOLECYSTECTOMY    . MASTECTOMY Right 2001   chemo, rad  . OOPHORECTOMY    . TUBAL LIGATION      Prior to Admission medications   Medication Sig Start Date End Date Taking? Authorizing Provider  amLODipine (NORVASC) 5 MG tablet TAKE 1 TABLET BY MOUTH ONCE A DAY 02/06/19  Yes Burnard Hawthorne, FNP  aspirin 81 MG tablet Take 81 mg by mouth daily.   Yes [provider]  buPROPion (WELLBUTRIN XL) 300 MG 24 hr tablet Take 1 tablet (300 mg total) by mouth every morning. 09/28/19  Yes Guse, Jacquelynn Cree, FNP  Cholecalciferol (VITAMIN D) 2000 units tablet Take 1 tablet by mouth daily.   Yes [provider]  cyclobenzaprine (FLEXERIL) 5 MG tablet TAKE 1 TABLET BY MOUTH EVERY NIGHT AT BEDTIME AS NEEDED FOR MUSCLE SPASMS 11/21/19  Yes Burnard Hawthorne, FNP  hydrochlorothiazide (MICROZIDE) 12.5 MG capsule TAKE 1 CAPSULE BY MOUTH ONCE DAILY 07/18/19   Yes Burnard Hawthorne, FNP  losartan (COZAAR) 100 MG tablet TAKE 1 TABLET BY MOUTH ONCE A DAY 05/23/19  Yes Leone Haven, MD  metoprolol succinate (TOPROL-XL) 50 MG 24 hr tablet TAKE 1 TABLET BY MOUTH ONCE A DAY IMMEDIATELY FOLLOWING A MEAL. 08/03/19  Yes Burnard Hawthorne, FNP  Multiple Vitamin (MULTIVITAMIN) tablet Take 1 tablet by mouth daily.   Yes [provider]  naproxen sodium (ANAPROX) 220 MG tablet Take 220 mg by mouth 2 (two) times daily with a meal.   Yes [provider]  sertraline (ZOLOFT) 100 MG tablet TAKE 1 TABLET BY MOUTH EVERY NIGHT AT BEDTIME 10/04/19  Yes Burnard Hawthorne, FNP  simvastatin (ZOCOR) 20 MG tablet TAKE 1 TABLET BY MOUTH ONCE A DAY 02/06/19  Yes Burnard Hawthorne, FNP    Allergies as of 10/30/2019 - Review Complete 10/09/2019  Allergen Reaction Noted  . Fluoxetine  03/03/2015    Family History  Problem Relation Age of Onset  . Arthritis Mother   . Stroke Mother 55  . Arthritis Sister   . Stroke Sister 68       paralyzed left side  . Breast cancer Sister 7  . Cancer Sister        breast  . Heart disease Brother   . Asthma Brother   . Breast cancer Other   . Breast cancer Cousin  maternal - 30's    Social History   Socioeconomic History  . Marital status: Married    Spouse name: Not on file  . Number of children: Not on file  . Years of education: Not on file  . Highest education level: Not on file  Occupational History  . Not on file  Tobacco Use  . Smoking status: Former Smoker    Quit date: 04/10/1997    Years since quitting: 22.7  . Smokeless tobacco: Never Used  Substance and Sexual Activity  . Alcohol use: Not Currently    Alcohol/week: 1.0 standard drinks    Types: 1 Glasses of wine per week  . Drug use: No  . Sexual activity: Not Currently  Other Topics Concern  . Not on file  Social History Narrative   Lives in [place] with husband. No pets      Work - retired, Surveyor, minerals       Diet - regular      Exercise - none   Social Determinants of Radio broadcast assistant Strain:   . Difficulty of Paying Living Expenses: Not on file  Food Insecurity:   . Worried About Charity fundraiser in the Last Year: Not on file  . Ran Out of Food in the Last Year: Not on file  Transportation Needs:   . Lack of Transportation (Medical): Not on file  . Lack of Transportation (Non-Medical): Not on file  Physical Activity: Insufficiently Active  . Days of Exercise per Week: 2 days  . Minutes of Exercise per Session: 20 min  Stress:   . Feeling of Stress : Not on file  Social Connections:   . Frequency of Communication with Friends and Family: Not on file  . Frequency of Social Gatherings with Friends and Family: Not on file  . Attends Religious Services: Not on file  . Active Member of Clubs or Organizations: Not on file  . Attends Archivist Meetings: Not on file  . Marital Status: Not on file  Intimate Partner Violence:   . Fear of Current or Ex-Partner: Not on file  . Emotionally Abused: Not on file  . Physically Abused: Not on file  . Sexually Abused: Not on file    Review of Systems: See HPI, otherwise negative ROS  Physical Exam: BP 140/73   Pulse 86   Temp 97.7 F (36.5 C) (Temporal)   Ht 5\' 5"  (1.651 m)   Wt 88.9 kg   SpO2 100%   BMI 32.62 kg/m  General:   Alert,  pleasant and cooperative in NAD Head:  Normocephalic and atraumatic. Neck:  Supple; no masses or thyromegaly. Lungs:  Clear throughout to auscultation.    Heart:  Regular rate and rhythm. Abdomen:  Soft, nontender and nondistended. Normal bowel sounds, without guarding, and without rebound.   Neurologic:  Alert and  oriented x4;  grossly normal neurologically.  Impression/Plan: Michelle Clark is here for an colonoscopy to be performed for h/o adenoma colon  Risks, benefits, limitations, and alternatives regarding  colonoscopy have been reviewed with the patient.  Questions  have been answered.  All parties agreeable.   Sherri Sear, MD  01/08/2020, 11:30 AM

## 2020-01-09 ENCOUNTER — Encounter: Payer: Self-pay | Admitting: *Deleted

## 2020-01-11 ENCOUNTER — Encounter: Payer: Self-pay | Admitting: Gastroenterology

## 2020-01-11 LAB — SURGICAL PATHOLOGY

## 2020-01-19 DIAGNOSIS — Z23 Encounter for immunization: Secondary | ICD-10-CM | POA: Diagnosis not present

## 2020-01-28 ENCOUNTER — Other Ambulatory Visit: Payer: Self-pay | Admitting: Family

## 2020-01-28 DIAGNOSIS — I10 Essential (primary) hypertension: Secondary | ICD-10-CM

## 2020-03-03 ENCOUNTER — Other Ambulatory Visit: Payer: Self-pay | Admitting: Family

## 2020-04-09 ENCOUNTER — Other Ambulatory Visit: Payer: Self-pay | Admitting: Family

## 2020-04-09 DIAGNOSIS — I10 Essential (primary) hypertension: Secondary | ICD-10-CM

## 2020-05-05 DIAGNOSIS — H2511 Age-related nuclear cataract, right eye: Secondary | ICD-10-CM | POA: Diagnosis not present

## 2020-06-03 ENCOUNTER — Other Ambulatory Visit: Payer: Self-pay | Admitting: Family Medicine

## 2020-07-21 ENCOUNTER — Other Ambulatory Visit: Payer: Self-pay | Admitting: Family

## 2020-07-21 DIAGNOSIS — I10 Essential (primary) hypertension: Secondary | ICD-10-CM

## 2020-09-22 ENCOUNTER — Other Ambulatory Visit: Payer: Self-pay | Admitting: Family

## 2020-09-22 DIAGNOSIS — F339 Major depressive disorder, recurrent, unspecified: Secondary | ICD-10-CM

## 2020-09-22 NOTE — Telephone Encounter (Signed)
Last OV 09/28/19 but has appointment scheduled 10/05/20 ok to fill?

## 2020-09-29 ENCOUNTER — Ambulatory Visit (INDEPENDENT_AMBULATORY_CARE_PROVIDER_SITE_OTHER): Payer: PPO

## 2020-09-29 VITALS — BP 129/95 | Temp 98.0°F | Ht 65.0 in | Wt 210.0 lb

## 2020-09-29 DIAGNOSIS — Z0000 Encounter for general adult medical examination without abnormal findings: Secondary | ICD-10-CM

## 2020-09-29 NOTE — Progress Notes (Addendum)
Subjective:   Michelle Clark is a 74 y.o. female who presents for Medicare Annual (Subsequent) preventive examination.  Review of Systems    No ROS.  Medicare Wellness Virtual Visit.     Cardiac Risk Factors include: advanced age (>51men, >10 women);hypertension     Objective:    Today's Vitals   09/29/20 0840  BP: (!) 129/95  Temp: 98 F (36.7 C)  Weight: 210 lb (95.3 kg)  Height: 5\' 5"  (1.651 m)   Body mass index is 34.95 kg/m.  Advanced Directives 09/29/2020 01/08/2020 10/09/2019 09/28/2019 09/25/2018 09/15/2017  Does Patient Have a Medical Advance Directive? Yes Yes Yes Yes Yes Yes  Type of Paramedic of [place];Living will Living will Living will [place];Living will Living will Living will  Does patient want to make changes to medical advance directive? No - Patient declined No - Patient declined No - Patient declined No - Patient declined - No - Patient declined  Copy of [place] in Chart? Yes - validated most recent copy scanned in chart (See row information) - Yes - validated most recent copy scanned in chart (See row information) Yes - validated most recent copy scanned in chart (See row information) - -    Current Medications (verified) Outpatient Encounter Medications as of 09/29/2020  Medication Sig  . amLODipine (NORVASC) 5 MG tablet TAKE 1 TABLET BY MOUTH ONCE DAILY  . aspirin 81 MG tablet Take 81 mg by mouth daily.  Marland Kitchen buPROPion (WELLBUTRIN XL) 300 MG 24 hr tablet TAKE 1 TABLET BY MOUTH EVERY MORNING  . Cholecalciferol (VITAMIN D) 2000 units tablet Take 1 tablet by mouth daily.  . cyclobenzaprine (FLEXERIL) 5 MG tablet TAKE 1 TABLET BY MOUTH EVERY NIGHT AT BEDTIME AS NEEDED FOR MUSCLE SPASMS  . hydrochlorothiazide (MICROZIDE) 12.5 MG capsule TAKE 1 CAPSULE BY MOUTH ONCE DAILY  . losartan (COZAAR) 100 MG tablet TAKE 1 TABLET BY MOUTH ONCE DAILY  . metoprolol succinate (TOPROL-XL) 50 MG 24 hr  tablet TAKE 1 TABLET BY MOUTH ONCE A DAY IMMEDIATELY FOLLOWING A MEAL  . Multiple Vitamin (MULTIVITAMIN) tablet Take 1 tablet by mouth daily.  . naproxen sodium (ANAPROX) 220 MG tablet Take 220 mg by mouth 2 (two) times daily with a meal.  . sertraline (ZOLOFT) 100 MG tablet TAKE 1 TABLET BY MOUTH EVERY NIGHT AT BEDTIME  . simvastatin (ZOCOR) 20 MG tablet TAKE 1 TABLET BY MOUTH ONCE DAILY   No facility-administered encounter medications on file as of 09/29/2020.    Allergies (verified) Fluoxetine   History: Past Medical History:  Diagnosis Date  . Cancer [facility]) 2001   Right breast, s/p chemotherapy, XRT and mastectomy, Dr. Jeb Levering  . Colon polyps   . Hyperlipidemia   . Hypertension   . Personal history of chemotherapy   . Personal history of radiation therapy   . Pre-diabetes    Past Surgical History:  Procedure Laterality Date  . ABDOMINAL HYSTERECTOMY  1998   cervix intact  . APPENDECTOMY    . BREAST SURGERY  2002   mastectomy  . CATARACT EXTRACTION W/PHACO Right 10/09/2019   Procedure: CATARACT EXTRACTION PHACO AND INTRAOCULAR LENS PLACEMENT (IOC) RIGHT DIABETIC 9.43, 00:53;  Surgeon: Birder Robson, MD;  Location: [facility];  Service: Ophthalmology;  Laterality: Right;  . CHOLECYSTECTOMY    . COLONOSCOPY WITH PROPOFOL N/A 01/08/2020   Procedure: COLONOSCOPY WITH PROPOFOL;  Surgeon: Lin Landsman, MD;  Location: [facility];  Service: Gastroenterology;  Laterality: N/A;  . MASTECTOMY Right 2001   chemo, rad  . OOPHORECTOMY    . POLYPECTOMY  01/08/2020   Procedure: POLYPECTOMY;  Surgeon: Lin Landsman, MD;  Location: [facility];  Service: Gastroenterology;;  . TUBAL LIGATION     Family History  Problem Relation Age of Onset  . Arthritis Mother   . Stroke Mother 35  . Arthritis Sister   . Stroke Sister 68       paralyzed left side  . Breast cancer Sister 58  . Cancer Sister        breast  . Heart disease Brother   . Asthma  Brother   . Lung cancer Son   . Breast cancer Other   . Breast cancer Cousin        maternal - 23's   Social History   Socioeconomic History  . Marital status: Married    Spouse name: Not on file  . Number of children: Not on file  . Years of education: Not on file  . Highest education level: Not on file  Occupational History  . Not on file  Tobacco Use  . Smoking status: Former Smoker    Quit date: 04/10/1997    Years since quitting: 23.4  . Smokeless tobacco: Never Used  Vaping Use  . Vaping Use: Never used  Substance and Sexual Activity  . Alcohol use: Not Currently    Alcohol/week: 1.0 standard drink    Types: 1 Glasses of wine per week  . Drug use: No  . Sexual activity: Not Currently  Other Topics Concern  . Not on file  Social History Narrative   Lives in [place] with husband. No pets      Work - retired, Surveyor, minerals      Diet - regular      Exercise - none   Social Determinants of Radio broadcast assistant Strain: [place]   . Difficulty of Paying Living Expenses: Not hard at all  Food Insecurity: No Food Insecurity  . Worried About Charity fundraiser in the Last Year: Never true  . Ran Out of Food in the Last Year: Never true  Transportation Needs: No Transportation Needs  . Lack of Transportation (Medical): No  . Lack of Transportation (Non-Medical): No  Physical Activity: Insufficiently Active  . Days of Exercise per Week: 2 days  . Minutes of Exercise per Session: 20 min  Stress: No Stress Concern Present  . Feeling of Stress : Not at all  Social Connections: Unknown  . Frequency of Communication with Friends and Family: More than three times a week  . Frequency of Social Gatherings with Friends and Family: More than three times a week  . Attends Religious Services: Not on file  . Active Member of Clubs or Organizations: Not on file  . Attends Archivist Meetings: Not on file  . Marital Status: Married    Tobacco  Counseling Counseling given: Not Answered   Clinical Intake:  Pre-visit preparation completed: Yes        Diabetes: No  How often do you need to have someone help you when you read instructions, pamphlets, or other written materials from your doctor or pharmacy?: 1 - Never   Interpreter Needed?: No      Activities of Daily Living In your present state of health, do you have any difficulty performing the following activities: 09/29/2020 01/08/2020  Hearing? N N  Vision? N N  Difficulty concentrating  or making decisions? N N  Walking or climbing stairs? N N  Dressing or bathing? N N  Doing errands, shopping? N -  Preparing Food and eating ? N -  Using the Toilet? N -  In the past six months, have you accidently leaked urine? N -  Do you have problems with loss of bowel control? N -  Managing your Medications? N -  Managing your Finances? N -  Housekeeping or managing your Housekeeping? N -  Some recent data might be hidden    Patient Care Team: Burnard Hawthorne, FNP as PCP - General (Family Medicine)  Indicate any recent Medical Services you may have received from other than [facility] providers in the past year (date may be approximate).     Assessment:   This is a routine wellness examination for [place].  I connected with Wynter today by telephone and verified that I am speaking with the correct person using two identifiers. Location patient: home Location provider: work Persons participating in the virtual visit: patient, Marine scientist.    I discussed the limitations, risks, security and privacy concerns of performing an evaluation and management service by telephone and the availability of in person appointments. The patient expressed understanding and verbally consented to this telephonic visit.    Interactive audio and video telecommunications were attempted between this provider and patient, however failed, due to patient having technical difficulties OR patient did not  have access to video capability.  We continued and completed visit with audio only.  Some vital signs may be absent or patient reported.   Hearing/Vision screen  Hearing Screening   125Hz  250Hz  500Hz  1000Hz  2000Hz  3000Hz  4000Hz  6000Hz  8000Hz   Right ear:           Left ear:           Comments: Patient is able to hear conversational tones without difficulty.  No issues reported.  Vision Screening Comments: Followed by [facility]  Wears corrective lenses  Cataract extracted, R eye only They have regular follow up with the ophthalmologist.  Dietary issues and exercise activities discussed: Current Exercise Habits: Home exercise routine, Type of exercise: walking, Time (Minutes): 20, Frequency (Times/Week): 2, Weekly Exercise (Minutes/Week): 40, Intensity: Mild  Healthy diet Good water intake  Goals      Patient Stated   .  Low carb diet (pt-stated)      Monitor sugar intake      Depression Screen PHQ 2/9 Scores 09/29/2020 09/28/2019 05/09/2019 11/06/2018 09/25/2018 09/15/2017 03/16/2017  PHQ - 2 Score 0 0 4 0 0 1 0  PHQ- 9 Score - - 6 0 - 2 -    Fall Risk Fall Risk  09/29/2020 09/28/2019 09/25/2018 09/15/2017 03/16/2017  Falls in the past year? 0 0 0 No No  Number falls in past yr: 0 - - - -  Injury with Fall? 0 - - - -  Follow up Falls evaluation completed - - - -   Handrails in use when climbing stairs? Yes Home free of loose throw rugs in walkways, pet beds, electrical cords, etc? Yes  Adequate lighting in your home to reduce risk of falls? Yes   ASSISTIVE DEVICES UTILIZED TO PREVENT FALLS: Life alert? No  Use of a cane, walker or w/c? No   TIMED UP AND GO: Was the test performed? No .   Cognitive Function: MMSE - Mini Mental State Exam 09/15/2017  Orientation to time 5  Orientation to Place 5  Registration 3  Attention/ Calculation 5  Recall 3  Language- name 2 objects 2  Language- repeat 1  Language- follow 3 step command 3  Language- read & follow  direction 1  Write a sentence 1  Copy design 1  Total score 30     6CIT Screen 09/29/2020 09/28/2019 09/25/2018  What Year? 0 points 0 points 0 points  What month? 0 points 0 points 0 points  What time? 0 points 0 points 0 points  Count back from 20 - 0 points 0 points  Months in reverse 0 points 0 points 0 points  Repeat phrase 0 points 0 points 0 points  Total Score - 0 0    Immunizations Immunization History  Administered Date(s) Administered  . Fluad Quad(high Dose 65+) 08/01/2019  . Influenza, High Dose Seasonal PF 09/15/2016, 09/15/2017, 09/25/2018  . Influenza,inj,Quad PF,6+ Mos 10/15/2013, 09/02/2014, 09/16/2015  . Influenza-Unspecified 09/10/2012  . Moderna SARS-COVID-2 Vaccination 01/19/2020, 02/16/2020  . Pneumococcal Conjugate-13 04/19/2014  . Pneumococcal Polysaccharide-23 04/10/2013  . Tdap 09/15/2016  . Zoster 04/11/2011   Health Maintenance Health Maintenance  Topic Date Due  . INFLUENZA VACCINE  06/22/2020  . COLONOSCOPY  01/07/2025  . TETANUS/TDAP  09/15/2026  . DEXA SCAN  Completed  . COVID-19 Vaccine  Completed  . Hepatitis C Screening  Completed   Colorectal cancer screening: Completed 01/08/20. Repeat every 5 years  Mammogram status: Completed 12/24/19. Repeat every year. Unilateral Left MM with CAD/TOMO.   Bone Density- 11/20/18. Repeat every 2 years.   Influenza vaccine- plans to receive next office visit.   Lung Cancer Screening: (Low Dose CT Chest recommended if Age 80-80 years, 30 pack-year currently smoking OR have quit w/in 15years.) does not qualify.   Hepatitis C Screening: Completed 09/15/16.  Vision Screening: Recommended annual ophthalmology exams for early detection of glaucoma and other disorders of the eye. Is the patient up to date with their annual eye exam?  Yes  Who is the provider or what is the name of the office in which the patient attends annual eye exams? Dr. George Ina  Dental Screening: Recommended annual dental exams for  proper oral hygiene.   Community Resource Referral / Chronic Care Management: CRR required this visit?  No   CCM required this visit?  No      Plan:   Keep all routine maintenance appointments.   Cpe 10/01/20 @ 9:30; will be fasting  I have personally reviewed and noted the following in the patient's chart:   . Medical and social history . Use of alcohol, tobacco or illicit drugs  . Current medications and supplements . Functional ability and status . Nutritional status . Physical activity . Advanced directives . List of other physicians . Hospitalizations, surgeries, and ER visits in previous 12 months . Vitals . Screenings to include cognitive, depression, and falls . Referrals and appointments  In addition, I have reviewed and discussed with patient certain preventive protocols, quality metrics, and best practice recommendations. A written personalized care plan for preventive services as well as general preventive health recommendations were provided to patient via mychart.     Varney Biles, LPN   [id number]    Agree with plan. Mable Paris, NP

## 2020-09-29 NOTE — Patient Instructions (Addendum)
Michelle Clark , Thank you for taking time to come for your Medicare Wellness Visit. I appreciate your ongoing commitment to your health goals. Please review the following plan we discussed and let me know if I can assist you in the future.   These are the goals we discussed: Goals      Patient Stated     Low carb diet (pt-stated)      Monitor sugar intake       This is a list of the screening recommended for you and due dates:  Health Maintenance  Topic Date Due   Flu Shot  06/22/2020   Colon Cancer Screening  01/07/2025   Tetanus Vaccine  09/15/2026   DEXA scan (bone density measurement)  Completed   COVID-19 Vaccine  Completed    Hepatitis C: One time screening is recommended by Center for Disease Control  (CDC) for  adults born from [birth year] through 1966.   Completed    Immunizations Immunization History  Administered Date(s) Administered   Fluad Quad(high Dose 65+) 08/01/2019   Influenza, High Dose Seasonal PF 09/15/2016, 09/15/2017, 09/25/2018   Influenza,inj,Quad PF,6+ Mos 10/15/2013, 09/02/2014, 09/16/2015   Influenza-Unspecified 09/10/2012   Moderna SARS-COVID-2 Vaccination 01/19/2020, 02/16/2020   Pneumococcal Conjugate-13 04/19/2014   Pneumococcal Polysaccharide-23 04/10/2013   Tdap 09/15/2016   Zoster 04/11/2011   Keep all routine maintenance appointments.   Cpe 10/01/20 @ 9:30; will be fasting  Advanced directives: yes, completed  Conditions/risks identified: none new  Follow up in one year for your annual wellness visit.   Preventive Care 77 Years and Older, Female Preventive care refers to lifestyle choices and visits with your health care provider that can promote health and wellness. What does preventive care include?  A yearly physical exam. This is also called an annual well check.  Dental exams once or twice a year.  Routine eye exams. Ask your health care provider how often you should have your eyes checked.  Personal lifestyle  choices, including:  Daily care of your teeth and gums.  Regular physical activity.  Eating a healthy diet.  Avoiding tobacco and drug use.  Limiting alcohol use.  Practicing safe sex.  Taking low-dose aspirin every day.  Taking vitamin and mineral supplements as recommended by your health care provider. What happens during an annual well check? The services and screenings done by your health care provider during your annual well check will depend on your age, overall health, lifestyle risk factors, and family history of disease. Counseling  Your health care provider may ask you questions about your:  Alcohol use.  Tobacco use.  Drug use.  Emotional well-being.  Home and relationship well-being.  Sexual activity.  Eating habits.  History of falls.  Memory and ability to understand (cognition).  Work and work Statistician.  Reproductive health. Screening  You may have the following tests or measurements:  Height, weight, and BMI.  Blood pressure.  Lipid and cholesterol levels. These may be checked every 5 years, or more frequently if you are over 31 years old.  Skin check.  Lung cancer screening. You may have this screening every year starting at age 31 if you have a 30-pack-year history of smoking and currently smoke or have quit within the past 15 years.  Fecal occult blood test (FOBT) of the stool. You may have this test every year starting at age 68.  Flexible sigmoidoscopy or colonoscopy. You may have a sigmoidoscopy every 5 years or a colonoscopy every 10 years starting at  age 52.  Hepatitis C blood test.  Hepatitis B blood test.  Sexually transmitted disease (STD) testing.  Diabetes screening. This is done by checking your blood sugar (glucose) after you have not eaten for a while (fasting). You may have this done every 1-3 years.  Bone density scan. This is done to screen for osteoporosis. You may have this done starting at age  3.  Mammogram. This may be done every 1-2 years. Talk to your health care provider about how often you should have regular mammograms. Talk with your health care provider about your test results, treatment options, and if necessary, the need for more tests. Vaccines  Your health care provider may recommend certain vaccines, such as:  Influenza vaccine. This is recommended every year.  Tetanus, diphtheria, and acellular pertussis (Tdap, Td) vaccine. You may need a Td booster every 10 years.  Zoster vaccine. You may need this after age 23.  Pneumococcal 13-valent conjugate (PCV13) vaccine. One dose is recommended after age 63.  Pneumococcal polysaccharide (PPSV23) vaccine. One dose is recommended after age 30. Talk to your health care provider about which screenings and vaccines you need and how often you need them. This information is not intended to replace advice given to you by your health care provider. Make sure you discuss any questions you have with your health care provider. Document Released: 12/05/2015 Document Revised: 07/28/2016 Document Reviewed: 09/09/2015 Elsevier Interactive Patient Education  2017 [place] Prevention in the Home Falls can cause injuries. They can happen to people of all ages. There are many things you can do to make your home safe and to help prevent falls. What can I do on the outside of my home?  Regularly fix the edges of walkways and driveways and fix any cracks.  Remove anything that might make you trip as you walk through a door, such as a raised step or threshold.  Trim any bushes or trees on the path to your home.  Use bright outdoor lighting.  Clear any walking paths of anything that might make someone trip, such as rocks or tools.  Regularly check to see if handrails are loose or broken. Make sure that both sides of any steps have handrails.  Any raised decks and porches should have guardrails on the edges.  Have any  leaves, snow, or ice cleared regularly.  Use sand or salt on walking paths during winter.  Clean up any spills in your garage right away. This includes oil or grease spills. What can I do in the bathroom?  Use night lights.  Install grab bars by the toilet and in the tub and shower. Do not use towel bars as grab bars.  Use non-skid mats or decals in the tub or shower.  If you need to sit down in the shower, use a plastic, non-slip stool.  Keep the floor dry. Clean up any water that spills on the floor as soon as it happens.  Remove soap buildup in the tub or shower regularly.  Attach bath mats securely with double-sided non-slip rug tape.  Do not have throw rugs and other things on the floor that can make you trip. What can I do in the bedroom?  Use night lights.  Make sure that you have a light by your bed that is easy to reach.  Do not use any sheets or blankets that are too big for your bed. They should not hang down onto the floor.  Have a firm chair  that has side arms. You can use this for support while you get dressed.  Do not have throw rugs and other things on the floor that can make you trip. What can I do in the kitchen?  Clean up any spills right away.  Avoid walking on wet floors.  Keep items that you use a lot in easy-to-reach places.  If you need to reach something above you, use a strong step stool that has a grab bar.  Keep electrical cords out of the way.  Do not use floor polish or wax that makes floors slippery. If you must use wax, use non-skid floor wax.  Do not have throw rugs and other things on the floor that can make you trip. What can I do with my stairs?  Do not leave any items on the stairs.  Make sure that there are handrails on both sides of the stairs and use them. Fix handrails that are broken or loose. Make sure that handrails are as long as the stairways.  Check any carpeting to make sure that it is firmly attached to the stairs.  Fix any carpet that is loose or worn.  Avoid having throw rugs at the top or bottom of the stairs. If you do have throw rugs, attach them to the floor with carpet tape.  Make sure that you have a light switch at the top of the stairs and the bottom of the stairs. If you do not have them, ask someone to add them for you. What else can I do to help prevent falls?  Wear shoes that:  Do not have high heels.  Have rubber bottoms.  Are comfortable and fit you well.  Are closed at the toe. Do not wear sandals.  If you use a stepladder:  Make sure that it is fully opened. Do not climb a closed stepladder.  Make sure that both sides of the stepladder are locked into place.  Ask someone to hold it for you, if possible.  Clearly mark and make sure that you can see:  Any grab bars or handrails.  First and last steps.  Where the edge of each step is.  Use tools that help you move around (mobility aids) if they are needed. These include:  Canes.  Walkers.  Scooters.  Crutches.  Turn on the lights when you go into a dark area. Replace any light bulbs as soon as they burn out.  Set up your furniture so you have a clear path. Avoid moving your furniture around.  If any of your floors are uneven, fix them.  If there are any pets around you, be aware of where they are.  Review your medicines with your doctor. Some medicines can make you feel dizzy. This can increase your chance of falling. Ask your doctor what other things that you can do to help prevent falls. This information is not intended to replace advice given to you by your health care provider. Make sure you discuss any questions you have with your health care provider. Document Released: 09/04/2009 Document Revised: 04/15/2016 Document Reviewed: 12/13/2014 Elsevier Interactive Patient Education  2017 [employer].

## 2020-10-01 ENCOUNTER — Ambulatory Visit (INDEPENDENT_AMBULATORY_CARE_PROVIDER_SITE_OTHER): Payer: PPO | Admitting: Family

## 2020-10-01 ENCOUNTER — Other Ambulatory Visit: Payer: Self-pay

## 2020-10-01 ENCOUNTER — Encounter: Payer: Self-pay | Admitting: Family

## 2020-10-01 VITALS — BP 120/78 | HR 83 | Temp 98.4°F | Ht 65.5 in | Wt 196.8 lb

## 2020-10-01 DIAGNOSIS — Z23 Encounter for immunization: Secondary | ICD-10-CM | POA: Diagnosis not present

## 2020-10-01 DIAGNOSIS — F339 Major depressive disorder, recurrent, unspecified: Secondary | ICD-10-CM | POA: Diagnosis not present

## 2020-10-01 DIAGNOSIS — Z0000 Encounter for general adult medical examination without abnormal findings: Secondary | ICD-10-CM | POA: Diagnosis not present

## 2020-10-01 DIAGNOSIS — I10 Essential (primary) hypertension: Secondary | ICD-10-CM

## 2020-10-01 LAB — LIPID PANEL
Cholesterol: 137 mg/dL (ref 0–200)
HDL: 50.8 mg/dL (ref >39.00)
LDL Cholesterol: 71 mg/dL (ref 0–99)
NonHDL: 86.06
Total CHOL/HDL Ratio: 3
Triglycerides: 77 mg/dL (ref 0.0–149.0)
VLDL: 15.4 mg/dL (ref 0.0–40.0)

## 2020-10-01 LAB — CBC WITH DIFFERENTIAL/PLATELET
Basophils Absolute: 0.1 10*3/uL (ref 0.0–0.1)
Basophils Relative: 1 % (ref 0.0–3.0)
Eosinophils Absolute: 0.2 10*3/uL (ref 0.0–0.7)
Eosinophils Relative: 3.1 % (ref 0.0–5.0)
HCT: 38.1 % (ref 36.0–46.0)
Hemoglobin: 12.4 g/dL (ref 12.0–15.0)
Lymphocytes Relative: 31.6 % (ref 12.0–46.0)
Lymphs Abs: 2.5 10*3/uL (ref 0.7–4.0)
MCHC: 32.6 g/dL (ref 30.0–36.0)
MCV: 85.6 fl (ref 78.0–100.0)
Monocytes Absolute: 0.7 10*3/uL (ref 0.1–1.0)
Monocytes Relative: 9.3 % (ref 3.0–12.0)
Neutro Abs: 4.3 10*3/uL (ref 1.4–7.7)
Neutrophils Relative %: 55 % (ref 43.0–77.0)
Platelets: 231 10*3/uL (ref 150.0–400.0)
RBC: 4.46 Mil/uL (ref 3.87–5.11)
RDW: 13.6 % (ref 11.5–15.5)
WBC: 7.8 10*3/uL (ref 4.0–10.5)

## 2020-10-01 LAB — COMPREHENSIVE METABOLIC PANEL
ALT: 18 U/L (ref 0–35)
AST: 21 U/L (ref 0–37)
Albumin: 4.2 g/dL (ref 3.5–5.2)
Alkaline Phosphatase: 102 U/L (ref 39–117)
BUN: 19 mg/dL (ref 6–23)
CO2: 26 mEq/L (ref 19–32)
Calcium: 9.1 mg/dL (ref 8.4–10.5)
Chloride: 102 mEq/L (ref 96–112)
Creatinine, Ser: 0.89 mg/dL (ref 0.40–1.20)
GFR: 63.28 mL/min (ref >60.00)
Glucose, Bld: 83 mg/dL (ref 70–99)
Potassium: 3.8 mEq/L (ref 3.5–5.1)
Sodium: 136 mEq/L (ref 135–145)
Total Bilirubin: 0.5 mg/dL (ref 0.2–1.2)
Total Protein: 7 g/dL (ref 6.0–8.3)

## 2020-10-01 LAB — TSH: TSH: 3.75 u[IU]/mL (ref 0.35–4.50)

## 2020-10-01 LAB — HEMOGLOBIN A1C: Hgb A1c MFr Bld: 6.1 % (ref 4.6–6.5)

## 2020-10-01 LAB — VITAMIN D 25 HYDROXY (VIT D DEFICIENCY, FRACTURES): VITD: 54.68 ng/mL (ref 30.00–100.00)

## 2020-10-01 NOTE — Assessment & Plan Note (Signed)
Stable, continue hctz 12.5mg , amlodipine 5mg , losartan 100mg ,  toprol 50mg 

## 2020-10-01 NOTE — Progress Notes (Signed)
Subjective:    Patient ID: Michelle Clark, female    DOB: 1946/02/11, 74 y.o.   MRN: [id number]  CC: Michelle Clark is a 74 y.o. female who presents today for physical exam and follow up.    HPI: Feels well today No complaints  HTN- compliant with amlodipine, hctz, lostartan, toprol.  no sob , cp  Depression - zoloft and wellbutrin are working well. Sleeping well.  No si/hi  HLD - compliant with zocor  H/o right mastectomy 20+ years ago. No longer follows with oncology    Colorectal Cancer Screening: UTD  Breast Cancer Screening: Mammogram UTD Cervical Cancer Screening: Hysterectomy due to heavy bleeding , not for cancer. Have cervix. No h/o abnormal pap smear. No vaginal bleeding, pelvic pain.  Declines pap smear and pelvic exam.  Bone Health screening/DEXA for 65+: due Lung Cancer Screening: Doesn't have 30 year pack year history and age > 96 years yo 10 years  No  family history of AAA.        Tetanus -         Pneumococcal - Candidate for, complete.   Labs: Screening labs today. Exercise: No formal exercise outside of house work. No cp, sob Alcohol use:  none Smoking/tobacco use: Nonsmoker.     HISTORY:  Past Medical History:  Diagnosis Date  . Cancer [facility]) 2001   Right breast, s/p chemotherapy, XRT and mastectomy, Dr. Jeb Levering  . Colon polyps   . Hyperlipidemia   . Hypertension   . Personal history of chemotherapy   . Personal history of radiation therapy   . Pre-diabetes     Past Surgical History:  Procedure Laterality Date  . ABDOMINAL HYSTERECTOMY  1998   cervix intact  . APPENDECTOMY    . BREAST SURGERY  2002   mastectomy  . CATARACT EXTRACTION W/PHACO Right 10/09/2019   Procedure: CATARACT EXTRACTION PHACO AND INTRAOCULAR LENS PLACEMENT (IOC) RIGHT DIABETIC 9.43, 00:53;  Surgeon: Birder Robson, MD;  Location: [facility];  Service: Ophthalmology;  Laterality: Right;  . CHOLECYSTECTOMY    . COLONOSCOPY WITH PROPOFOL N/A 01/08/2020    Procedure: COLONOSCOPY WITH PROPOFOL;  Surgeon: Lin Landsman, MD;  Location: [facility];  Service: Gastroenterology;  Laterality: N/A;  . MASTECTOMY Right 2001   chemo, rad  . OOPHORECTOMY    . POLYPECTOMY  01/08/2020   Procedure: POLYPECTOMY;  Surgeon: Lin Landsman, MD;  Location: [place];  Service: Gastroenterology;;  . TUBAL LIGATION     Family History  Problem Relation Age of Onset  . Arthritis Mother   . Stroke Mother 18  . Arthritis Sister   . Stroke Sister 68       paralyzed left side  . Breast cancer Sister 49  . Cancer Sister        breast  . Heart disease Brother   . Asthma Brother   . Lung cancer Son   . Breast cancer Other   . Breast cancer Cousin        maternal - 64's      ALLERGIES: Fluoxetine  Current Outpatient Medications on File Prior to Visit  Medication Sig Dispense Refill  . amLODipine (NORVASC) 5 MG tablet TAKE 1 TABLET BY MOUTH ONCE DAILY 90 tablet 3  . aspirin 81 MG tablet Take 81 mg by mouth daily.    Marland Kitchen buPROPion (WELLBUTRIN XL) 300 MG 24 hr tablet TAKE 1 TABLET BY MOUTH EVERY MORNING 90 tablet 1  . Cholecalciferol (VITAMIN D)  2000 units tablet Take 1 tablet by mouth daily.    . cyclobenzaprine (FLEXERIL) 5 MG tablet TAKE 1 TABLET BY MOUTH EVERY NIGHT AT BEDTIME AS NEEDED FOR MUSCLE SPASMS 30 tablet 0  . hydrochlorothiazide (MICROZIDE) 12.5 MG capsule TAKE 1 CAPSULE BY MOUTH ONCE DAILY 90 capsule 1  . losartan (COZAAR) 100 MG tablet TAKE 1 TABLET BY MOUTH ONCE DAILY 90 tablet 3  . metoprolol succinate (TOPROL-XL) 50 MG 24 hr tablet TAKE 1 TABLET BY MOUTH ONCE A DAY IMMEDIATELY FOLLOWING A MEAL 90 tablet 3  . Multiple Vitamin (MULTIVITAMIN) tablet Take 1 tablet by mouth daily.    . naproxen sodium (ANAPROX) 220 MG tablet Take 220 mg by mouth 2 (two) times daily with a meal.    . sertraline (ZOLOFT) 100 MG tablet TAKE 1 TABLET BY MOUTH EVERY NIGHT AT BEDTIME 90 tablet 1  . simvastatin (ZOCOR) 20 MG tablet TAKE 1  TABLET BY MOUTH ONCE DAILY 90 tablet 3   No current facility-administered medications on file prior to visit.    Social History   Tobacco Use  . Smoking status: Former Smoker    Quit date: 04/10/1997    Years since quitting: 23.4  . Smokeless tobacco: Never Used  Vaping Use  . Vaping Use: Never used  Substance Use Topics  . Alcohol use: Not Currently    Alcohol/week: 1.0 standard drink    Types: 1 Glasses of wine per week  . Drug use: No    Review of Systems  Constitutional: Negative for chills, fever and unexpected weight change.  HENT: Negative for congestion.   Respiratory: Negative for cough.   Cardiovascular: Negative for chest pain, palpitations and leg swelling.  Gastrointestinal: Negative for nausea and vomiting.  Genitourinary: Negative for vaginal bleeding, vaginal discharge and vaginal pain.  Musculoskeletal: Negative for arthralgias and myalgias.  Skin: Negative for rash.  Neurological: Negative for headaches.  Hematological: Negative for adenopathy.  Psychiatric/Behavioral: Negative for confusion.      Objective:    BP 120/78   Pulse 83   Temp 98.4 F (36.9 C)   Ht 5' 5.5" (1.664 m)   Wt 196 lb 12.8 oz (89.3 kg)   SpO2 98%   BMI 32.25 kg/m   BP Readings from Last 3 Encounters:  10/01/20 120/78  09/29/20 (!) 129/95  01/08/20 118/66   Wt Readings from Last 3 Encounters:  10/01/20 196 lb 12.8 oz (89.3 kg)  09/29/20 210 lb (95.3 kg)  01/08/20 196 lb (88.9 kg)    Physical Exam Vitals reviewed.  Constitutional:      Appearance: She is well-developed.  Eyes:     Conjunctiva/sclera: Conjunctivae normal.  Neck:     Thyroid: No thyroid mass or thyromegaly.  Cardiovascular:     Rate and Rhythm: Normal rate and regular rhythm.     Pulses: Normal pulses.     Heart sounds: Normal heart sounds.  Pulmonary:     Effort: Pulmonary effort is normal.     Breath sounds: Normal breath sounds. No wheezing, rhonchi or rales.  Chest:     Breasts:         Right: Absent.        Left: No inverted nipple, mass, nipple discharge, skin change or tenderness.  Lymphadenopathy:     Head:     Right side of head: No submental, submandibular, tonsillar, preauricular, posterior auricular or occipital adenopathy.     Left side of head: No submental, submandibular, tonsillar, preauricular, posterior auricular or occipital  adenopathy.     Cervical: No cervical adenopathy.     Right cervical: No superficial, deep or posterior cervical adenopathy.    Left cervical: No superficial, deep or posterior cervical adenopathy.  Skin:    General: Skin is warm and dry.  Neurological:     Mental Status: She is alert.  Psychiatric:        Speech: Speech normal.        Behavior: Behavior normal.        Thought Content: Thought content normal.        Assessment & Plan:   Problem List Items Addressed This Visit      Cardiovascular and Mediastinum   HTN (hypertension)    Stable, continue hctz 12.5mg , amlodipine 5mg , losartan 100mg ,  toprol 50mg         Other   Depression, recurrent (HCC)    Stable. Continue wellbutrin 300mg  and zoloft 100mg       Routine health maintenance - Primary    CBE performed of left breast. Left breast mammogram ordered with dexa and patient will schedule. Patient declines pelvic exam today. She is no longer screening for cervical cancer. Encouraged walking program.       Relevant Orders   MM 3D SCREEN BREAST BILATERAL   DG Bone Density   TSH   CBC with Differential/Platelet   Comprehensive metabolic panel   Hemoglobin A1c   Lipid panel   VITAMIN D 25 Hydroxy (Vit-D Deficiency, Fractures)   MM 3D SCREEN BREAST UNI LEFT       I am having Kecia B. Sessums maintain her multivitamin, aspirin, naproxen sodium, Vitamin D, cyclobenzaprine, simvastatin, amLODipine, losartan, hydrochlorothiazide, metoprolol succinate, sertraline, and buPROPion.   No orders of the defined types were placed in this encounter.   Return precautions  given.   Risks, benefits, and alternatives of the medications and treatment plan prescribed today were discussed, and patient expressed understanding.   Education regarding symptom management and diagnosis given to patient on AVS.   Continue to follow with Burnard Hawthorne, FNP for routine health maintenance.   Michelle Clark and I agreed with plan.   Mable Paris, FNP

## 2020-10-01 NOTE — Patient Instructions (Signed)
Please call  and schedule your 3D mammogram, bone density scan as discussed due in Feb   [facility]  [place], [place]   Health Maintenance for Postmenopausal Women Menopause is a normal process in which your ability to get pregnant comes to an end. This process happens slowly over many months or years, usually between the ages of 32 and 89. Menopause is complete when you have missed your menstrual periods for 12 months. It is important to talk with your health care provider about some of the most common conditions that affect women after menopause (postmenopausal women). These include heart disease, cancer, and bone loss (osteoporosis). Adopting a healthy lifestyle and getting preventive care can help to promote your health and wellness. The actions you take can also lower your chances of developing some of these common conditions. What should I know about menopause? During menopause, you may get a number of symptoms, such as:  Hot flashes. These can be moderate or severe.  Night sweats.  Decrease in sex drive.  Mood swings.  Headaches.  Tiredness.  Irritability.  Memory problems.  Insomnia. Choosing to treat or not to treat these symptoms is a decision that you make with your health care provider. Do I need hormone replacement therapy?  Hormone replacement therapy is effective in treating symptoms that are caused by menopause, such as hot flashes and night sweats.  Hormone replacement carries certain risks, especially as you become older. If you are thinking about using estrogen or estrogen with progestin, discuss the benefits and risks with your health care provider. What is my risk for heart disease and stroke? The risk of heart disease, heart attack, and stroke increases as you age. One of the causes may be a change in the body's hormones during menopause. This can affect how your body uses dietary fats, triglycerides,  and cholesterol. Heart attack and stroke are medical emergencies. There are many things that you can do to help prevent heart disease and stroke. Watch your blood pressure  High blood pressure causes heart disease and increases the risk of stroke. This is more likely to develop in people who have high blood pressure readings, are of African descent, or are overweight.  Have your blood pressure checked: ? Every 3-5 years if you are 30-19 years of age. ? Every year if you are 33 years old or older. Eat a healthy diet   Eat a diet that includes plenty of vegetables, fruits, low-fat dairy products, and lean protein.  Do not eat a lot of foods that are high in solid fats, added sugars, or sodium. Get regular exercise Get regular exercise. This is one of the most important things you can do for your health. Most adults should:  Try to exercise for at least 150 minutes each week. The exercise should increase your heart rate and make you sweat (moderate-intensity exercise).  Try to do strengthening exercises at least twice each week. Do these in addition to the moderate-intensity exercise.  Spend less time sitting. Even light physical activity can be beneficial. Other tips  Work with your health care provider to achieve or maintain a healthy weight.  Do not use any products that contain nicotine or tobacco, such as cigarettes, e-cigarettes, and chewing tobacco. If you need help quitting, ask your health care provider.  Know your numbers. Ask your health care provider to check your cholesterol and your blood sugar (glucose). Continue to have your blood tested as  directed by your health care provider. Do I need screening for cancer? Depending on your health history and family history, you may need to have cancer screening at different stages of your life. This may include screening for:  Breast cancer.  Cervical cancer.  Lung cancer.  Colorectal cancer. What is my risk for  osteoporosis? After menopause, you may be at increased risk for osteoporosis. Osteoporosis is a condition in which bone destruction happens more quickly than new bone creation. To help prevent osteoporosis or the bone fractures that can happen because of osteoporosis, you may take the following actions:  If you are 77-35 years old, get at least 1,000 mg of calcium and at least 600 mg of vitamin D per day.  If you are older than age 54 but younger than age 80, get at least 1,200 mg of calcium and at least 600 mg of vitamin D per day.  If you are older than age 12, get at least 1,200 mg of calcium and at least 800 mg of vitamin D per day. Smoking and drinking excessive alcohol increase the risk of osteoporosis. Eat foods that are rich in calcium and vitamin D, and do weight-bearing exercises several times each week as directed by your health care provider. How does menopause affect my mental health? Depression may occur at any age, but it is more common as you become older. Common symptoms of depression include:  Low or sad mood.  Changes in sleep patterns.  Changes in appetite or eating patterns.  Feeling an overall lack of motivation or enjoyment of activities that you previously enjoyed.  Frequent crying spells. Talk with your health care provider if you think that you are experiencing depression. General instructions See your health care provider for regular wellness exams and vaccines. This may include:  Scheduling regular health, dental, and eye exams.  Getting and maintaining your vaccines. These include: ? Influenza vaccine. Get this vaccine each year before the flu season begins. ? Pneumonia vaccine. ? Shingles vaccine. ? Tetanus, diphtheria, and pertussis (Tdap) booster vaccine. Your health care provider may also recommend other immunizations. Tell your health care provider if you have ever been abused or do not feel safe at home. Summary  Menopause is a normal process in  which your ability to get pregnant comes to an end.  This condition causes hot flashes, night sweats, decreased interest in sex, mood swings, headaches, or lack of sleep.  Treatment for this condition may include hormone replacement therapy.  Take actions to keep yourself healthy, including exercising regularly, eating a healthy diet, watching your weight, and checking your blood pressure and blood sugar levels.  Get screened for cancer and depression. Make sure that you are up to date with all your vaccines. This information is not intended to replace advice given to you by your health care provider. Make sure you discuss any questions you have with your health care provider. Document Revised: 11/01/2018 Document Reviewed: 11/01/2018 Elsevier Patient Education  2020 [employer].

## 2020-10-01 NOTE — Assessment & Plan Note (Signed)
Stable. Continue wellbutrin 300mg  and zoloft 100mg 

## 2020-10-01 NOTE — Assessment & Plan Note (Signed)
CBE performed of left breast. Left breast mammogram ordered with dexa and patient will schedule. Patient declines pelvic exam today. She is no longer screening for cervical cancer. Encouraged walking program.

## 2020-10-27 ENCOUNTER — Other Ambulatory Visit: Payer: Self-pay | Admitting: Family

## 2020-10-27 DIAGNOSIS — I10 Essential (primary) hypertension: Secondary | ICD-10-CM

## 2020-10-31 ENCOUNTER — Encounter: Payer: Self-pay | Admitting: Family

## 2020-12-15 ENCOUNTER — Other Ambulatory Visit: Payer: Self-pay

## 2020-12-15 ENCOUNTER — Ambulatory Visit
Admission: RE | Admit: 2020-12-15 | Discharge: 2020-12-15 | Disposition: A | Discharge status: Home / Self Care | Payer: Medicare Other | Source: Ambulatory Visit | Attending: Family | Admitting: Family

## 2020-12-15 DIAGNOSIS — Z9221 Personal history of antineoplastic chemotherapy: Secondary | ICD-10-CM | POA: Insufficient documentation

## 2020-12-15 DIAGNOSIS — M8589 Other specified disorders of bone density and structure, multiple sites: Secondary | ICD-10-CM | POA: Diagnosis not present

## 2020-12-15 DIAGNOSIS — Z780 Asymptomatic menopausal state: Secondary | ICD-10-CM | POA: Diagnosis not present

## 2020-12-15 DIAGNOSIS — Z923 Personal history of irradiation: Secondary | ICD-10-CM | POA: Insufficient documentation

## 2020-12-15 DIAGNOSIS — Z853 Personal history of malignant neoplasm of breast: Secondary | ICD-10-CM | POA: Diagnosis not present

## 2020-12-15 DIAGNOSIS — Z13820 Encounter for screening for osteoporosis: Secondary | ICD-10-CM | POA: Diagnosis not present

## 2020-12-15 DIAGNOSIS — M85852 Other specified disorders of bone density and structure, left thigh: Secondary | ICD-10-CM | POA: Diagnosis not present

## 2020-12-15 DIAGNOSIS — Z0000 Encounter for general adult medical examination without abnormal findings: Secondary | ICD-10-CM

## 2021-01-01 ENCOUNTER — Other Ambulatory Visit: Payer: Self-pay

## 2021-01-01 ENCOUNTER — Ambulatory Visit
Admission: RE | Admit: 2021-01-01 | Discharge: 2021-01-01 | Disposition: A | Discharge status: Home / Self Care | Payer: Medicare Other | Source: Ambulatory Visit | Attending: Family | Admitting: Family

## 2021-01-01 DIAGNOSIS — Z0000 Encounter for general adult medical examination without abnormal findings: Secondary | ICD-10-CM | POA: Insufficient documentation

## 2021-01-01 DIAGNOSIS — Z1231 Encounter for screening mammogram for malignant neoplasm of breast: Secondary | ICD-10-CM | POA: Diagnosis not present

## 2021-01-30 ENCOUNTER — Ambulatory Visit (INDEPENDENT_AMBULATORY_CARE_PROVIDER_SITE_OTHER): Payer: Medicare Other | Admitting: Family

## 2021-01-30 ENCOUNTER — Encounter: Payer: Self-pay | Admitting: Family

## 2021-01-30 ENCOUNTER — Other Ambulatory Visit: Payer: Self-pay

## 2021-01-30 VITALS — BP 120/82 | HR 75 | Temp 98.3°F | Ht 65.5 in | Wt 190.6 lb

## 2021-01-30 DIAGNOSIS — I10 Essential (primary) hypertension: Secondary | ICD-10-CM

## 2021-01-30 DIAGNOSIS — Z7982 Long term (current) use of aspirin: Secondary | ICD-10-CM | POA: Diagnosis not present

## 2021-01-30 DIAGNOSIS — E782 Mixed hyperlipidemia: Secondary | ICD-10-CM

## 2021-01-30 DIAGNOSIS — F339 Major depressive disorder, recurrent, unspecified: Secondary | ICD-10-CM

## 2021-01-30 LAB — HEMOGLOBIN A1C: Hgb A1c MFr Bld: 5.8 % (ref 4.6–6.5)

## 2021-01-30 LAB — BASIC METABOLIC PANEL
BUN: 15 mg/dL (ref 6–23)
CO2: 25 mEq/L (ref 19–32)
Calcium: 9.7 mg/dL (ref 8.4–10.5)
Chloride: 101 mEq/L (ref 96–112)
Creatinine, Ser: 0.85 mg/dL (ref 0.40–1.20)
GFR: 66.71 mL/min (ref >60.00)
Glucose, Bld: 74 mg/dL (ref 70–99)
Potassium: 3.9 mEq/L (ref 3.5–5.1)
Sodium: 136 mEq/L (ref 135–145)

## 2021-01-30 NOTE — Assessment & Plan Note (Signed)
Controlled. Continue wellbutrin 300mg  and zoloft 100mg 

## 2021-01-30 NOTE — Assessment & Plan Note (Addendum)
Controlled. LDL 71. Continue zocor 20mg .

## 2021-01-30 NOTE — Patient Instructions (Addendum)
As discussed, for now we will continue aspirin 81mg with close attention to benefit versus risk.  Benefit being reduced risk of ischemic stroke, non fatal heart attack and colorectal cancer ( if used for 10 years). However these benefits are most apparent in adults aged 76 to 69 years with a ?10% 10-year cardiovasular risk . Persons who are not at increased risk for bleeding, have a life expectancy of at least 10 years, and are willing to take low-dose aspirin daily for at least 10 years are more likely to benefit. Persons who place a higher value on the potential benefits than the potential harms may choose to initiate low-dose aspirin.  At 70 years and above the risk of gastrointestinal bleeding, falls,  hemorrhagic stroke increases therefore becoming a very personal discussion in regards to whether you continue aspirin 81 mg.   For now , we have opted to continue however please bring to my attention at follow so we can continue to have this discussion.    For post menopausal women, guidelines recommend a diet with 1200 mg of Calcium per day. If you are eating calcium rich foods, you do not need a calcium supplement. The body better absorbs the calcium that you eat over supplementation. If you do supplement, I recommend not supplementing the full 1200 mg/ day as this can lead to increased risk of cardiovascular disease. I recommend Calcium Citrate over the counter, and you may take a total of 600 to 800 mg per day in divided doses with meals for best absorption.   For bone health, you need adequate vitamin D, and I recommend you supplement as it is harder to do so with diet alone. I recommend cholecalciferol 800 units daily.  Also, please ensure you are following a diet high in calcium -- research shows better outcomes with dietary sources including kale, yogurt, broccolii, cheese, okra, almonds- to name a few.     Also remember that exercise is a great medicine for maintain and preserve bone health.  Advise moderate exercise for 30 minutes , 3 times per week.     

## 2021-01-30 NOTE — Assessment & Plan Note (Signed)
discussed ASA use at length today. No h/o CAD. Patient is > 70 years and we discussed risk of bleeding.  Advised reasonable to discontinue ASA. Patient would like to think about all of this and I have provided her information discussing risk and benefit on AVS. We will continue to discuss at future visits.

## 2021-01-30 NOTE — Assessment & Plan Note (Signed)
Well controlled. Continue hctz 12.5mg , amlodipine 5mg , losartan 100mg ,  toprol 50mg 

## 2021-01-30 NOTE — Progress Notes (Signed)
Subjective:    Patient ID: Michelle Clark, female    DOB: 1946/02/25, 76 y.o.   MRN: [id number]  CC: Michelle Clark is a 75 y.o. female who presents today for follow up.   HPI: Feels well today No new complaints.  HTN- compliant with hctz 12.5mg , amlodipine 5mg , losartan 100mg ,  toprol 50mg . No cp.   Depression- compliant with wellbutrin 300mg  and zoloft 100mg  and feels working well. Sleeping well . No si/hi  HLD- compliant with zocor 20mg .   Prediabetes   She has been on 81mg  ASA for 'years'. No bleeding, h/o GIB, MI, CVA, cardiac stent. family h/o colon cancer.    HISTORY:  Past Medical History:  Diagnosis Date  . Cancer [facility]) 2001   Right breast, s/p chemotherapy, XRT and mastectomy, Dr. Jeb Levering  . Colon polyps   . Hyperlipidemia   . Hypertension   . Personal history of chemotherapy   . Personal history of radiation therapy   . Pre-diabetes    Past Surgical History:  Procedure Laterality Date  . ABDOMINAL HYSTERECTOMY  1998   cervix intact  . APPENDECTOMY    . BREAST SURGERY  2002   mastectomy  . CATARACT EXTRACTION W/PHACO Right 10/09/2019   Procedure: CATARACT EXTRACTION PHACO AND INTRAOCULAR LENS PLACEMENT (IOC) RIGHT DIABETIC 9.43, 00:53;  Surgeon: Birder Robson, MD;  Location: [place];  Service: Ophthalmology;  Laterality: Right;  . CHOLECYSTECTOMY    . COLONOSCOPY WITH PROPOFOL N/A 01/08/2020   Procedure: COLONOSCOPY WITH PROPOFOL;  Surgeon: Lin Landsman, MD;  Location: [place];  Service: Gastroenterology;  Laterality: N/A;  . MASTECTOMY Right 2001   chemo, rad  . OOPHORECTOMY    . POLYPECTOMY  01/08/2020   Procedure: POLYPECTOMY;  Surgeon: Lin Landsman, MD;  Location: [place];  Service: Gastroenterology;;  . TUBAL LIGATION     Family History  Problem Relation Age of Onset  . Arthritis Mother   . Stroke Mother 52  . Arthritis Sister   . Stroke Sister 68       paralyzed left side  . Breast cancer  Sister 14  . Cancer Sister        breast  . Heart disease Brother   . Asthma Brother   . Colon cancer Brother   . Lung cancer Son   . Breast cancer Other   . Breast cancer Cousin        maternal - 30's    Allergies: Fluoxetine Current Outpatient Medications on File Prior to Visit  Medication Sig Dispense Refill  . amLODipine (NORVASC) 5 MG tablet TAKE 1 TABLET BY MOUTH ONCE DAILY 90 tablet 3  . aspirin 81 MG tablet Take 81 mg by mouth daily.    Marland Kitchen buPROPion (WELLBUTRIN XL) 300 MG 24 hr tablet TAKE 1 TABLET BY MOUTH EVERY MORNING 90 tablet 1  . Cholecalciferol (VITAMIN D) 2000 units tablet Take 1 tablet by mouth daily.    . cyclobenzaprine (FLEXERIL) 5 MG tablet TAKE 1 TABLET BY MOUTH EVERY NIGHT AT BEDTIME AS NEEDED FOR MUSCLE SPASMS 30 tablet 0  . hydrochlorothiazide (MICROZIDE) 12.5 MG capsule TAKE 1 CAPSULE BY MOUTH ONCE DAILY 90 capsule 1  . losartan (COZAAR) 100 MG tablet TAKE 1 TABLET BY MOUTH ONCE DAILY 90 tablet 3  . metoprolol succinate (TOPROL-XL) 50 MG 24 hr tablet TAKE 1 TABLET BY MOUTH ONCE A DAY IMMEDIATELY FOLLOWING A MEAL 90 tablet 3  . Multiple Vitamin (MULTIVITAMIN) tablet Take 1 tablet by  mouth daily.    . naproxen sodium (ANAPROX) 220 MG tablet Take 220 mg by mouth 2 (two) times daily with a meal.    . sertraline (ZOLOFT) 100 MG tablet TAKE 1 TABLET BY MOUTH EVERY NIGHT AT BEDTIME 90 tablet 1  . simvastatin (ZOCOR) 20 MG tablet TAKE 1 TABLET BY MOUTH ONCE DAILY 90 tablet 3   No current facility-administered medications on file prior to visit.    Social History   Tobacco Use  . Smoking status: Former Smoker    Quit date: 04/10/1997    Years since quitting: 23.8  . Smokeless tobacco: Never Used  Vaping Use  . Vaping Use: Never used  Substance Use Topics  . Alcohol use: Not Currently    Alcohol/week: 1.0 standard drink    Types: 1 Glasses of wine per week  . Drug use: No    Review of Systems  Constitutional: Negative for chills and fever.  Respiratory:  Negative for cough.   Cardiovascular: Negative for chest pain and palpitations.  Gastrointestinal: Negative for nausea and vomiting.      Objective:    BP 120/82   Pulse 75   Temp 98.3 F (36.8 C)   Ht 5' 5.5" (1.664 m)   Wt 190 lb 9.6 oz (86.5 kg)   SpO2 98%   BMI 31.24 kg/m  BP Readings from Last 3 Encounters:  01/30/21 120/82  10/01/20 120/78  09/29/20 (!) 129/95   Wt Readings from Last 3 Encounters:  01/30/21 190 lb 9.6 oz (86.5 kg)  10/01/20 196 lb 12.8 oz (89.3 kg)  09/29/20 210 lb (95.3 kg)    Physical Exam Vitals reviewed.  Constitutional:      Appearance: She is well-developed.  Eyes:     Conjunctiva/sclera: Conjunctivae normal.  Cardiovascular:     Rate and Rhythm: Normal rate and regular rhythm.     Pulses: Normal pulses.     Heart sounds: Normal heart sounds.  Pulmonary:     Effort: Pulmonary effort is normal.     Breath sounds: Normal breath sounds. No wheezing, rhonchi or rales.  Skin:    General: Skin is warm and dry.  Neurological:     Mental Status: She is alert.  Psychiatric:        Speech: Speech normal.        Behavior: Behavior normal.        Thought Content: Thought content normal.        Assessment & Plan:   Problem List Items Addressed This Visit      Cardiovascular and Mediastinum   Essential hypertension, benign - Primary   Relevant Orders   Basic metabolic panel   Hemoglobin A1c   HTN (hypertension)    Well controlled. Continue hctz 12.5mg , amlodipine 5mg , losartan 100mg ,  toprol 50mg         Other   Aspirin long-term use     discussed ASA use at length today. No h/o CAD. Patient is > 70 years and we discussed risk of bleeding.  Advised reasonable to discontinue ASA. Patient would like to think about all of this and I have provided her information discussing risk and benefit on AVS. We will continue to discuss at future visits.        Depression, recurrent ([place])    Controlled. Continue wellbutrin 300mg  and zoloft  100mg       Hyperlipidemia    Controlled. LDL 71. Continue zocor 20mg .           I am  having Sun B. Nida maintain her multivitamin, aspirin, naproxen sodium, Vitamin D, cyclobenzaprine, simvastatin, amLODipine, losartan, metoprolol succinate, sertraline, buPROPion, and hydrochlorothiazide.   No orders of the defined types were placed in this encounter.   Return precautions given.   Risks, benefits, and alternatives of the medications and treatment plan prescribed today were discussed, and patient expressed understanding.   Education regarding symptom management and diagnosis given to patient on AVS.  Continue to follow with Burnard Hawthorne, FNP for routine health maintenance.   Michelle Clark and I agreed with plan.   Mable Paris, FNP

## 2021-02-10 ENCOUNTER — Other Ambulatory Visit: Payer: Self-pay | Admitting: Family

## 2021-03-10 ENCOUNTER — Other Ambulatory Visit: Payer: Self-pay | Admitting: Internal Medicine

## 2021-03-10 ENCOUNTER — Other Ambulatory Visit: Payer: Self-pay | Admitting: Family Medicine

## 2021-03-10 DIAGNOSIS — F339 Major depressive disorder, recurrent, unspecified: Secondary | ICD-10-CM

## 2021-03-24 ENCOUNTER — Other Ambulatory Visit: Payer: Self-pay | Admitting: Family

## 2021-03-24 DIAGNOSIS — F339 Major depressive disorder, recurrent, unspecified: Secondary | ICD-10-CM

## 2021-06-01 ENCOUNTER — Ambulatory Visit (INDEPENDENT_AMBULATORY_CARE_PROVIDER_SITE_OTHER): Payer: Medicare Other | Admitting: Family

## 2021-06-01 ENCOUNTER — Encounter: Payer: Self-pay | Admitting: Family

## 2021-06-01 ENCOUNTER — Other Ambulatory Visit: Payer: Self-pay

## 2021-06-01 VITALS — BP 126/70 | HR 76 | Temp 97.9°F | Ht 65.5 in | Wt 189.6 lb

## 2021-06-01 DIAGNOSIS — E782 Mixed hyperlipidemia: Secondary | ICD-10-CM | POA: Diagnosis not present

## 2021-06-01 DIAGNOSIS — Z7982 Long term (current) use of aspirin: Secondary | ICD-10-CM

## 2021-06-01 DIAGNOSIS — I10 Essential (primary) hypertension: Secondary | ICD-10-CM

## 2021-06-01 DIAGNOSIS — F339 Major depressive disorder, recurrent, unspecified: Secondary | ICD-10-CM

## 2021-06-01 MED ORDER — HYDROCHLOROTHIAZIDE 12.5 MG PO CAPS: 12.5000 mg | ORAL_CAPSULE | Freq: Every day | ORAL | 1 refills | Status: DC

## 2021-06-01 MED ORDER — METOPROLOL SUCCINATE ER 50 MG PO TB24: ORAL_TABLET | ORAL | 3 refills | Status: DC

## 2021-06-01 MED ORDER — SIMVASTATIN 20 MG PO TABS
20.0000 mg | ORAL_TABLET | Freq: Every day | ORAL | 3 refills | Status: DC
Start: 2021-06-01 — End: 2022-06-17

## 2021-06-01 NOTE — Progress Notes (Signed)
Subjective:    Patient ID: Michelle Clark, female    DOB: Feb 24, 1946, 75 y.o.   MRN: [id number]  CC: Michelle Clark is a 75 y.o. female who presents today for follow up.   HPI: Feels well today No new complaints She would like to discuss ASA 81mg  again and not sure if she wants to continue medication.   No h/o CAD, stent. Sister and father had stroke. No family h/o MI.   HLD-compliant with zocor 20mg   HTN- compliant with hctz 12.5mg , amlodipine 5mg , losartan 100mg ,  toprol 50mg . No cp, sob.   Depression- compliant with wellbutrin 300mg  and zoloft 100mg . Feels doses are adequate.     She had followed with Dr Michelle Clark in the past, last visit 04/2018.  Patient reports normal stress test years ago.   Prediabetes  Mammogram UTD  HISTORY:  Past Medical History:  Diagnosis Date   Cancer [facility]) 2001   Right breast, s/p chemotherapy, XRT and mastectomy, Dr. Jeb Levering   Colon polyps    Hyperlipidemia    Hypertension    Personal history of chemotherapy    Personal history of radiation therapy    Pre-diabetes    Past Surgical History:  Procedure Laterality Date   ABDOMINAL HYSTERECTOMY  1998   cervix intact   APPENDECTOMY     BREAST SURGERY  2002   mastectomy   CATARACT EXTRACTION W/PHACO Right 10/09/2019   Procedure: CATARACT EXTRACTION PHACO AND INTRAOCULAR LENS PLACEMENT (IOC) RIGHT DIABETIC 9.43, 00:53;  Surgeon: Birder Robson, MD;  Location: [place];  Service: Ophthalmology;  Laterality: Right;   CHOLECYSTECTOMY     COLONOSCOPY WITH PROPOFOL N/A 01/08/2020   Procedure: COLONOSCOPY WITH PROPOFOL;  Surgeon: Lin Landsman, MD;  Location: [place];  Service: Gastroenterology;  Laterality: N/A;   MASTECTOMY Right 2001   chemo, rad   OOPHORECTOMY     POLYPECTOMY  01/08/2020   Procedure: POLYPECTOMY;  Surgeon: Lin Landsman, MD;  Location: [place];  Service: Gastroenterology;;   TUBAL LIGATION     Family History  Problem  Relation Age of Onset   Arthritis Mother    Stroke Mother 24   Arthritis Sister    Stroke Sister 27       paralyzed left side   Breast cancer Sister 40   Cancer Sister        breast   Heart disease Brother    Asthma Brother    Colon cancer Brother    Lung cancer Son    Breast cancer Other    Breast cancer Cousin        maternal - 53's    Allergies: Fluoxetine Current Outpatient Medications on File Prior to Visit  Medication Sig Dispense Refill   amLODipine (NORVASC) 5 MG tablet TAKE 1 TABLET BY MOUTH ONCE DAILY 90 tablet 3   buPROPion (WELLBUTRIN XL) 300 MG 24 hr tablet TAKE 1 TABLET BY MOUTH EVERY MORNING 90 tablet 1   calcium carbonate (CALCIUM 600) 1500 (600 Ca) MG TABS tablet Take 600 mg of elemental calcium by mouth daily with breakfast.     Cholecalciferol (VITAMIN D) 2000 units tablet Take 1 tablet by mouth daily.     cyclobenzaprine (FLEXERIL) 5 MG tablet TAKE 1 TABLET BY MOUTH EVERY NIGHT AT BEDTIME AS NEEDED FOR MUSCLE SPASMS 30 tablet 0   losartan (COZAAR) 100 MG tablet TAKE 1 TABLET BY MOUTH ONCE DAILY 90 tablet 3   Multiple Vitamin (MULTIVITAMIN) tablet Take 1 tablet by  mouth daily.     naproxen sodium (ANAPROX) 220 MG tablet Take 220 mg by mouth 2 (two) times daily with a meal.     sertraline (ZOLOFT) 100 MG tablet TAKE 1 TABLET BY MOUTH EVERY NIGHT AT BEDTIME 90 tablet 1   No current facility-administered medications on file prior to visit.    Social History   Tobacco Use   Smoking status: Former    Pack years: 0.00    Types: Cigarettes    Quit date: 04/10/1997    Years since quitting: 24.1   Smokeless tobacco: Never  Vaping Use   Vaping Use: Never used  Substance Use Topics   Alcohol use: Not Currently    Alcohol/week: 1.0 standard drink    Types: 1 Glasses of wine per week   Drug use: No    Review of Systems  Constitutional:  Negative for chills and fever.  Respiratory:  Negative for cough.   Cardiovascular:  Negative for chest pain and  palpitations.  Gastrointestinal:  Negative for nausea and vomiting.     Objective:    BP 126/70 (BP Location: Left Arm, Patient Position: Sitting, Cuff Size: Large)   Pulse 76   Temp 97.9 F (36.6 C) (Oral)   Ht 5' 5.5" (1.664 m)   Wt 189 lb 9.6 oz (86 kg)   SpO2 99%   BMI 31.07 kg/m  BP Readings from Last 3 Encounters:  06/01/21 126/70  01/30/21 120/82  10/01/20 120/78   Wt Readings from Last 3 Encounters:  06/01/21 189 lb 9.6 oz (86 kg)  01/30/21 190 lb 9.6 oz (86.5 kg)  10/01/20 196 lb 12.8 oz (89.3 kg)    Physical Exam Vitals reviewed.  Constitutional:      Appearance: She is well-developed.  Eyes:     Conjunctiva/sclera: Conjunctivae normal.  Cardiovascular:     Rate and Rhythm: Normal rate and regular rhythm.     Pulses: Normal pulses.     Heart sounds: Normal heart sounds.  Pulmonary:     Effort: Pulmonary effort is normal.     Breath sounds: Normal breath sounds. No wheezing, rhonchi or rales.  Skin:    General: Skin is warm and dry.  Neurological:     Mental Status: She is alert.  Psychiatric:        Speech: Speech normal.        Behavior: Behavior normal.        Thought Content: Thought content normal.       Assessment & Plan:   Problem List Items Addressed This Visit       Cardiovascular and Mediastinum   Essential hypertension, benign   Relevant Medications   hydrochlorothiazide (MICROZIDE) 12.5 MG capsule   metoprolol succinate (TOPROL-XL) 50 MG 24 hr tablet   simvastatin (ZOCOR) 20 MG tablet   Other Relevant Orders   Basic metabolic panel   HTN (hypertension)    Excellent control. Continue hctz 12.5mg , amlodipine 5mg , losartan 100mg ,  toprol 50mg        Relevant Medications   hydrochlorothiazide (MICROZIDE) 12.5 MG capsule   metoprolol succinate (TOPROL-XL) 50 MG 24 hr tablet   simvastatin (ZOCOR) 20 MG tablet     Other   Aspirin long-term use    We agreed that reasonable to stop ASA 81mg  for primary prevention in the absence of  h/o CAD. Advised we can re establish with Dr Michelle Clark and she will let me know if she would like to do this.  Depression, recurrent ([facility])    Chronic, stable. Continue wellbutrin 300mg  and zoloft 100mg        Hyperlipidemia - Primary    Controlled. Continue zocor 20mg        Relevant Medications   hydrochlorothiazide (MICROZIDE) 12.5 MG capsule   metoprolol succinate (TOPROL-XL) 50 MG 24 hr tablet   simvastatin (ZOCOR) 20 MG tablet     I have discontinued Dezarai B. Huelsmann's aspirin. I have also changed her hydrochlorothiazide and simvastatin. Additionally, I am having her maintain her multivitamin, naproxen sodium, Vitamin D, cyclobenzaprine, amLODipine, losartan, buPROPion, sertraline, calcium carbonate, and metoprolol succinate.   Meds ordered this encounter  Medications   hydrochlorothiazide (MICROZIDE) 12.5 MG capsule    Sig: Take 1 capsule (12.5 mg total) by mouth daily.    Dispense:  90 capsule    Refill:  1    Order Specific Question:   Supervising Provider    Answer:   Deborra Medina L [[id number]]   metoprolol succinate (TOPROL-XL) 50 MG 24 hr tablet    Sig: TAKE 1 TABLET BY MOUTH ONCE A DAY IMMEDIATELY FOLLOWING A MEAL    Dispense:  90 tablet    Refill:  3    Order Specific Question:   Supervising Provider    Answer:   Crecencio Mc [[id number]]   simvastatin (ZOCOR) 20 MG tablet    Sig: Take 1 tablet (20 mg total) by mouth daily.    Dispense:  90 tablet    Refill:  3    Order Specific Question:   Supervising Provider    Answer:   Crecencio Mc [[id number]]    Return precautions given.   Risks, benefits, and alternatives of the medications and treatment plan prescribed today were discussed, and patient expressed understanding.   Education regarding symptom management and diagnosis given to patient on AVS.  Continue to follow with Burnard Hawthorne, FNP for routine health maintenance.   Michelle Clark and I agreed with plan.   Mable Paris, FNP

## 2021-06-01 NOTE — Assessment & Plan Note (Signed)
Chronic, stable. Continue wellbutrin 300mg  and zoloft 100mg 

## 2021-06-01 NOTE — Assessment & Plan Note (Signed)
Controlled. Continue zocor 20mg 

## 2021-06-01 NOTE — Patient Instructions (Addendum)
You may stop aspirin as we discussed in the absence of known coronary artery disease.   Please let me know of any concerns or if you would like to re-establish with cardiology, Dr Clayborn Bigness.  Nice to see you!

## 2021-06-01 NOTE — Assessment & Plan Note (Signed)
We agreed that reasonable to stop ASA 81mg  for primary prevention in the absence of h/o CAD. Advised we can re establish with Dr Clayborn Bigness and she will let me know if she would like to do this.

## 2021-06-01 NOTE — Assessment & Plan Note (Signed)
Excellent control. Continue hctz 12.5mg , amlodipine 5mg , losartan 100mg ,  toprol 50mg 

## 2021-06-02 LAB — BASIC METABOLIC PANEL
BUN: 15 mg/dL (ref 6–23)
CO2: 26 mEq/L (ref 19–32)
Calcium: 9.6 mg/dL (ref 8.4–10.5)
Chloride: 100 mEq/L (ref 96–112)
Creatinine, Ser: 0.98 mg/dL (ref 0.40–1.20)
GFR: 56.1 mL/min — ABNORMAL LOW (ref >60.00)
Glucose, Bld: 72 mg/dL (ref 70–99)
Potassium: 3.9 mEq/L (ref 3.5–5.1)
Sodium: 135 mEq/L (ref 135–145)

## 2021-06-05 ENCOUNTER — Other Ambulatory Visit: Payer: Self-pay | Admitting: Family

## 2021-06-05 DIAGNOSIS — I10 Essential (primary) hypertension: Secondary | ICD-10-CM

## 2021-07-23 ENCOUNTER — Emergency Department: Payer: No Typology Code available for payment source

## 2021-07-23 ENCOUNTER — Emergency Department
Admission: EM | Admit: 2021-07-23 | Discharge: 2021-07-23 | Disposition: A | Discharge status: Home / Self Care | Payer: No Typology Code available for payment source | Attending: Emergency Medicine | Admitting: Emergency Medicine

## 2021-07-23 ENCOUNTER — Encounter: Payer: Self-pay | Admitting: *Deleted

## 2021-07-23 ENCOUNTER — Other Ambulatory Visit: Payer: Self-pay

## 2021-07-23 DIAGNOSIS — M7732 Calcaneal spur, left foot: Secondary | ICD-10-CM | POA: Diagnosis not present

## 2021-07-23 DIAGNOSIS — I10 Essential (primary) hypertension: Secondary | ICD-10-CM | POA: Diagnosis not present

## 2021-07-23 DIAGNOSIS — R0789 Other chest pain: Secondary | ICD-10-CM | POA: Insufficient documentation

## 2021-07-23 DIAGNOSIS — Y92410 Unspecified street and highway as the place of occurrence of the external cause: Secondary | ICD-10-CM | POA: Diagnosis not present

## 2021-07-23 DIAGNOSIS — M79662 Pain in left lower leg: Secondary | ICD-10-CM | POA: Diagnosis not present

## 2021-07-23 DIAGNOSIS — Z853 Personal history of malignant neoplasm of breast: Secondary | ICD-10-CM | POA: Insufficient documentation

## 2021-07-23 DIAGNOSIS — M25512 Pain in left shoulder: Secondary | ICD-10-CM | POA: Diagnosis not present

## 2021-07-23 DIAGNOSIS — Z79899 Other long term (current) drug therapy: Secondary | ICD-10-CM | POA: Insufficient documentation

## 2021-07-23 DIAGNOSIS — M79672 Pain in left foot: Secondary | ICD-10-CM | POA: Insufficient documentation

## 2021-07-23 DIAGNOSIS — Z87891 Personal history of nicotine dependence: Secondary | ICD-10-CM | POA: Diagnosis not present

## 2021-07-23 DIAGNOSIS — Z041 Encounter for examination and observation following transport accident: Secondary | ICD-10-CM | POA: Diagnosis not present
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
## 2021-07-23 NOTE — Discharge Instructions (Addendum)
You can take Tylenol for pain.
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
## 2021-07-24 ENCOUNTER — Telehealth: Payer: Self-pay | Admitting: *Deleted

## 2021-07-24 NOTE — Telephone Encounter (Signed)
Transition Care Management Follow-up Telephone Call Date of discharge and from where: 07/23/2021 [facility] ED How have you been since you were released from the hospital? "Really sore" Any questions or concerns? No  Items Reviewed: Did the pt receive and understand the discharge instructions provided? Yes  Medications obtained and verified? Yes  Other? No  Any new allergies since your discharge? No  Dietary orders reviewed? No Do you have support at home? Yes    Functional Questionnaire: (I = Independent and D = Dependent) ADLs: I  Bathing/Dressing- I  Meal Prep- I  Eating- I  Maintaining continence- I  Transferring/Ambulation- I  Managing Meds- I  Follow up appointments reviewed:  PCP Hospital f/u appt confirmed? No   Specialist Hospital f/u appt confirmed? No   Are transportation arrangements needed? No  If their condition worsens, is the pt aware to call PCP or go to the Emergency Dept.? Yes Was the patient provided with contact information for the PCP's office or ED? Yes Was to pt encouraged to call back with questions or concerns? Yes

## 2021-09-21 ENCOUNTER — Other Ambulatory Visit: Payer: Self-pay | Admitting: Family

## 2021-09-21 DIAGNOSIS — F339 Major depressive disorder, recurrent, unspecified: Secondary | ICD-10-CM

## 2021-09-30 ENCOUNTER — Ambulatory Visit (INDEPENDENT_AMBULATORY_CARE_PROVIDER_SITE_OTHER): Payer: Medicare Other

## 2021-09-30 VITALS — Ht 65.5 in | Wt 189.0 lb

## 2021-09-30 DIAGNOSIS — Z0000 Encounter for general adult medical examination without abnormal findings: Secondary | ICD-10-CM

## 2021-09-30 NOTE — Progress Notes (Signed)
Subjective:   Michelle Clark is a 75 y.o. female who presents for Medicare Annual (Subsequent) preventive examination.  Review of Systems    No ROS.  Medicare Wellness Virtual Visit.  Visual/audio telehealth visit, UTA vital signs.   See social history for additional risk factors.   Cardiac Risk Factors include: advanced age (>2men, >26 women)     Objective:    Today's Vitals   09/30/21 0828  Weight: 189 lb (85.7 kg)  Height: 5' 5.5" (1.664 m)   Body mass index is 30.97 kg/m.  Advanced Directives 09/30/2021 07/23/2021 09/29/2020 01/08/2020 10/09/2019 09/28/2019 09/25/2018  Does Patient Have a Medical Advance Directive? Yes No Yes Yes Yes Yes Yes  Type of Paramedic of [place];Living will - [place];Living will Living will Living will [place];Living will Living will  Does patient want to make changes to medical advance directive? No - Patient declined - No - Patient declined No - Patient declined No - Patient declined No - Patient declined -  Copy of [place] in Chart? Yes - validated most recent copy scanned in chart (See row information) - Yes - validated most recent copy scanned in chart (See row information) - Yes - validated most recent copy scanned in chart (See row information) Yes - validated most recent copy scanned in chart (See row information) -  Would patient like information on creating a medical advance directive? - Yes (ED - Information included in AVS) - - - - -    Current Medications (verified) Outpatient Encounter Medications as of 09/30/2021  Medication Sig   amLODipine (NORVASC) 5 MG tablet TAKE 1 TABLET BY MOUTH ONCE DAILY   buPROPion (WELLBUTRIN XL) 300 MG 24 hr tablet TAKE 1 TABLET BY MOUTH EVERY MORNING   calcium carbonate (CALCIUM 600) 1500 (600 Ca) MG TABS tablet Take 600 mg of elemental calcium by mouth daily with breakfast.   Cholecalciferol (VITAMIN D) 2000 units tablet  Take 1 tablet by mouth daily.   cyclobenzaprine (FLEXERIL) 5 MG tablet TAKE 1 TABLET BY MOUTH EVERY NIGHT AT BEDTIME AS NEEDED FOR MUSCLE SPASMS   hydrochlorothiazide (MICROZIDE) 12.5 MG capsule Take 1 capsule (12.5 mg total) by mouth daily.   losartan (COZAAR) 100 MG tablet TAKE 1 TABLET BY MOUTH ONCE DAILY   metoprolol succinate (TOPROL-XL) 50 MG 24 hr tablet TAKE 1 TABLET BY MOUTH ONCE A DAY IMMEDIATELY FOLLOWING A MEAL   Multiple Vitamin (MULTIVITAMIN) tablet Take 1 tablet by mouth daily.   naproxen sodium (ANAPROX) 220 MG tablet Take 220 mg by mouth 2 (two) times daily with a meal.   sertraline (ZOLOFT) 100 MG tablet TAKE 1 TABLET BY MOUTH EVERY NIGHT AT BEDTIME   simvastatin (ZOCOR) 20 MG tablet Take 1 tablet (20 mg total) by mouth daily.   No facility-administered encounter medications on file as of 09/30/2021.    Allergies (verified) Fluoxetine   History: Past Medical History:  Diagnosis Date   Cancer ([place]) 2001   Right breast, s/p chemotherapy, XRT and mastectomy, Dr. Jeb Levering   Colon polyps    Hyperlipidemia    Hypertension    Personal history of chemotherapy    Personal history of radiation therapy    Pre-diabetes    Past Surgical History:  Procedure Laterality Date   ABDOMINAL HYSTERECTOMY  1998   cervix intact   APPENDECTOMY     BREAST SURGERY  2002   mastectomy   CATARACT EXTRACTION W/PHACO Right 10/09/2019  Procedure: CATARACT EXTRACTION PHACO AND INTRAOCULAR LENS PLACEMENT (IOC) RIGHT DIABETIC 9.43, 00:53;  Surgeon: Birder Robson, MD;  Location: [facility];  Service: Ophthalmology;  Laterality: Right;   CHOLECYSTECTOMY     COLONOSCOPY WITH PROPOFOL N/A 01/08/2020   Procedure: COLONOSCOPY WITH PROPOFOL;  Surgeon: Lin Landsman, MD;  Location: [facility];  Service: Gastroenterology;  Laterality: N/A;   MASTECTOMY Right 2001   chemo, rad   OOPHORECTOMY     POLYPECTOMY  01/08/2020   Procedure: POLYPECTOMY;  Surgeon: Lin Landsman,  MD;  Location: [facility];  Service: Gastroenterology;;   TUBAL LIGATION     Family History  Problem Relation Age of Onset   Arthritis Mother    Stroke Mother 42   Arthritis Sister    Stroke Sister 10       paralyzed left side   Breast cancer Sister 59   Cancer Sister        breast   Heart disease Brother    Asthma Brother    Colon cancer Brother    Lupus Daughter    Lung cancer Son    Breast cancer Cousin        maternal - 92's   Breast cancer Other    Social History   Socioeconomic History   Marital status: Married    Spouse name: Not on file   Number of children: Not on file   Years of education: Not on file   Highest education level: Not on file  Occupational History   Not on file  Tobacco Use   Smoking status: Former    Types: Cigarettes    Quit date: 04/10/1997    Years since quitting: 24.4   Smokeless tobacco: Never  Vaping Use   Vaping Use: Never used  Substance and Sexual Activity   Alcohol use: Not Currently    Alcohol/week: 1.0 standard drink    Types: 1 Glasses of wine per week   Drug use: No   Sexual activity: Not Currently  Other Topics Concern   Not on file  Social History Narrative   Lives in [place] with husband. No pets      Work - retired, Surveyor, minerals      Diet - regular      Exercise - none   Social Determinants of Radio broadcast assistant Strain: Low Risk    Difficulty of Paying Living Expenses: Not hard at all  Food Insecurity: No Food Insecurity   Worried About Charity fundraiser in the Last Year: Never true   Arboriculturist in the Last Year: Never true  Transportation Needs: No Transportation Needs   Lack of Transportation (Medical): No   Lack of Transportation (Non-Medical): No  Physical Activity: Insufficiently Active   Days of Exercise per Week: 2 days   Minutes of Exercise per Session: 20 min  Stress: No Stress Concern Present   Feeling of Stress : Not at all  Social Connections: Unknown    Frequency of Communication with Friends and Family: More than three times a week   Frequency of Social Gatherings with Friends and Family: More than three times a week   Attends Religious Services: Not on Electrical engineer or Organizations: Not on file   Attends Archivist Meetings: Not on file   Marital Status: Married    Tobacco Counseling Counseling given: Not Answered   Clinical Intake:  Pre-visit preparation completed: Yes  Diabetes: No  How often do you need to have someone help you when you read instructions, pamphlets, or other written materials from your doctor or pharmacy?: 1 - Never  Interpreter Needed?: No      Activities of Daily Living In your present state of health, do you have any difficulty performing the following activities: 09/30/2021  Hearing? N  Vision? N  Difficulty concentrating or making decisions? N  Walking or climbing stairs? N  Dressing or bathing? N  Doing errands, shopping? N  Preparing Food and eating ? N  Using the Toilet? N  In the past six months, have you accidently leaked urine? N  Do you have problems with loss of bowel control? N  Managing your Medications? N  Managing your Finances? N  Housekeeping or managing your Housekeeping? N  Some recent data might be hidden    Patient Care Team: Burnard Hawthorne, FNP as PCP - General (Family Medicine)  Indicate any recent Medical Services you may have received from other than [facility] providers in the past year (date may be approximate).     Assessment:   This is a routine wellness examination for [place].  Virtual Visit via Telephone Note  I connected with  Michelle Clark on 09/30/21 at  8:15 AM EST by telephone and verified that I am speaking with the correct person using two identifiers.  Location: Patient: home Provider: office Persons participating in the virtual visit: patient/Nurse Health Advisor   I discussed the limitations, risks,  security and privacy concerns of performing an evaluation and management service by telephone and the availability of in person appointments. The patient expressed understanding and agreed to proceed.  Interactive audio and video telecommunications were attempted between this nurse and patient, however failed, due to patient having technical difficulties OR patient did not have access to video capability.  We continued and completed visit with audio only.  Some vital signs may be absent or patient reported.   OBrien-Blaney, Darrick Greenlaw L, LPN  Hearing/Vision screen Hearing Screening - Comments:: Patient is able to hear conversational tones without difficulty. No issues reported. Vision Screening - Comments:: Followed by [facility] [place]  Wears corrective lenses Cataract extracted, R eye only  They have regular follow up with the ophthalmologist.  Dietary issues and exercise activities discussed: Current Exercise Habits: Home exercise routine, Type of exercise: walking, Intensity: Mild Healthy diet Good water intake  Goals Addressed               This Visit's Progress     Patient Stated     Low carb diet (pt-stated)   On track     Monitor sugar intake      Other     I would like to continue losing weight        Stay active Healthy diet       Depression Screen PHQ 2/9 Scores 09/30/2021 06/01/2021 01/30/2021 10/01/2020 09/29/2020 09/28/2019 05/09/2019  PHQ - 2 Score 0 0 2 0 0 0 4  PHQ- 9 Score - 0 5 0 - - 6    Fall Risk Fall Risk  09/30/2021 09/29/2020 09/28/2019 09/25/2018 09/15/2017  Falls in the past year? 0 0 0 0 No  Number falls in past yr: 0 0 - - -  Injury with Fall? - 0 - - -  Follow up Falls evaluation completed Falls evaluation completed - - -    FALL RISK PREVENTION PERTAINING TO THE HOME: Adequate lighting in your home to reduce  risk of falls? Yes   ASSISTIVE DEVICES UTILIZED TO PREVENT FALLS: Life alert? No  Use of a cane, walker or w/c? No  Grab bars in the  bathroom? Yes  Shower chair or bench in shower? Yes  Chair height toilet seat? Yes  TIMED UP AND GO: Was the test performed? No .   Cognitive Function: Patient is alert and oriented x3.  MMSE - Mini Mental State Exam 09/15/2017  Orientation to time 5  Orientation to Place 5  Registration 3  Attention/ Calculation 5  Recall 3  Language- name 2 objects 2  Language- repeat 1  Language- follow 3 step command 3  Language- read & follow direction 1  Write a sentence 1  Copy design 1  Total score 30     6CIT Screen 09/30/2021 09/29/2020 09/28/2019 09/25/2018  What Year? 0 points 0 points 0 points 0 points  What month? 0 points 0 points 0 points 0 points  What time? 0 points 0 points 0 points 0 points  Count back from 20 0 points - 0 points 0 points  Months in reverse 0 points 0 points 0 points 0 points  Repeat phrase 0 points 0 points 0 points 0 points  Total Score 0 - 0 0    Immunizations Immunization History  Administered Date(s) Administered   Fluad Quad(high Dose 65+) 08/01/2019, 10/01/2020   Influenza, High Dose Seasonal PF 09/15/2016, 09/15/2017, 09/25/2018   Influenza,inj,Quad PF,6+ Mos 10/15/2013, 09/02/2014, 09/16/2015   Influenza-Unspecified 09/10/2012   Moderna Sars-Covid-2 Vaccination 01/19/2020, 02/16/2020   Pneumococcal Conjugate-13 04/19/2014   Pneumococcal Polysaccharide-23 04/10/2013   Tdap 09/15/2016   Zoster, Live 04/11/2011   Shingrix Completed?: No.    Education has been provided regarding the importance of this vaccine. Patient has been advised to call insurance company to determine out of pocket expense if they have not yet received this vaccine. Advised may also receive vaccine at local pharmacy or Health Dept. Verbalized acceptance and understanding.  Screening Tests Health Maintenance  Topic Date Due   INFLUENZA VACCINE  10/05/2021 (Originally 06/22/2021)   COVID-19 Vaccine (3 - Moderna risk series) 10/16/2021 (Originally 03/15/2020)   Zoster  Vaccines- Shingrix (1 of 2) 12/31/2021 (Originally 01/03/1965)   COLONOSCOPY (Pts 45-26yrs Insurance coverage will need to be confirmed)  01/07/2025   TETANUS/TDAP  09/15/2026   Pneumonia Vaccine 76+ Years old  Completed   DEXA SCAN  Completed   Hepatitis C Screening  Completed   HPV VACCINES  Aged Out   Health Maintenance There are no preventive care reminders to display for this patient.  Mammogram status: Completed 01/01/21. Repeat every year  Lung Cancer Screening: (Low Dose CT Chest recommended if Age 64-80 years, 30 pack-year currently smoking OR have quit w/in 15years.) does not qualify.   Vision Screening: Recommended annual ophthalmology exams for early detection of glaucoma and other disorders of the eye.  Dental Screening: Recommended annual dental exams for proper oral hygiene  Community Resource Referral / Chronic Care Management: CRR required this visit?  No   CCM required this visit?  No      Plan:   Keep all routine maintenance appointments.   I have personally reviewed and noted the following in the patient's chart:   Medical and social history Use of alcohol, tobacco or illicit drugs  Current medications and supplements including opioid prescriptions. Not taking opioid.  Functional ability and status Nutritional status Physical activity Advanced directives List of other physicians Hospitalizations, surgeries, and ER visits in previous  12 months Vitals Screenings to include cognitive, depression, and falls Referrals and appointments  In addition, I have reviewed and discussed with patient certain preventive protocols, quality metrics, and best practice recommendations. A written personalized care plan for preventive services as well as general preventive health recommendations were provided to patient.     Varney Biles, LPN   14/05/928

## 2021-09-30 NOTE — Patient Instructions (Addendum)
[name] , Thank you for taking time to come for your Medicare Wellness Visit. I appreciate your ongoing commitment to your health goals. Please review the following plan we discussed and let me know if I can assist you in the future.   These are the goals we discussed:  Goals       Patient Stated     Low carb diet (pt-stated)      Monitor sugar intake      Other     I would like to continue losing weight      Stay active Healthy diet        This is a list of the screening recommended for you and due dates:  Health Maintenance  Topic Date Due   Flu Shot  10/05/2021*   COVID-19 Vaccine (3 - Moderna risk series) 10/16/2021*   Zoster (Shingles) Vaccine (1 of 2) 12/31/2021*   Colon Cancer Screening  01/07/2025   Tetanus Vaccine  09/15/2026   Pneumonia Vaccine  Completed   DEXA scan (bone density measurement)  Completed   Hepatitis C Screening: USPSTF Recommendation to screen - Ages 22-79 yo.  Completed   HPV Vaccine  Aged Out  *Topic was postponed. The date shown is not the original due date.    Advanced directives: on file  Conditions/risks identified: none new  Follow up in one year for your annual wellness visit    Preventive Care 65 Years and Older, Female Preventive care refers to lifestyle choices and visits with your health care provider that can promote health and wellness. What does preventive care include? A yearly physical exam. This is also called an annual well check. Dental exams once or twice a year. Routine eye exams. Ask your health care provider how often you should have your eyes checked. Personal lifestyle choices, including: Daily care of your teeth and gums. Regular physical activity. Eating a healthy diet. Avoiding tobacco and drug use. Limiting alcohol use. Practicing safe sex. Taking low-dose aspirin every day. Taking vitamin and mineral supplements as recommended by your health care provider. What happens during an annual well  check? The services and screenings done by your health care provider during your annual well check will depend on your age, overall health, lifestyle risk factors, and family history of disease. Counseling  Your health care provider may ask you questions about your: Alcohol use. Tobacco use. Drug use. Emotional well-being. Home and relationship well-being. Sexual activity. Eating habits. History of falls. Memory and ability to understand (cognition). Work and work Statistician. Reproductive health. Screening  You may have the following tests or measurements: Height, weight, and BMI. Blood pressure. Lipid and cholesterol levels. These may be checked every 5 years, or more frequently if you are over 15 years old. Skin check. Lung cancer screening. You may have this screening every year starting at age 16 if you have a 30-pack-year history of smoking and currently smoke or have quit within the past 15 years. Fecal occult blood test (FOBT) of the stool. You may have this test every year starting at age 37. Flexible sigmoidoscopy or colonoscopy. You may have a sigmoidoscopy every 5 years or a colonoscopy every 10 years starting at age 52. Hepatitis C blood test. Hepatitis B blood test. Sexually transmitted disease (STD) testing. Diabetes screening. This is done by checking your blood sugar (glucose) after you have not eaten for a while (fasting). You may have this done every 1-3 years. Bone density scan. This is done to screen for  osteoporosis. You may have this done starting at age 30. Mammogram. This may be done every 1-2 years. Talk to your health care provider about how often you should have regular mammograms. Talk with your health care provider about your test results, treatment options, and if necessary, the need for more tests. Vaccines  Your health care provider may recommend certain vaccines, such as: Influenza vaccine. This is recommended every year. Tetanus, diphtheria, and  acellular pertussis (Tdap, Td) vaccine. You may need a Td booster every 10 years. Zoster vaccine. You may need this after age 28. Pneumococcal 13-valent conjugate (PCV13) vaccine. One dose is recommended after age 43. Pneumococcal polysaccharide (PPSV23) vaccine. One dose is recommended after age 61. Talk to your health care provider about which screenings and vaccines you need and how often you need them. This information is not intended to replace advice given to you by your health care provider. Make sure you discuss any questions you have with your health care provider. Document Released: 12/05/2015 Document Revised: 07/28/2016 Document Reviewed: 09/09/2015 Elsevier Interactive Patient Education  2017 [place] Prevention in the Home Falls can cause injuries. They can happen to people of all ages. There are many things you can do to make your home safe and to help prevent falls. What can I do on the outside of my home? Regularly fix the edges of walkways and driveways and fix any cracks. Remove anything that might make you trip as you walk through a door, such as a raised step or threshold. Trim any bushes or trees on the path to your home. Use bright outdoor lighting. Clear any walking paths of anything that might make someone trip, such as rocks or tools. Regularly check to see if handrails are loose or broken. Make sure that both sides of any steps have handrails. Any raised decks and porches should have guardrails on the edges. Have any leaves, snow, or ice cleared regularly. Use sand or salt on walking paths during winter. Clean up any spills in your garage right away. This includes oil or grease spills. What can I do in the bathroom? Use night lights. Install grab bars by the toilet and in the tub and shower. Do not use towel bars as grab bars. Use non-skid mats or decals in the tub or shower. If you need to sit down in the shower, use a plastic, non-slip stool. Keep  the floor dry. Clean up any water that spills on the floor as soon as it happens. Remove soap buildup in the tub or shower regularly. Attach bath mats securely with double-sided non-slip rug tape. Do not have throw rugs and other things on the floor that can make you trip. What can I do in the bedroom? Use night lights. Make sure that you have a light by your bed that is easy to reach. Do not use any sheets or blankets that are too big for your bed. They should not hang down onto the floor. Have a firm chair that has side arms. You can use this for support while you get dressed. Do not have throw rugs and other things on the floor that can make you trip. What can I do in the kitchen? Clean up any spills right away. Avoid walking on wet floors. Keep items that you use a lot in easy-to-reach places. If you need to reach something above you, use a strong step stool that has a grab bar. Keep electrical cords out of the way. Do not  use floor polish or wax that makes floors slippery. If you must use wax, use non-skid floor wax. Do not have throw rugs and other things on the floor that can make you trip. What can I do with my stairs? Do not leave any items on the stairs. Make sure that there are handrails on both sides of the stairs and use them. Fix handrails that are broken or loose. Make sure that handrails are as long as the stairways. Check any carpeting to make sure that it is firmly attached to the stairs. Fix any carpet that is loose or worn. Avoid having throw rugs at the top or bottom of the stairs. If you do have throw rugs, attach them to the floor with carpet tape. Make sure that you have a light switch at the top of the stairs and the bottom of the stairs. If you do not have them, ask someone to add them for you. What else can I do to help prevent falls? Wear shoes that: Do not have high heels. Have rubber bottoms. Are comfortable and fit you well. Are closed at the toe. Do not  wear sandals. If you use a stepladder: Make sure that it is fully opened. Do not climb a closed stepladder. Make sure that both sides of the stepladder are locked into place. Ask someone to hold it for you, if possible. Clearly mark and make sure that you can see: Any grab bars or handrails. First and last steps. Where the edge of each step is. Use tools that help you move around (mobility aids) if they are needed. These include: Canes. Walkers. Scooters. Crutches. Turn on the lights when you go into a dark area. Replace any light bulbs as soon as they burn out. Set up your furniture so you have a clear path. Avoid moving your furniture around. If any of your floors are uneven, fix them. If there are any pets around you, be aware of where they are. Review your medicines with your doctor. Some medicines can make you feel dizzy. This can increase your chance of falling. Ask your doctor what other things that you can do to help prevent falls. This information is not intended to replace advice given to you by your health care provider. Make sure you discuss any questions you have with your health care provider. Document Released: 09/04/2009 Document Revised: 04/15/2016 Document Reviewed: 12/13/2014 Elsevier Interactive Patient Education  2017 [employer].

## 2021-10-02 ENCOUNTER — Ambulatory Visit: Payer: Medicare Other | Admitting: Family

## 2021-10-05 ENCOUNTER — Encounter: Payer: Self-pay | Admitting: Family

## 2021-10-05 ENCOUNTER — Other Ambulatory Visit: Payer: Self-pay

## 2021-10-05 ENCOUNTER — Ambulatory Visit (INDEPENDENT_AMBULATORY_CARE_PROVIDER_SITE_OTHER): Payer: Medicare Other | Admitting: Family

## 2021-10-05 DIAGNOSIS — B351 Tinea unguium: Secondary | ICD-10-CM

## 2021-10-05 DIAGNOSIS — F339 Major depressive disorder, recurrent, unspecified: Secondary | ICD-10-CM

## 2021-10-05 DIAGNOSIS — I10 Essential (primary) hypertension: Secondary | ICD-10-CM

## 2021-10-05 DIAGNOSIS — L299 Pruritus, unspecified: Secondary | ICD-10-CM

## 2021-10-05 MED ORDER — BUPROPION HCL ER (XL) 300 MG PO TB24: 300.0000 mg | ORAL_TABLET | Freq: Every morning | ORAL | 1 refills | Status: DC

## 2021-10-05 NOTE — Assessment & Plan Note (Signed)
Slightly elevated 130/80 however reports average to be closer to 120/60.  Education provided on monitoring blood pressure and to bring BP cuff to follow up. Counseled on low salt diet and to call me if BP were consistently greater than 130/80.

## 2021-10-05 NOTE — Patient Instructions (Addendum)
Trial of azelastine for ear itching  It is imperative that you are seen AT least twice per year for labs and monitoring. Monitor blood pressure at home and me 5-6 reading on separate days. Goal is less than 120/80, based on newest guidelines, however we certainly want to be less than 130/80;  if persistently higher, please make sooner follow up appointment so we can recheck you blood pressure and manage/ adjust medications.

## 2021-10-05 NOTE — Assessment & Plan Note (Signed)
Unfortunately unable to assess in person.  Symptoms are most consistent with onychomycosis.  Referral to podiatry for further evaluation, confirmation
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
## 2021-10-07 DIAGNOSIS — L299 Pruritus, unspecified: Secondary | ICD-10-CM | POA: Insufficient documentation

## 2021-10-07 NOTE — Assessment & Plan Note (Signed)
Unable to examine in person.  Advised over-the-counter azelastine to start.  Patient will return for an office visit, may prescribe Elocon cream if presentation consistent with atopic dermatitis

## 2021-10-14 ENCOUNTER — Ambulatory Visit: Payer: Medicare Other | Admitting: Family

## 2021-10-19 ENCOUNTER — Ambulatory Visit (INDEPENDENT_AMBULATORY_CARE_PROVIDER_SITE_OTHER): Payer: Medicare Other | Admitting: Podiatry

## 2021-10-19 ENCOUNTER — Other Ambulatory Visit: Payer: Self-pay

## 2021-10-19 ENCOUNTER — Encounter: Payer: Self-pay | Admitting: Podiatry

## 2021-10-19 VITALS — BP 124/60 | HR 75 | Temp 97.3°F | Resp 16

## 2021-10-19 DIAGNOSIS — B351 Tinea unguium: Secondary | ICD-10-CM

## 2021-10-19 MED ORDER — TERBINAFINE HCL 250 MG PO TABS: 250.0000 mg | ORAL_TABLET | Freq: Every day | ORAL | 0 refills | Status: DC

## 2021-10-19 NOTE — Progress Notes (Signed)
  Subjective:  Patient ID: Michelle Clark, female    DOB: 06/04/1946,  MRN: [id number]  Chief Complaint  Patient presents with   Nail Problem    "My toenail looks funny."    75 y.o. female presents with the above complaint. History confirmed with patient.  She notes changes in the color of the left hallux nail.  Is been going on for about 3 to 4 months but is gradually gotten worse.  It is thick brittle and discolored.  Not currently painful.  She is tried over-the-counter topical medications without success.  Objective:  Physical Exam: warm, good capillary refill, no trophic changes or ulcerative lesions, normal DP and PT pulses, and normal sensory exam. Left Foot: Mycotic hallux nail with yellow discoloration     Assessment:   1. Onychomycosis      Plan:  Patient was evaluated and treated and all questions answered.  Onychomycosis Discussed the treatment options of an etiology of onychomycosis.  We discussed oral and topical treatment.  Photographs were taken.  I recommended treatment with Lamisil.  Last LFTs reviewed and were normal.  No other history of liver disease.  She does take a statin I discussed the possibility of myalgia with her.  She will me know if she has any issues.  Return in 4 months after treatment.  Return in about 4 months (around 02/16/2022) for follow up after nail fungus treatment.

## 2021-10-26 ENCOUNTER — Ambulatory Visit (INDEPENDENT_AMBULATORY_CARE_PROVIDER_SITE_OTHER): Payer: Medicare Other | Admitting: Family

## 2021-10-26 ENCOUNTER — Other Ambulatory Visit: Payer: Self-pay

## 2021-10-26 VITALS — BP 130/78 | HR 74 | Temp 96.0°F | Ht 66.0 in | Wt 177.5 lb

## 2021-10-26 DIAGNOSIS — I10 Essential (primary) hypertension: Secondary | ICD-10-CM | POA: Diagnosis not present

## 2021-10-26 DIAGNOSIS — L299 Pruritus, unspecified: Secondary | ICD-10-CM | POA: Diagnosis not present

## 2021-10-26 DIAGNOSIS — Z23 Encounter for immunization: Secondary | ICD-10-CM | POA: Diagnosis not present

## 2021-10-26 LAB — COMPREHENSIVE METABOLIC PANEL
ALT: 25 U/L (ref 0–35)
AST: 24 U/L (ref 0–37)
Albumin: 4.5 g/dL (ref 3.5–5.2)
Alkaline Phosphatase: 81 U/L (ref 39–117)
BUN: 17 mg/dL (ref 6–23)
CO2: 28 mEq/L (ref 19–32)
Calcium: 9.7 mg/dL (ref 8.4–10.5)
Chloride: 102 mEq/L (ref 96–112)
Creatinine, Ser: 0.87 mg/dL (ref 0.40–1.20)
GFR: 64.54 mL/min (ref >60.00)
Glucose, Bld: 83 mg/dL (ref 70–99)
Potassium: 3.3 mEq/L — ABNORMAL LOW (ref 3.5–5.1)
Sodium: 138 mEq/L (ref 135–145)
Total Bilirubin: 0.5 mg/dL (ref 0.2–1.2)
Total Protein: 7.8 g/dL (ref 6.0–8.3)

## 2021-10-26 LAB — CBC WITH DIFFERENTIAL/PLATELET
Basophils Absolute: 0.1 10*3/uL (ref 0.0–0.1)
Basophils Relative: 0.8 % (ref 0.0–3.0)
Eosinophils Absolute: 0.1 10*3/uL (ref 0.0–0.7)
Eosinophils Relative: 1.5 % (ref 0.0–5.0)
HCT: 40.5 % (ref 36.0–46.0)
Hemoglobin: 13.2 g/dL (ref 12.0–15.0)
Lymphocytes Relative: 43.4 % (ref 12.0–46.0)
Lymphs Abs: 2.8 10*3/uL (ref 0.7–4.0)
MCHC: 32.6 g/dL (ref 30.0–36.0)
MCV: 86.8 fl (ref 78.0–100.0)
Monocytes Absolute: 0.6 10*3/uL (ref 0.1–1.0)
Monocytes Relative: 9.2 % (ref 3.0–12.0)
Neutro Abs: 2.9 10*3/uL (ref 1.4–7.7)
Neutrophils Relative %: 45.1 % (ref 43.0–77.0)
Platelets: 211 10*3/uL (ref 150.0–400.0)
RBC: 4.67 Mil/uL (ref 3.87–5.11)
RDW: 14.3 % (ref 11.5–15.5)
WBC: 6.5 10*3/uL (ref 4.0–10.5)

## 2021-10-26 LAB — VITAMIN D 25 HYDROXY (VIT D DEFICIENCY, FRACTURES): VITD: 64.76 ng/mL (ref 30.00–100.00)

## 2021-10-26 LAB — LIPID PANEL
Cholesterol: 165 mg/dL (ref 0–200)
HDL: 56.8 mg/dL (ref >39.00)
LDL Cholesterol: 92 mg/dL (ref 0–99)
NonHDL: 108.59
Total CHOL/HDL Ratio: 3
Triglycerides: 82 mg/dL (ref 0.0–149.0)
VLDL: 16.4 mg/dL (ref 0.0–40.0)

## 2021-10-26 LAB — TSH: TSH: 4.04 u[IU]/mL (ref 0.35–5.50)

## 2021-10-26 LAB — HEMOGLOBIN A1C: Hgb A1c MFr Bld: 5.8 % (ref 4.6–6.5)

## 2021-10-26 MED ORDER — AZELASTINE HCL 0.1 % NA SOLN: 1.0000 | Freq: Two times a day (BID) | NASAL | 4 refills | Status: DC

## 2021-10-26 NOTE — Assessment & Plan Note (Signed)
Chronic, stable. Continue hctz 12.5mg , amlodipine 5mg , losartan 100mg , toprol 50mg 

## 2021-10-26 NOTE — Assessment & Plan Note (Signed)
Resolved with azelastine.  She will continue

## 2021-10-26 NOTE — Progress Notes (Signed)
Subjective:    Patient ID: Michelle Clark, female    DOB: 1946/01/27, 75 y.o.   MRN: [id number]  CC: Michelle Clark is a 75 y.o. female who presents today for follow up.   HPI: Feels well today  No new complaints   HTN-compliant with hctz 12.5mg , amlodipine 5mg , losartan 100mg , toprol 50mg . BP at home 130/65. No cp, sob.      left calf bruise has largely resolved. No leg swelling, sob.   Ear itching- started azelastine at last visit over the phone with resolutions of symptoms.   She has seen podiatry regarding onychomycosis 10/19/21  Mammogram is up-to-date  HISTORY:  Past Medical History:  Diagnosis Date   Cancer [facility]) 2001   Right breast, s/p chemotherapy, XRT and mastectomy, Dr. Jeb Levering   Colon polyps    Hyperlipidemia    Hypertension    Personal history of chemotherapy    Personal history of radiation therapy    Pre-diabetes    Past Surgical History:  Procedure Laterality Date   ABDOMINAL HYSTERECTOMY  1998   cervix intact   APPENDECTOMY     BREAST SURGERY  2002   mastectomy   CATARACT EXTRACTION W/PHACO Right 10/09/2019   Procedure: CATARACT EXTRACTION PHACO AND INTRAOCULAR LENS PLACEMENT (IOC) RIGHT DIABETIC 9.43, 00:53;  Surgeon: Birder Robson, MD;  Location: [place];  Service: Ophthalmology;  Laterality: Right;   CHOLECYSTECTOMY     COLONOSCOPY WITH PROPOFOL N/A 01/08/2020   Procedure: COLONOSCOPY WITH PROPOFOL;  Surgeon: Lin Landsman, MD;  Location: [place];  Service: Gastroenterology;  Laterality: N/A;   MASTECTOMY Right 2001   chemo, rad   OOPHORECTOMY     POLYPECTOMY  01/08/2020   Procedure: POLYPECTOMY;  Surgeon: Lin Landsman, MD;  Location: [place];  Service: Gastroenterology;;   TUBAL LIGATION     Family History  Problem Relation Age of Onset   Arthritis Mother    Stroke Mother 48   Arthritis Sister    Stroke Sister 64       paralyzed left side   Breast cancer Sister 94   Cancer Sister         breast   Heart disease Brother    Asthma Brother    Colon cancer Brother    Lupus Daughter    Lung cancer Son    Breast cancer Cousin        maternal - 30's   Breast cancer Other     Allergies: Fluoxetine Current Outpatient Medications on File Prior to Visit  Medication Sig Dispense Refill   amLODipine (NORVASC) 5 MG tablet TAKE 1 TABLET BY MOUTH ONCE DAILY 90 tablet 3   buPROPion (WELLBUTRIN XL) 300 MG 24 hr tablet Take 1 tablet (300 mg total) by mouth every morning. 90 tablet 1   calcium carbonate (OSCAL) 1500 (600 Ca) MG TABS tablet Take 600 mg of elemental calcium by mouth daily with breakfast.     Cholecalciferol (VITAMIN D) 2000 units tablet Take 1 tablet by mouth daily.     cyclobenzaprine (FLEXERIL) 5 MG tablet TAKE 1 TABLET BY MOUTH EVERY NIGHT AT BEDTIME AS NEEDED FOR MUSCLE SPASMS 30 tablet 0   hydrochlorothiazide (MICROZIDE) 12.5 MG capsule Take 1 capsule (12.5 mg total) by mouth daily. 90 capsule 1   losartan (COZAAR) 100 MG tablet TAKE 1 TABLET BY MOUTH ONCE DAILY 90 tablet 3   metoprolol succinate (TOPROL-XL) 50 MG 24 hr tablet TAKE 1 TABLET BY MOUTH ONCE A DAY  IMMEDIATELY FOLLOWING A MEAL 90 tablet 3   Multiple Vitamin (MULTIVITAMIN) tablet Take 1 tablet by mouth daily.     naproxen sodium (ANAPROX) 220 MG tablet Take 220 mg by mouth 2 (two) times daily with a meal.     sertraline (ZOLOFT) 100 MG tablet TAKE 1 TABLET BY MOUTH EVERY NIGHT AT BEDTIME 90 tablet 1   simvastatin (ZOCOR) 20 MG tablet Take 1 tablet (20 mg total) by mouth daily. 90 tablet 3   terbinafine (LAMISIL) 250 MG tablet Take 1 tablet (250 mg total) by mouth daily. 90 tablet 0   No current facility-administered medications on file prior to visit.    Social History   Tobacco Use   Smoking status: Former    Types: Cigarettes    Quit date: 04/10/1997    Years since quitting: 24.5   Smokeless tobacco: Never  Vaping Use   Vaping Use: Never used  Substance Use Topics   Alcohol use: Not  Currently    Alcohol/week: 1.0 standard drink    Types: 1 Glasses of wine per week   Drug use: No    Review of Systems  Constitutional:  Negative for chills and fever.  Respiratory:  Negative for cough.   Cardiovascular:  Negative for chest pain and palpitations.  Gastrointestinal:  Negative for nausea and vomiting.     Objective:    BP 130/78   Pulse 74   Temp (!) 96 F (35.6 C) (Temporal)   Ht 5\' 6"  (1.676 m)   Wt 177 lb 8 oz (80.5 kg)   SpO2 100%   BMI 28.65 kg/m  BP Readings from Last 3 Encounters:  10/26/21 130/78  10/19/21 124/60  10/05/21 138/80   Wt Readings from Last 3 Encounters:  10/26/21 177 lb 8 oz (80.5 kg)  10/05/21 179 lb (81.2 kg)  09/30/21 189 lb (85.7 kg)    Physical Exam Vitals reviewed.  Constitutional:      Appearance: She is well-developed.  HENT:     Right Ear: Hearing, ear canal and external ear normal. No decreased hearing noted. No swelling or tenderness. Tympanic membrane is not injected.     Left Ear: Hearing, ear canal and external ear normal. No decreased hearing noted. No swelling or tenderness. Tympanic membrane is not injected.  Eyes:     Conjunctiva/sclera: Conjunctivae normal.  Cardiovascular:     Rate and Rhythm: Normal rate and regular rhythm.     Pulses: Normal pulses.     Heart sounds: Normal heart sounds.  Pulmonary:     Effort: Pulmonary effort is normal.     Breath sounds: Normal breath sounds. No wheezing, rhonchi or rales.  Skin:    General: Skin is warm and dry.  Neurological:     Mental Status: She is alert.  Psychiatric:        Speech: Speech normal.        Behavior: Behavior normal.        Thought Content: Thought content normal.       Assessment & Plan:   Problem List Items Addressed This Visit       Cardiovascular and Mediastinum   HTN (hypertension) - Primary    Chronic, stable. Continue hctz 12.5mg , amlodipine 5mg , losartan 100mg , toprol 50mg       Relevant Orders   Comprehensive metabolic  panel   Hemoglobin A1c   Lipid panel   CBC with Differential/Platelet   VITAMIN D 25 Hydroxy (Vit-D Deficiency, Fractures)   TSH  Other   Ear itching    Resolved with azelastine.  She will continue      Relevant Medications   azelastine (ASTELIN) 0.1 % nasal spray   Other Visit Diagnoses     Need for influenza vaccination       Relevant Orders   Flu Vaccine QUAD High Dose(Fluad) (Completed)        I have discontinued Guiliana B. Heo's aspirin EC. I am also having her start on azelastine. Additionally, I am having her maintain her multivitamin, naproxen sodium, Vitamin D, cyclobenzaprine, losartan, calcium carbonate, hydrochlorothiazide, metoprolol succinate, simvastatin, amLODipine, sertraline, buPROPion, and terbinafine.   Meds ordered this encounter  Medications   azelastine (ASTELIN) 0.1 % nasal spray    Sig: Place 1 spray into both nostrils 2 (two) times daily. Use in each nostril as directed    Dispense:  30 mL    Refill:  4    Order Specific Question:   Supervising Provider    Answer:   Crecencio Mc [[id number]]    Return precautions given.   Risks, benefits, and alternatives of the medications and treatment plan prescribed today were discussed, and patient expressed understanding.   Education regarding symptom management and diagnosis given to patient on AVS.  Continue to follow with Burnard Hawthorne, FNP for routine health maintenance.   Michelle Clark and I agreed with plan.   Mable Paris, FNP

## 2021-10-28 ENCOUNTER — Other Ambulatory Visit: Payer: Self-pay | Admitting: Family

## 2021-10-28 DIAGNOSIS — I10 Essential (primary) hypertension: Secondary | ICD-10-CM

## 2021-10-28 MED ORDER — POTASSIUM CHLORIDE CRYS ER 10 MEQ PO TBCR: 10.0000 meq | EXTENDED_RELEASE_TABLET | Freq: Every day | ORAL | 1 refills | Status: DC

## 2021-10-29 ENCOUNTER — Other Ambulatory Visit: Payer: Self-pay

## 2021-10-29 DIAGNOSIS — E876 Hypokalemia: Secondary | ICD-10-CM

## 2021-11-03 ENCOUNTER — Other Ambulatory Visit: Payer: Self-pay

## 2021-11-03 ENCOUNTER — Other Ambulatory Visit (INDEPENDENT_AMBULATORY_CARE_PROVIDER_SITE_OTHER): Payer: Medicare Other

## 2021-11-03 DIAGNOSIS — E876 Hypokalemia: Secondary | ICD-10-CM

## 2021-11-04 LAB — BASIC METABOLIC PANEL
BUN: 18 mg/dL (ref 6–23)
CO2: 29 mEq/L (ref 19–32)
Calcium: 9.8 mg/dL (ref 8.4–10.5)
Chloride: 101 mEq/L (ref 96–112)
Creatinine, Ser: 1.02 mg/dL (ref 0.40–1.20)
GFR: 53.32 mL/min — ABNORMAL LOW (ref >60.00)
Glucose, Bld: 76 mg/dL (ref 70–99)
Potassium: 4 mEq/L (ref 3.5–5.1)
Sodium: 137 mEq/L (ref 135–145)

## 2021-12-18 ENCOUNTER — Ambulatory Visit: Payer: Medicare Other | Admitting: Family

## 2022-01-06 ENCOUNTER — Encounter: Payer: Self-pay | Admitting: Family

## 2022-01-06 ENCOUNTER — Ambulatory Visit (INDEPENDENT_AMBULATORY_CARE_PROVIDER_SITE_OTHER): Payer: Medicare Other | Admitting: Family

## 2022-01-06 ENCOUNTER — Other Ambulatory Visit: Payer: Self-pay

## 2022-01-06 VITALS — BP 122/64 | HR 81 | Temp 98.3°F | Ht 66.0 in | Wt 174.8 lb

## 2022-01-06 DIAGNOSIS — I10 Essential (primary) hypertension: Secondary | ICD-10-CM | POA: Diagnosis not present

## 2022-01-06 DIAGNOSIS — F339 Major depressive disorder, recurrent, unspecified: Secondary | ICD-10-CM | POA: Diagnosis not present

## 2022-01-06 DIAGNOSIS — Z87448 Personal history of other diseases of urinary system: Secondary | ICD-10-CM | POA: Diagnosis not present

## 2022-01-06 DIAGNOSIS — E782 Mixed hyperlipidemia: Secondary | ICD-10-CM | POA: Diagnosis not present

## 2022-01-06 DIAGNOSIS — Z1231 Encounter for screening mammogram for malignant neoplasm of breast: Secondary | ICD-10-CM

## 2022-01-06 NOTE — Assessment & Plan Note (Signed)
Chronic, stable. Continue hctz 12.5mg , amlodipine 5mg , losartan 100mg , toprol 50mg 

## 2022-01-06 NOTE — Assessment & Plan Note (Signed)
Chronic, stable.  Continue Zocor.

## 2022-01-06 NOTE — Patient Instructions (Signed)
Referral to urology. Let us know if you dont hear back within a week in regards to an appointment being scheduled.

## 2022-01-06 NOTE — Progress Notes (Signed)
Subjective:    Patient ID: Michelle Clark, female    DOB: October 20, 1946, 76 y.o.   MRN: [id number]  CC: Michelle Clark is a 76 y.o. female who presents today for follow up.   HPI: She has noticed that she 'cannot hold' bladder like she used too.  She noticed a couple months ago.  It is unchanged . she can hold urine for a couple of hours and notes she used to be able to hold for longer. This is episodic. No pelvic pain, abdominal distension, constipation, dysuria, hematuria.  She has never felt a  prolapse from vagina.   Feels that she is emptying bladder.   H/o hysterectomy    Hypertension-compliant with hctz 12.5mg , amlodipine 5mg , losartan 100mg , toprol 50mg  Hyponatremia-compliant with Zocor 20 mg. Anxiety depression-compliant with Wellbutrin 300 mg, zoloft 100mg .  She is pleased with this dose and feels that regimen is working well  She is not taking NSAIDs.  HISTORY:  Past Medical History:  Diagnosis Date   Cancer [facility]) 2001   Right breast, s/p chemotherapy, XRT and mastectomy, Dr. Jeb Levering   Colon polyps    Hyperlipidemia    Hypertension    Personal history of chemotherapy    Personal history of radiation therapy    Pre-diabetes    Past Surgical History:  Procedure Laterality Date   ABDOMINAL HYSTERECTOMY  1998   cervix intact   APPENDECTOMY     BREAST SURGERY  2002   mastectomy   CATARACT EXTRACTION W/PHACO Right 10/09/2019   Procedure: CATARACT EXTRACTION PHACO AND INTRAOCULAR LENS PLACEMENT (IOC) RIGHT DIABETIC 9.43, 00:53;  Surgeon: Birder Robson, MD;  Location: [place];  Service: Ophthalmology;  Laterality: Right;   CHOLECYSTECTOMY     COLONOSCOPY WITH PROPOFOL N/A 01/08/2020   Procedure: COLONOSCOPY WITH PROPOFOL;  Surgeon: Lin Landsman, MD;  Location: [place];  Service: Gastroenterology;  Laterality: N/A;   MASTECTOMY Right 2001   chemo, rad   OOPHORECTOMY     POLYPECTOMY  01/08/2020   Procedure: POLYPECTOMY;  Surgeon:  Lin Landsman, MD;  Location: [place];  Service: Gastroenterology;;   TUBAL LIGATION     Family History  Problem Relation Age of Onset   Arthritis Mother    Stroke Mother 49   Arthritis Sister    Stroke Sister 35       paralyzed left side   Breast cancer Sister 58   Cancer Sister        breast   Heart disease Brother    Asthma Brother    Colon cancer Brother    Lupus Daughter    Lung cancer Son    Breast cancer Cousin        maternal - 30's   Breast cancer Other     Allergies: Fluoxetine Current Outpatient Medications on File Prior to Visit  Medication Sig Dispense Refill   amLODipine (NORVASC) 5 MG tablet TAKE 1 TABLET BY MOUTH ONCE DAILY 90 tablet 3   azelastine (ASTELIN) 0.1 % nasal spray Place 1 spray into both nostrils 2 (two) times daily. Use in each nostril as directed 30 mL 4   buPROPion (WELLBUTRIN XL) 300 MG 24 hr tablet Take 1 tablet (300 mg total) by mouth every morning. 90 tablet 1   calcium carbonate (OSCAL) 1500 (600 Ca) MG TABS tablet Take 600 mg of elemental calcium by mouth daily with breakfast.     Cholecalciferol (VITAMIN D) 2000 units tablet Take 1 tablet by mouth daily.  cyclobenzaprine (FLEXERIL) 5 MG tablet TAKE 1 TABLET BY MOUTH EVERY NIGHT AT BEDTIME AS NEEDED FOR MUSCLE SPASMS 30 tablet 0   hydrochlorothiazide (MICROZIDE) 12.5 MG capsule Take 1 capsule (12.5 mg total) by mouth daily. 90 capsule 1   losartan (COZAAR) 100 MG tablet TAKE 1 TABLET BY MOUTH ONCE DAILY 90 tablet 3   metoprolol succinate (TOPROL-XL) 50 MG 24 hr tablet TAKE 1 TABLET BY MOUTH ONCE A DAY IMMEDIATELY FOLLOWING A MEAL 90 tablet 3   Multiple Vitamin (MULTIVITAMIN) tablet Take 1 tablet by mouth daily.     potassium chloride (KLOR-CON M) 10 MEQ tablet Take 1 tablet (10 mEq total) by mouth daily. 90 tablet 1   sertraline (ZOLOFT) 100 MG tablet TAKE 1 TABLET BY MOUTH EVERY NIGHT AT BEDTIME 90 tablet 1   simvastatin (ZOCOR) 20 MG tablet Take 1 tablet (20 mg total)  by mouth daily. 90 tablet 3   terbinafine (LAMISIL) 250 MG tablet Take 1 tablet (250 mg total) by mouth daily. 90 tablet 0   No current facility-administered medications on file prior to visit.    Social History   Tobacco Use   Smoking status: Former    Types: Cigarettes    Quit date: 04/10/1997    Years since quitting: 24.7   Smokeless tobacco: Never  Vaping Use   Vaping Use: Never used  Substance Use Topics   Alcohol use: Not Currently    Alcohol/week: 1.0 standard drink    Types: 1 Glasses of wine per week   Drug use: No    Review of Systems  Constitutional:  Negative for chills and fever.  Respiratory:  Negative for cough.   Cardiovascular:  Negative for chest pain and palpitations.  Gastrointestinal:  Negative for abdominal distention, nausea and vomiting.  Genitourinary:  Negative for difficulty urinating, dysuria and hematuria.     Objective:    BP 132/68 (BP Location: Left Arm, Patient Position: Sitting, Cuff Size: Large)    Pulse 81    Temp 98.3 F (36.8 C) (Oral)    Ht 5\' 6"  (1.676 m)    Wt 174 lb 12.8 oz (79.3 kg)    SpO2 98%    BMI 28.21 kg/m  BP Readings from Last 3 Encounters:  01/06/22 132/68  10/26/21 130/78  10/19/21 124/60   Wt Readings from Last 3 Encounters:  01/06/22 174 lb 12.8 oz (79.3 kg)  10/26/21 177 lb 8 oz (80.5 kg)  10/05/21 179 lb (81.2 kg)    Physical Exam Vitals reviewed.  Constitutional:      Appearance: Normal appearance. She is well-developed.  Eyes:     Conjunctiva/sclera: Conjunctivae normal.  Cardiovascular:     Rate and Rhythm: Normal rate and regular rhythm.     Pulses: Normal pulses.     Heart sounds: Normal heart sounds.  Pulmonary:     Effort: Pulmonary effort is normal.     Breath sounds: Normal breath sounds. No wheezing, rhonchi or rales.  Abdominal:     General: Bowel sounds are normal. There is no distension.     Palpations: Abdomen is soft. Abdomen is not rigid. There is no fluid wave or mass.      Tenderness: There is no abdominal tenderness. There is no guarding or rebound.  Genitourinary:    Comments: No bladder prolapse Skin:    General: Skin is warm and dry.  Neurological:     Mental Status: She is alert.  Psychiatric:        Speech: Speech  normal.        Behavior: Behavior normal.        Thought Content: Thought content normal.       Assessment & Plan:   Problem List Items Addressed This Visit       Cardiovascular and Mediastinum   HTN (hypertension)    Chronic, stable. Continue hctz 12.5mg , amlodipine 5mg , losartan 100mg , toprol 50mg       Relevant Orders   Basic metabolic panel     Other   Depression, recurrent (HCC)    Chronic, stable.  Continue Wellbutrin 300 mg, zoloft 100mg       History of changes to urinary frequency - Primary    Etiology of symptoms unclear at this time.  Benign abdominal exam.  Benign pelvic exam without obvious bladder prolapse.  Pending urinalysis today.  Discussed with patient concern perhaps symptoms could be related to urinary obstruction if she is not emptying bladder therefore causing her to urinate more frequently.  Although this could be physiologic as well.  We jointly agreed consult urology for further evaluation and reassurance.      Relevant Orders   Ambulatory referral to Urology   Urinalysis, Routine w reflex microscopic   Urine Culture   Hyperlipidemia    Chronic, stable.  Continue Zocor.      Other Visit Diagnoses     Encounter for screening mammogram for malignant neoplasm of breast       Relevant Orders   MM 3D SCREEN BREAST UNI LEFT        I have discontinued Deneise Lever B. Dueitt's naproxen sodium. I am also having her maintain her multivitamin, Vitamin D, cyclobenzaprine, losartan, calcium carbonate, hydrochlorothiazide, metoprolol succinate, simvastatin, amLODipine, sertraline, buPROPion, terbinafine, azelastine, and potassium chloride.   No orders of the defined types were placed in this  encounter.   Return precautions given.   Risks, benefits, and alternatives of the medications and treatment plan prescribed today were discussed, and patient expressed understanding.   Education regarding symptom management and diagnosis given to patient on AVS.  Continue to follow with Burnard Hawthorne, FNP for routine health maintenance.   Michelle Clark and I agreed with plan.   Mable Paris, FNP

## 2022-01-06 NOTE — Assessment & Plan Note (Signed)
Chronic, stable.  Continue Wellbutrin 300 mg, zoloft 100mg 

## 2022-01-06 NOTE — Assessment & Plan Note (Signed)
Etiology of symptoms unclear at this time.  Benign abdominal exam.  Benign pelvic exam without obvious bladder prolapse.  Pending urinalysis today.  Discussed with patient concern perhaps symptoms could be related to urinary obstruction if she is not emptying bladder therefore causing her to urinate more frequently.  Although this could be physiologic as well.  We jointly agreed consult urology for further evaluation and reassurance.

## 2022-01-07 LAB — BASIC METABOLIC PANEL
BUN: 17 mg/dL (ref 6–23)
CO2: 31 mEq/L (ref 19–32)
Calcium: 10.3 mg/dL (ref 8.4–10.5)
Chloride: 104 mEq/L (ref 96–112)
Creatinine, Ser: 0.96 mg/dL (ref 0.40–1.20)
GFR: 57.27 mL/min — ABNORMAL LOW (ref >60.00)
Glucose, Bld: 73 mg/dL (ref 70–99)
Potassium: 3.9 mEq/L (ref 3.5–5.1)
Sodium: 140 mEq/L (ref 135–145)

## 2022-01-07 LAB — URINE CULTURE
MICRO NUMBER:: 13012676
SPECIMEN QUALITY:: ADEQUATE

## 2022-01-07 LAB — URINALYSIS, ROUTINE W REFLEX MICROSCOPIC
Bilirubin Urine: NEGATIVE
Hgb urine dipstick: NEGATIVE
Ketones, ur: NEGATIVE
Leukocytes,Ua: NEGATIVE
Nitrite: NEGATIVE
RBC / HPF: NONE SEEN (ref 0–?)
Specific Gravity, Urine: 1.02 (ref 1.000–1.030)
Total Protein, Urine: NEGATIVE
Urine Glucose: NEGATIVE
Urobilinogen, UA: 0.2 (ref 0.0–1.0)
pH: 6 (ref 5.0–8.0)

## 2022-01-11 ENCOUNTER — Other Ambulatory Visit: Payer: Self-pay | Admitting: Podiatry

## 2022-01-13 ENCOUNTER — Other Ambulatory Visit: Payer: Self-pay | Admitting: Podiatry

## 2022-01-13 NOTE — Telephone Encounter (Signed)
"  I need a prescription refill.  My pharmacy said they had sent a prescription refill request to Dr. Sherryle Lis and I tried to call too but I haven't heard anything yet.  Can you tell me when he's going to refill that?"  I will send Dr. Sherryle Lis a message but I don't know when and if he's going to refill it?  He's seeing patients right now.  "Will you call and let me know?"  Yes, we'll let you know.

## 2022-01-18 ENCOUNTER — Telehealth: Payer: Self-pay | Admitting: Podiatry

## 2022-01-18 NOTE — Telephone Encounter (Signed)
Called Michelle Clark on VM for pt to call the office so I can relay message to her.

## 2022-01-18 NOTE — Telephone Encounter (Signed)
Patient states she has a RX for fungus, pt states she has no refills on her RX. Patient states she does not have an appt with Dr Sherryle Lis until 3.29.2023. Patient states the pharmacy wont fill it.

## 2022-01-18 NOTE — Telephone Encounter (Signed)
Pt confirmed understanding

## 2022-01-21 ENCOUNTER — Ambulatory Visit (INDEPENDENT_AMBULATORY_CARE_PROVIDER_SITE_OTHER): Payer: Medicare Other | Admitting: Urology

## 2022-01-21 ENCOUNTER — Encounter: Payer: Self-pay | Admitting: Urology

## 2022-01-21 ENCOUNTER — Other Ambulatory Visit: Payer: Self-pay

## 2022-01-21 VITALS — BP 126/78 | HR 89 | Ht 66.0 in | Wt 174.0 lb

## 2022-01-21 DIAGNOSIS — N3281 Overactive bladder: Secondary | ICD-10-CM

## 2022-01-21 DIAGNOSIS — R350 Frequency of micturition: Secondary | ICD-10-CM | POA: Diagnosis not present

## 2022-01-21 LAB — URINALYSIS, COMPLETE
Bilirubin, UA: NEGATIVE
Glucose, UA: NEGATIVE
Ketones, UA: NEGATIVE
Leukocytes,UA: NEGATIVE
Nitrite, UA: NEGATIVE
Protein,UA: NEGATIVE
RBC, UA: NEGATIVE
Specific Gravity, UA: 1.02 (ref 1.005–1.030)
Urobilinogen, Ur: 0.2 mg/dL (ref 0.2–1.0)
pH, UA: 5.5 (ref 5.0–7.5)

## 2022-01-21 LAB — MICROSCOPIC EXAMINATION
Bacteria, UA: NONE SEEN
Epithelial Cells (non renal): NONE SEEN /hpf (ref 0–10)
RBC, Urine: NONE SEEN /hpf (ref 0–2)
WBC, UA: NONE SEEN /hpf (ref 0–5)

## 2022-01-21 LAB — BLADDER SCAN AMB NON-IMAGING: Scan Result: 0

## 2022-01-21 NOTE — Progress Notes (Signed)
? ?01/21/2022 ?11:54 AM  ? ?Michelle Clark ?July 13, 1946 ?[id number] ? ?Referring provider: Burnard Hawthorne, FNP ?[phone] ?[address] ?[place],  [place] [address] ? ?Chief Complaint  ?Patient presents with  ? Urinary Frequency  ? ? ?HPI: ?Michelle Clark is a 76 y.o. female referred for evaluation of urinary frequency. ? ?2-3 month history of increased urinary frequency and urgency ?Rare episodes urge incontinence ?No recurrent UTI, gross hematuria ?Denies flank, abdominal or pelvic pain ?Prior hysterectomy and 3 previous vaginal deliveries ?No neurologic history ? ? ?PMH: ?Past Medical History:  ?Diagnosis Date  ? Cancer [facility]) 2001  ? Right breast, s/p chemotherapy, XRT and mastectomy, Dr. Jeb Levering  ? Colon polyps   ? Hyperlipidemia   ? Hypertension   ? Personal history of chemotherapy   ? Personal history of radiation therapy   ? Pre-diabetes   ? ? ?Surgical History: ?Past Surgical History:  ?Procedure Laterality Date  ? ABDOMINAL HYSTERECTOMY  1998  ? cervix intact  ? APPENDECTOMY    ? BREAST SURGERY  2002  ? mastectomy  ? CATARACT EXTRACTION W/PHACO Right 10/09/2019  ? Procedure: CATARACT EXTRACTION PHACO AND INTRAOCULAR LENS PLACEMENT (IOC) RIGHT DIABETIC 9.43, 00:53;  Surgeon: Birder Robson, MD;  Location: [place];  Service: Ophthalmology;  Laterality: Right;  ? CHOLECYSTECTOMY    ? COLONOSCOPY WITH PROPOFOL N/A 01/08/2020  ? Procedure: COLONOSCOPY WITH PROPOFOL;  Surgeon: Lin Landsman, MD;  Location: [place];  Service: Gastroenterology;  Laterality: N/A;  ? MASTECTOMY Right 2001  ? chemo, rad  ? OOPHORECTOMY    ? POLYPECTOMY  01/08/2020  ? Procedure: POLYPECTOMY;  Surgeon: Lin Landsman, MD;  Location: [place];  Service: Gastroenterology;;  ? TUBAL LIGATION    ? ? ?Home Medications:  ?Allergies as of 01/21/2022   ? ?   Reactions  ? Fluoxetine   ? drowsiness  ? ?  ? ?  ?Medication List  ?  ? ?  ? Accurate as of January 21, 2022 11:54 AM. If you have any  questions, ask your nurse or doctor.  ?  ?  ? ?  ? ?STOP taking these medications   ? ?potassium chloride 10 MEQ tablet ?Commonly known as: KLOR-CON M ?Stopped by: Abbie Sons, MD ?  ? ?  ? ?TAKE these medications   ? ?amLODipine 5 MG tablet ?Commonly known as: NORVASC ?TAKE 1 TABLET BY MOUTH ONCE DAILY ?  ?azelastine 0.1 % nasal spray ?Commonly known as: ASTELIN ?Place 1 spray into both nostrils 2 (two) times daily. Use in each nostril as directed ?  ?buPROPion 300 MG 24 hr tablet ?Commonly known as: WELLBUTRIN XL ?Take 1 tablet (300 mg total) by mouth every morning. ?  ?calcium carbonate 1500 (600 Ca) MG Tabs tablet ?Commonly known as: OSCAL ?Take 600 mg of elemental calcium by mouth daily with breakfast. ?  ?cyclobenzaprine 5 MG tablet ?Commonly known as: FLEXERIL ?TAKE 1 TABLET BY MOUTH EVERY NIGHT AT BEDTIME AS NEEDED FOR MUSCLE SPASMS ?  ?hydrochlorothiazide 12.5 MG capsule ?Commonly known as: MICROZIDE ?Take 1 capsule (12.5 mg total) by mouth daily. ?  ?losartan 100 MG tablet ?Commonly known as: COZAAR ?TAKE 1 TABLET BY MOUTH ONCE DAILY ?  ?metoprolol succinate 50 MG 24 hr tablet ?Commonly known as: TOPROL-XL ?TAKE 1 TABLET BY MOUTH ONCE A DAY IMMEDIATELY FOLLOWING A MEAL ?  ?multivitamin tablet ?Take 1 tablet by mouth daily. ?  ?potassium chloride 10 MEQ tablet ?Commonly known as: KLOR-CON ?Take  10 mEq by mouth daily. ?  ?sertraline 100 MG tablet ?Commonly known as: ZOLOFT ?TAKE 1 TABLET BY MOUTH EVERY NIGHT AT BEDTIME ?  ?simvastatin 20 MG tablet ?Commonly known as: ZOCOR ?Take 1 tablet (20 mg total) by mouth daily. ?  ?terbinafine 250 MG tablet ?Commonly known as: LAMISIL ?TAKE 1 TABLET BY MOUTH ONCE A DAY ?  ?Vitamin D 50 MCG (2000 UT) tablet ?Take 1 tablet by mouth daily. ?  ? ?  ? ? ?Allergies:  ?Allergies  ?Allergen Reactions  ? Fluoxetine   ?  drowsiness  ? ? ?Family History: ?Family History  ?Problem Relation Age of Onset  ? Arthritis Mother   ? Stroke Mother 54  ? Arthritis Sister   ? Stroke  Sister 78  ?     paralyzed left side  ? Breast cancer Sister 62  ? Cancer Sister   ?     breast  ? Heart disease Brother   ? Asthma Brother   ? Colon cancer Brother   ? Lupus Daughter   ? Lung cancer Son   ? Breast cancer Cousin   ?     maternal - 46's  ? Breast cancer Other   ? ? ?Social History:  reports that she quit smoking about 24 years ago. Her smoking use included cigarettes. She has never used smokeless tobacco. She reports that she does not currently use alcohol after a past usage of about 1.0 standard drink per week. She reports that she does not use drugs. ? ? ?Physical Exam: ?BP 126/78   Pulse 89   Ht 5\' 6"  (1.676 m)   Wt 174 lb (78.9 kg)   BMI 28.08 kg/m?   ?Constitutional:  Alert and oriented, No acute distress. ?HEENT: [place] AT, moist mucus membranes.  Trachea midline, no masses. ?Respiratory: Normal respiratory effort, no increased work of breathing. ?Psychiatric: Normal mood and affect. ? ?Laboratory Data: ? ?Urinalysis ?Dipstick/microscopy negative ? ? ?Assessment & Plan:   ? ?1.  Overactive bladder ?We discussed the exact etiology of idiopathic overactive bladder is unknown and most likely multifactorial including aging, previous childbirth and prior pelvic surgery ?Bladder scan PVR today 0 mL ?Management options were discussed including behavioral therapy, pelvic floor physical therapy and medical management ?She is not interested in management at this time and indicated she would call back if her symptoms worsen ? ? ?Abbie Sons, MD ? ?[facility] ?[address], [address] ?[place], [place] [address] ?[phone] ? ?

## 2022-01-25 ENCOUNTER — Other Ambulatory Visit: Payer: Self-pay

## 2022-01-25 ENCOUNTER — Ambulatory Visit (INDEPENDENT_AMBULATORY_CARE_PROVIDER_SITE_OTHER): Payer: Medicare Other | Admitting: Family

## 2022-01-25 ENCOUNTER — Telehealth: Payer: Self-pay

## 2022-01-25 ENCOUNTER — Encounter: Payer: Self-pay | Admitting: Family

## 2022-01-25 DIAGNOSIS — I10 Essential (primary) hypertension: Secondary | ICD-10-CM

## 2022-01-25 DIAGNOSIS — R110 Nausea: Secondary | ICD-10-CM | POA: Diagnosis not present

## 2022-01-25 DIAGNOSIS — N3281 Overactive bladder: Secondary | ICD-10-CM | POA: Diagnosis not present

## 2022-01-25 NOTE — Telephone Encounter (Signed)
LMTCB for results. 

## 2022-01-25 NOTE — Progress Notes (Signed)
? ?Subjective:  ? ? Patient ID: Michelle Clark, female    DOB: Oct 18, 1946, 76 y.o.   MRN: [id number] ? ?CC: Michelle Clark is a 76 y.o. female who presents today for follow up.  ? ?HPI: Feels well ?She reports that earlier this morning she felt nausea, since resolved. She had fasted for this appointment as wasn't sure if needed labs ?Denies fever, chills, abdominal pain, dysuria, cough, chest pain, epigastric burning, diaphoresis.  ? ?HTN-compliant with hctz 12.'5mg'$ , amlodipine '5mg'$ , losartan '100mg'$ , toprol '50mg'$  ?Consult with urology, Dr. Bernardo Heater 01/21/2022 for overactive bladder.  She was not interested in management at this time. ?Mammogram is scheduled ?HISTORY:  ?Past Medical History:  ?Diagnosis Date  ? Cancer [facility]) 2001  ? Right breast, s/p chemotherapy, XRT and mastectomy, Dr. Jeb Levering  ? Colon polyps   ? Hyperlipidemia   ? Hypertension   ? Personal history of chemotherapy   ? Personal history of radiation therapy   ? Pre-diabetes   ? ?Past Surgical History:  ?Procedure Laterality Date  ? ABDOMINAL HYSTERECTOMY  1998  ? cervix intact  ? APPENDECTOMY    ? BREAST SURGERY  2002  ? mastectomy  ? CATARACT EXTRACTION W/PHACO Right 10/09/2019  ? Procedure: CATARACT EXTRACTION PHACO AND INTRAOCULAR LENS PLACEMENT (IOC) RIGHT DIABETIC 9.43, 00:53;  Surgeon: Birder Robson, MD;  Location: [place];  Service: Ophthalmology;  Laterality: Right;  ? CHOLECYSTECTOMY    ? COLONOSCOPY WITH PROPOFOL N/A 01/08/2020  ? Procedure: COLONOSCOPY WITH PROPOFOL;  Surgeon: Lin Landsman, MD;  Location: [place];  Service: Gastroenterology;  Laterality: N/A;  ? MASTECTOMY Right 2001  ? chemo, rad  ? OOPHORECTOMY    ? POLYPECTOMY  01/08/2020  ? Procedure: POLYPECTOMY;  Surgeon: Lin Landsman, MD;  Location: [place];  Service: Gastroenterology;;  ? TUBAL LIGATION    ? ?Family History  ?Problem Relation Age of Onset  ? Arthritis Mother   ? Stroke Mother 36  ? Arthritis Sister   ? Stroke Sister 4   ?     paralyzed left side  ? Breast cancer Sister 68  ? Cancer Sister   ?     breast  ? Heart disease Brother   ? Asthma Brother   ? Colon cancer Brother   ? Lupus Daughter   ? Lung cancer Son   ? Breast cancer Cousin   ?     maternal - 74's  ? Breast cancer Other   ? ? ?Allergies: Fluoxetine ?Current Outpatient Medications on File Prior to Visit  ?Medication Sig Dispense Refill  ? amLODipine (NORVASC) 5 MG tablet TAKE 1 TABLET BY MOUTH ONCE DAILY 90 tablet 3  ? azelastine (ASTELIN) 0.1 % nasal spray Place 1 spray into both nostrils 2 (two) times daily. Use in each nostril as directed 30 mL 4  ? buPROPion (WELLBUTRIN XL) 300 MG 24 hr tablet Take 1 tablet (300 mg total) by mouth every morning. 90 tablet 1  ? calcium carbonate (OSCAL) 1500 (600 Ca) MG TABS tablet Take 600 mg of elemental calcium by mouth daily with breakfast.    ? Cholecalciferol (VITAMIN D) 2000 units tablet Take 1 tablet by mouth daily.    ? cyclobenzaprine (FLEXERIL) 5 MG tablet TAKE 1 TABLET BY MOUTH EVERY NIGHT AT BEDTIME AS NEEDED FOR MUSCLE SPASMS 30 tablet 0  ? hydrochlorothiazide (MICROZIDE) 12.5 MG capsule Take 1 capsule (12.5 mg total) by mouth daily. 90 capsule 1  ? losartan (COZAAR) 100 MG tablet TAKE  1 TABLET BY MOUTH ONCE DAILY 90 tablet 3  ? metoprolol succinate (TOPROL-XL) 50 MG 24 hr tablet TAKE 1 TABLET BY MOUTH ONCE A DAY IMMEDIATELY FOLLOWING A MEAL 90 tablet 3  ? Multiple Vitamin (MULTIVITAMIN) tablet Take 1 tablet by mouth daily.    ? potassium chloride (KLOR-CON) 10 MEQ tablet Take 10 mEq by mouth daily.    ? sertraline (ZOLOFT) 100 MG tablet TAKE 1 TABLET BY MOUTH EVERY NIGHT AT BEDTIME 90 tablet 1  ? simvastatin (ZOCOR) 20 MG tablet Take 1 tablet (20 mg total) by mouth daily. 90 tablet 3  ? terbinafine (LAMISIL) 250 MG tablet TAKE 1 TABLET BY MOUTH ONCE A DAY 90 tablet 0  ? ?No current facility-administered medications on file prior to visit.  ? ? ?Social History  ? ?Tobacco Use  ? Smoking status: Former  ?  Types:  Cigarettes  ?  Quit date: 04/10/1997  ?  Years since quitting: 24.8  ? Smokeless tobacco: Never  ?Vaping Use  ? Vaping Use: Never used  ?Substance Use Topics  ? Alcohol use: Not Currently  ?  Alcohol/week: 1.0 standard drink  ?  Types: 1 Glasses of wine per week  ? Drug use: No  ? ? ?Review of Systems  ?Constitutional:  Negative for chills and fever.  ?Respiratory:  Negative for cough.   ?Cardiovascular:  Negative for chest pain and palpitations.  ?Gastrointestinal:  Negative for abdominal pain, nausea (resolved) and vomiting.  ?Genitourinary:  Negative for dysuria.  ?   ?Objective:  ?  ?BP 138/78 (BP Location: Left Arm, Patient Position: Sitting, Cuff Size: Normal)   Pulse 71   Temp (!) 97.1 ?F (36.2 ?C) (Temporal)   Ht '5\' 8"'$  (1.727 m)   Wt 177 lb 3.2 oz (80.4 kg)   SpO2 98%   BMI 26.94 kg/m?  ?BP Readings from Last 3 Encounters:  ?01/25/22 138/78  ?01/21/22 126/78  ?01/06/22 122/64  ? ?Wt Readings from Last 3 Encounters:  ?01/25/22 177 lb 3.2 oz (80.4 kg)  ?01/21/22 174 lb (78.9 kg)  ?01/06/22 174 lb 12.8 oz (79.3 kg)  ? ? ?Physical Exam ?Vitals reviewed.  ?Constitutional:   ?   Appearance: Normal appearance. She is well-developed.  ?Eyes:  ?   Conjunctiva/sclera: Conjunctivae normal.  ?Cardiovascular:  ?   Rate and Rhythm: Normal rate and regular rhythm.  ?   Pulses: Normal pulses.  ?   Heart sounds: Normal heart sounds.  ?Pulmonary:  ?   Effort: Pulmonary effort is normal.  ?   Breath sounds: Normal breath sounds. No wheezing, rhonchi or rales.  ?Abdominal:  ?   General: Bowel sounds are normal. There is no distension.  ?   Palpations: Abdomen is soft. Abdomen is not rigid. There is no fluid wave or mass.  ?   Tenderness: There is no abdominal tenderness. There is no guarding or rebound.  ?Skin: ?   General: Skin is warm and dry.  ?Neurological:  ?   Mental Status: She is alert.  ?Psychiatric:     ?   Speech: Speech normal.     ?   Behavior: Behavior normal.     ?   Thought Content: Thought content normal.   ? ? ?   ?Assessment & Plan:  ? ?Problem List Items Addressed This Visit   ? ?  ? Cardiovascular and Mediastinum  ? HTN (hypertension)  ?  Chronic, stable. Continue hctz 12.'5mg'$ , amlodipine '5mg'$ , losartan '100mg'$ , toprol '50mg'$  ?  ?  ?  ?  Genitourinary  ? OAB (overactive bladder)  ?  ? Other  ? Nausea  ?  Resolved. Benign abdominal exam at this time. Reiterated the importance to patient of letting me know if nausea where to recur or other symptoms were to develop. She will let me know of any concerns.  ?  ?  ? ? ? ?I am having Tamiyah B. Korpi maintain her multivitamin, Vitamin D, cyclobenzaprine, losartan, calcium carbonate, hydrochlorothiazide, metoprolol succinate, simvastatin, amLODipine, sertraline, buPROPion, azelastine, terbinafine, and potassium chloride. ? ? ?No orders of the defined types were placed in this encounter. ? ? ?Return precautions given.  ? ?Risks, benefits, and alternatives of the medications and treatment plan prescribed today were discussed, and patient expressed understanding.  ? ?Education regarding symptom management and diagnosis given to patient on AVS. ? ?Continue to follow with Burnard Hawthorne, FNP for routine health maintenance.  ? ?Allena Napoleon and I agreed with plan.  ? ?Mable Paris, FNP ? ? ? ?

## 2022-01-25 NOTE — Patient Instructions (Signed)
We recommend you get the updated bivalent COVID-19 booster, at least 2 months after any prior doses. You may consider delaying a booster dose by 3 months from a prior episode of COVID-19 per the CDC.  ? ?You can find pharmacies that have this formulation in stock at [email or web address]  ? ? ?Please let me know how you are feeling and certainly if nausea returns.  ? ? ?

## 2022-01-25 NOTE — Assessment & Plan Note (Signed)
Chronic, stable. Continue hctz 12.'5mg'$ , amlodipine '5mg'$ , losartan '100mg'$ , toprol '50mg'$  ?

## 2022-01-25 NOTE — Progress Notes (Signed)
Patient stated she was nauseated this morning, she did not take anything tho. ?

## 2022-01-25 NOTE — Assessment & Plan Note (Signed)
Resolved. Benign abdominal exam at this time. Reiterated the importance to patient of letting me know if nausea where to recur or other symptoms were to develop. She will let me know of any concerns.  ?

## 2022-02-15 ENCOUNTER — Other Ambulatory Visit: Payer: Self-pay | Admitting: Family

## 2022-02-15 DIAGNOSIS — I10 Essential (primary) hypertension: Secondary | ICD-10-CM

## 2022-02-17 ENCOUNTER — Encounter: Payer: Self-pay | Admitting: Podiatry

## 2022-02-17 ENCOUNTER — Ambulatory Visit (INDEPENDENT_AMBULATORY_CARE_PROVIDER_SITE_OTHER): Payer: Medicare Other | Admitting: Podiatry

## 2022-02-17 DIAGNOSIS — B351 Tinea unguium: Secondary | ICD-10-CM | POA: Diagnosis not present

## 2022-02-19 NOTE — Progress Notes (Signed)
?  Subjective:  ?Patient ID: Michelle Clark, female    DOB: Feb 11, 1946,  MRN: [id number] ? ?Chief Complaint  ?Patient presents with  ? Nail Problem  ?  "I can see some improvement"  ? ? ?76 y.o. female returns for follow-up with the above complaint. History confirmed with patient.  She notes quite a bit of improvement, no side effects from the Lamisil, she already picked up the refill but has not started taking it yet ? ?Objective:  ?Physical Exam: ?warm, good capillary refill, no trophic changes or ulcerative lesions, normal DP and PT pulses, and normal sensory exam. ?Left Foot: Mycotic hallux nail with yellow discoloration, 50% clearance approximately ? ? ? ? ? ?Assessment:  ? ?1. Onychomycosis   ? ? ? ?Plan:  ?Patient was evaluated and treated and all questions answered. ? ?Onychomycosis ?She is doing very well.  She has not had any ill effects from the Lamisil treatment.  About 50% clearance now.  She will take the next 3 months of Lamisil and I will see her back in 4 months for reevaluation. ? ?Return in about 4 months (around 06/19/2022) for follow up after nail fungus treatment.  ? ? ?
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
## 2022-03-18 ENCOUNTER — Other Ambulatory Visit: Payer: Self-pay | Admitting: Family Medicine

## 2022-03-18 ENCOUNTER — Other Ambulatory Visit: Payer: Self-pay | Admitting: Family

## 2022-03-18 DIAGNOSIS — F339 Major depressive disorder, recurrent, unspecified: Secondary | ICD-10-CM

## 2022-03-23 ENCOUNTER — Ambulatory Visit
Admission: RE | Admit: 2022-03-23 | Discharge: 2022-03-23 | Disposition: A | Discharge status: Home / Self Care | Payer: Medicare Other | Source: Ambulatory Visit | Attending: Family | Admitting: Family

## 2022-03-23 DIAGNOSIS — Z1231 Encounter for screening mammogram for malignant neoplasm of breast: Secondary | ICD-10-CM | POA: Diagnosis not present

## 2022-04-06 ENCOUNTER — Ambulatory Visit: Payer: Medicare Other | Admitting: Family

## 2022-04-09 ENCOUNTER — Ambulatory Visit: Payer: Medicare Other | Admitting: Family

## 2022-04-27 ENCOUNTER — Other Ambulatory Visit: Payer: Self-pay | Admitting: Family

## 2022-06-02 ENCOUNTER — Ambulatory Visit (INDEPENDENT_AMBULATORY_CARE_PROVIDER_SITE_OTHER): Payer: Medicare Other | Admitting: Family

## 2022-06-02 ENCOUNTER — Encounter: Payer: Self-pay | Admitting: Family

## 2022-06-02 VITALS — BP 128/70 | HR 81 | Temp 97.4°F | Ht 66.0 in | Wt 182.6 lb

## 2022-06-02 DIAGNOSIS — R21 Rash and other nonspecific skin eruption: Secondary | ICD-10-CM | POA: Diagnosis not present

## 2022-06-02 DIAGNOSIS — F339 Major depressive disorder, recurrent, unspecified: Secondary | ICD-10-CM

## 2022-06-02 DIAGNOSIS — I700 Atherosclerosis of aorta: Secondary | ICD-10-CM | POA: Diagnosis not present

## 2022-06-02 DIAGNOSIS — I10 Essential (primary) hypertension: Secondary | ICD-10-CM | POA: Diagnosis not present

## 2022-06-02 MED ORDER — MUPIROCIN 2 % EX OINT: 1.0000 | TOPICAL_OINTMENT | Freq: Two times a day (BID) | CUTANEOUS | 2 refills | Status: DC

## 2022-06-02 NOTE — Patient Instructions (Signed)
Start bactroban on your upper back which is antibiotic.   Let me know if doesn't resolve

## 2022-06-02 NOTE — Progress Notes (Signed)
Athersclerosis of Aorta

## 2022-06-02 NOTE — Assessment & Plan Note (Signed)
Chronic, stable.  Continue Zoloft 100 mg, Wellbutrin 300 mg

## 2022-06-02 NOTE — Progress Notes (Addendum)
Subjective:    Patient ID: Michelle Clark, female    DOB: 1946/07/13, 76 y.o.   MRN: [id number]  CC: Michelle Clark is a 76 y.o. female who presents today for follow up.   HPI: Complains of left upper back rash x couple of months, unchanged.  Itchy.  No drainage, fever, painful, numbness  She had questioned bug bites   HTN- compliant with hctz 12.'5mg'$ , amlodipine '5mg'$ , losartan '100mg'$ , toprol '50mg'$ , potassium chloride 10 mEq. No cp  HLD- compliant with zocor '20mg'$   Depression-compliant with Zoloft 100 mg, Wellbutrin 300 mg .  She feels regimen is working well for her.   HISTORY:  Past Medical History:  Diagnosis Date   Cancer [facility]) 2001   Right breast, s/p chemotherapy, XRT and mastectomy, Dr. Jeb Clark   Colon polyps    Hyperlipidemia    Hypertension    Personal history of chemotherapy    Personal history of radiation therapy    Pre-diabetes    Past Surgical History:  Procedure Laterality Date   ABDOMINAL HYSTERECTOMY  1998   cervix intact   APPENDECTOMY     BREAST SURGERY  2002   mastectomy   CATARACT EXTRACTION W/PHACO Right 10/09/2019   Procedure: CATARACT EXTRACTION PHACO AND INTRAOCULAR LENS PLACEMENT (IOC) RIGHT DIABETIC 9.43, 00:53;  Surgeon: Michelle Robson, MD;  Location: [place];  Service: Ophthalmology;  Laterality: Right;   CHOLECYSTECTOMY     COLONOSCOPY WITH PROPOFOL N/A 01/08/2020   Procedure: COLONOSCOPY WITH PROPOFOL;  Surgeon: Michelle Landsman, MD;  Location: [place];  Service: Gastroenterology;  Laterality: N/A;   MASTECTOMY Right 2001   chemo, rad   OOPHORECTOMY     POLYPECTOMY  01/08/2020   Procedure: POLYPECTOMY;  Surgeon: Michelle Landsman, MD;  Location: [place];  Service: Gastroenterology;;   TUBAL LIGATION     Family History  Problem Relation Age of Onset   Arthritis Mother    Stroke Mother 86   Arthritis Sister    Stroke Sister 82       paralyzed left side   Breast cancer Sister 83   Cancer  Sister        breast   Heart disease Brother    Asthma Brother    Colon cancer Brother    Lupus Daughter    Lung cancer Son    Breast cancer Cousin        maternal - 30's   Breast cancer Other     Allergies: Fluoxetine Current Outpatient Medications on File Prior to Visit  Medication Sig Dispense Refill   amLODipine (NORVASC) 5 MG tablet TAKE 1 TABLET BY MOUTH ONCE DAILY 90 tablet 3   azelastine (ASTELIN) 0.1 % nasal spray Place 1 spray into both nostrils 2 (two) times daily. Use in each nostril as directed 30 mL 4   buPROPion (WELLBUTRIN XL) 300 MG 24 hr tablet TAKE 1 TABLET BY MOUTH EVERY MORNING 90 tablet 1   calcium carbonate (OSCAL) 1500 (600 Ca) MG TABS tablet Take 600 mg of elemental calcium by mouth daily with breakfast.     Cholecalciferol (VITAMIN D) 2000 units tablet Take 1 tablet by mouth daily.     cyclobenzaprine (FLEXERIL) 5 MG tablet TAKE 1 TABLET BY MOUTH EVERY NIGHT AT BEDTIME AS NEEDED FOR MUSCLE SPASMS 30 tablet 0   hydrochlorothiazide (MICROZIDE) 12.5 MG capsule TAKE 1 CAPSULE BY MOUTH ONCE DAILY 90 capsule 1   losartan (COZAAR) 100 MG tablet TAKE 1 TABLET BY MOUTH ONCE  DAILY 90 tablet 3   metoprolol succinate (TOPROL-XL) 50 MG 24 hr tablet TAKE 1 TABLET BY MOUTH ONCE A DAY IMMEDIATELY FOLLOWING A MEAL 90 tablet 3   Multiple Vitamin (MULTIVITAMIN) tablet Take 1 tablet by mouth daily.     potassium chloride (KLOR-CON) 10 MEQ tablet TAKE 1 TABLET BY MOUTH DAILY 90 tablet 0   sertraline (ZOLOFT) 100 MG tablet TAKE 1 TABLET BY MOUTH EVERY NIGHT AT BEDTIME 90 tablet 1   simvastatin (ZOCOR) 20 MG tablet Take 1 tablet (20 mg total) by mouth daily. 90 tablet 3   terbinafine (LAMISIL) 250 MG tablet TAKE 1 TABLET BY MOUTH ONCE A DAY 90 tablet 0   No current facility-administered medications on file prior to visit.    Social History   Tobacco Use   Smoking status: Former    Types: Cigarettes    Quit date: 04/10/1997    Years since quitting: 25.1   Smokeless tobacco:  Never  Vaping Use   Vaping Use: Never used  Substance Use Topics   Alcohol use: Not Currently    Alcohol/week: 1.0 standard drink of alcohol    Types: 1 Glasses of wine per week   Drug use: No    Review of Systems  Constitutional:  Negative for chills and fever.  Respiratory:  Negative for cough.   Cardiovascular:  Negative for chest pain and palpitations.  Gastrointestinal:  Negative for nausea and vomiting.  Skin:  Positive for rash.      Objective:    BP 128/70 (BP Location: Left Arm, Patient Position: Sitting, Cuff Size: Normal)   Pulse 81   Temp (!) 97.4 F (36.3 C) (Oral)   Ht '5\' 6"'$  (1.676 m)   Wt 182 lb 9.6 oz (82.8 kg)   SpO2 99%   BMI 29.47 kg/m  BP Readings from Last 3 Encounters:  06/02/22 128/70  01/25/22 138/78  01/21/22 126/78   Wt Readings from Last 3 Encounters:  06/02/22 182 lb 9.6 oz (82.8 kg)  01/25/22 177 lb 3.2 oz (80.4 kg)  01/21/22 174 lb (78.9 kg)    Physical Exam Vitals reviewed.  Constitutional:      Appearance: She is well-developed.  Eyes:     Conjunctiva/sclera: Conjunctivae normal.  Cardiovascular:     Rate and Rhythm: Normal rate and regular rhythm.     Pulses: Normal pulses.     Heart sounds: Normal heart sounds.  Pulmonary:     Effort: Pulmonary effort is normal.     Breath sounds: Normal breath sounds. No wheezing, rhonchi or rales.  Skin:    General: Skin is warm and dry.     Findings: Rash present. Rash is macular and papular.          Comments: Maculopapular lesions noted left side. Nonconfluent. No central clearing. No vesicles noted. No purulent discharge, increased warmth or red streaks.   Neurological:     Mental Status: She is alert.  Psychiatric:        Speech: Speech normal.        Behavior: Behavior normal.        Thought Content: Thought content normal.        Assessment & Plan:   Problem List Items Addressed This Visit       Cardiovascular and Mediastinum   Aortic atherosclerosis ([place])     Symptomatically stable.  Continue Zocor 20 mg      Essential hypertension, benign - Primary   Relevant Orders   Basic metabolic panel  HTN (hypertension)    Chronic, Stable.  Continue hctz 12.'5mg'$ , amlodipine '5mg'$ , losartan '100mg'$ , toprol '50mg'$ , potassium chloride 10 mEq        Musculoskeletal and Integument   Rash    Appearance consistent with folliculitis v impetigo. Presentation not consistent with tinea. Nonvescicular lesions and nonpainful so shingles is not likely. Start bactroban. Patient will let me know how she is doing. Consider doxycyline if rash persists.       Relevant Medications   mupirocin ointment (BACTROBAN) 2 %     Other   Depression, recurrent (HCC)    Chronic, stable.  Continue Zoloft 100 mg, Wellbutrin 300 mg         I am having Shahrzad B. Callander start on mupirocin ointment. I am also having her maintain her multivitamin, Vitamin D, cyclobenzaprine, calcium carbonate, metoprolol succinate, simvastatin, amLODipine, azelastine, terbinafine, hydrochlorothiazide, losartan, sertraline, buPROPion, and potassium chloride.   Meds ordered this encounter  Medications   mupirocin ointment (BACTROBAN) 2 %    Sig: Apply 1 Application topically 2 (two) times daily.    Dispense:  22 g    Refill:  2    Order Specific Question:   Supervising Provider    Answer:   Crecencio Mc [[id number]]    Return precautions given.   Risks, benefits, and alternatives of the medications and treatment plan prescribed today were discussed, and patient expressed understanding.   Education regarding symptom management and diagnosis given to patient on AVS.  Continue to follow with Burnard Hawthorne, FNP for routine health maintenance.   Michelle Clark and I agreed with plan.   Mable Paris, FNP

## 2022-06-02 NOTE — Assessment & Plan Note (Signed)
Symptomatically stable.  Continue Zocor 20 mg

## 2022-06-02 NOTE — Assessment & Plan Note (Signed)
Chronic, Stable.  Continue hctz 12.'5mg'$ , amlodipine '5mg'$ , losartan '100mg'$ , toprol '50mg'$ , potassium chloride 10 mEq

## 2022-06-02 NOTE — Assessment & Plan Note (Addendum)
Appearance consistent with folliculitis v impetigo. Presentation not consistent with tinea. Nonvescicular lesions and nonpainful so shingles is not likely. Start bactroban. Patient will let me know how she is doing. Consider doxycyline if rash persists.

## 2022-06-03 LAB — BASIC METABOLIC PANEL
BUN: 24 mg/dL — ABNORMAL HIGH (ref 6–23)
CO2: 26 mEq/L (ref 19–32)
Calcium: 10 mg/dL (ref 8.4–10.5)
Chloride: 103 mEq/L (ref 96–112)
Creatinine, Ser: 1.08 mg/dL (ref 0.40–1.20)
GFR: 49.58 mL/min — ABNORMAL LOW (ref >60.00)
Glucose, Bld: 74 mg/dL (ref 70–99)
Potassium: 3.8 mEq/L (ref 3.5–5.1)
Sodium: 135 mEq/L (ref 135–145)

## 2022-06-04 ENCOUNTER — Other Ambulatory Visit: Payer: Self-pay | Admitting: Family

## 2022-06-04 DIAGNOSIS — I10 Essential (primary) hypertension: Secondary | ICD-10-CM

## 2022-06-16 ENCOUNTER — Ambulatory Visit (INDEPENDENT_AMBULATORY_CARE_PROVIDER_SITE_OTHER): Payer: Medicare Other | Admitting: Podiatry

## 2022-06-16 DIAGNOSIS — B351 Tinea unguium: Secondary | ICD-10-CM

## 2022-06-16 MED ORDER — TERBINAFINE HCL 250 MG PO TABS: 250.0000 mg | ORAL_TABLET | Freq: Every day | ORAL | 0 refills | Status: DC

## 2022-06-17 ENCOUNTER — Other Ambulatory Visit: Payer: Self-pay | Admitting: Family

## 2022-06-17 DIAGNOSIS — E782 Mixed hyperlipidemia: Secondary | ICD-10-CM

## 2022-06-17 DIAGNOSIS — I10 Essential (primary) hypertension: Secondary | ICD-10-CM

## 2022-06-18 ENCOUNTER — Telehealth: Payer: Self-pay

## 2022-06-18 NOTE — Telephone Encounter (Signed)
LVM to call back to go over results 

## 2022-06-20 NOTE — Progress Notes (Signed)
  Subjective:  Patient ID: Michelle Clark, female    DOB: August 16, 1946,  MRN: [id number]  Chief Complaint  Patient presents with   Nail Problem    Thick painful toenails, 4  month follow up     76 y.o. female returns for follow-up with the above complaint. History confirmed with patient.  Doing well nearly fully healed  Objective:  Physical Exam: warm, good capillary refill, no trophic changes or ulcerative lesions, normal DP and PT pulses, and normal sensory exam. Left Foot: Mycotic hallux nail with yellow discoloration, 90% clearance approximately      Assessment:   1. Onychomycosis      Plan:  Patient was evaluated and treated and all questions answered.  Onychomycosis She is doing very well.  She has not had any ill effects from the Lamisil treatment.  About 90% clearance now.  She will take the next 3 months of Lamisil and she will return as needed  Return if symptoms worsen or fail to improve.

## 2022-06-21 DIAGNOSIS — H2512 Age-related nuclear cataract, left eye: Secondary | ICD-10-CM | POA: Diagnosis not present

## 2022-07-21 DIAGNOSIS — H2512 Age-related nuclear cataract, left eye: Secondary | ICD-10-CM | POA: Diagnosis not present

## 2022-07-28 ENCOUNTER — Other Ambulatory Visit: Payer: Self-pay | Admitting: Family

## 2022-07-28 DIAGNOSIS — I10 Essential (primary) hypertension: Secondary | ICD-10-CM

## 2022-07-30 ENCOUNTER — Other Ambulatory Visit: Payer: Medicare Other

## 2022-08-02 ENCOUNTER — Other Ambulatory Visit (INDEPENDENT_AMBULATORY_CARE_PROVIDER_SITE_OTHER): Payer: Medicare Other

## 2022-08-02 DIAGNOSIS — I10 Essential (primary) hypertension: Secondary | ICD-10-CM

## 2022-08-02 LAB — BASIC METABOLIC PANEL
BUN: 17 mg/dL (ref 6–23)
CO2: 27 mEq/L (ref 19–32)
Calcium: 9.6 mg/dL (ref 8.4–10.5)
Chloride: 103 mEq/L (ref 96–112)
Creatinine, Ser: 1.11 mg/dL (ref 0.40–1.20)
GFR: 47.92 mL/min — ABNORMAL LOW (ref >60.00)
Glucose, Bld: 82 mg/dL (ref 70–99)
Potassium: 4.3 mEq/L (ref 3.5–5.1)
Sodium: 137 mEq/L (ref 135–145)

## 2022-08-10 ENCOUNTER — Encounter: Payer: Self-pay | Admitting: Ophthalmology

## 2022-08-12 NOTE — Discharge Instructions (Signed)

## 2022-08-13 NOTE — Anesthesia Preprocedure Evaluation (Signed)
Anesthesia Evaluation  Patient identified by MRN, date of birth, ID band Patient awake    Reviewed: Allergy & Precautions, H&P , NPO status , Patient's Chart, lab work & pertinent test results, reviewed documented beta blocker date and time   Airway Mallampati: II  TM Distance: >3 FB Neck ROM: full    Dental no notable dental hx.    Pulmonary neg pulmonary ROS, former smoker,    Pulmonary exam normal breath sounds clear to auscultation       Cardiovascular Exercise Tolerance: Good hypertension,  Rhythm:regular Rate:Normal     Neuro/Psych PSYCHIATRIC DISORDERS Depression negative neurological ROS     GI/Hepatic negative GI ROS, Neg liver ROS,   Endo/Other  Diabetes: preDM.  Renal/GU negative Renal ROS   H/o R breast ca, s/p chemo/XRT/mastectomt negative genitourinary   Musculoskeletal   Abdominal Normal abdominal exam  (+)   Peds  Hematology negative hematology ROS (+)   Anesthesia Other Findings   Reproductive/Obstetrics negative OB ROS                            Anesthesia Physical  Anesthesia Plan  ASA: II  Anesthesia Plan: MAC   Post-op Pain Management:    Induction:   PONV Risk Score and Plan:   Airway Management Planned:   Additional Equipment:   Intra-op Plan:   Post-operative Plan:   Informed Consent: I have reviewed the patients History and Physical, chart, labs and discussed the procedure including the risks, benefits and alternatives for the proposed anesthesia with the patient or authorized representative who has indicated his/her understanding and acceptance.     Dental Advisory Given  Plan Discussed with: CRNA  Anesthesia Plan Comments:        Anesthesia Quick Evaluation

## 2022-08-17 ENCOUNTER — Encounter: Payer: Self-pay | Admitting: Ophthalmology

## 2022-08-17 ENCOUNTER — Ambulatory Visit (AMBULATORY_SURGERY_CENTER): Payer: Medicare Other | Admitting: Anesthesiology

## 2022-08-17 ENCOUNTER — Ambulatory Visit: Payer: Medicare Other | Admitting: Anesthesiology

## 2022-08-17 ENCOUNTER — Other Ambulatory Visit: Payer: Self-pay

## 2022-08-17 ENCOUNTER — Encounter
Admission: RE | Disposition: A | Discharge status: Home / Self Care | Payer: Self-pay | Source: Home / Self Care | Attending: Ophthalmology

## 2022-08-17 ENCOUNTER — Ambulatory Visit
Admission: RE | Admit: 2022-08-17 | Discharge: 2022-08-17 | Disposition: A | Discharge status: Home / Self Care | Payer: Medicare Other | Attending: Ophthalmology | Admitting: Ophthalmology

## 2022-08-17 DIAGNOSIS — Z87891 Personal history of nicotine dependence: Secondary | ICD-10-CM | POA: Diagnosis not present

## 2022-08-17 DIAGNOSIS — H2512 Age-related nuclear cataract, left eye: Secondary | ICD-10-CM | POA: Diagnosis not present

## 2022-08-17 DIAGNOSIS — R7303 Prediabetes: Secondary | ICD-10-CM | POA: Insufficient documentation

## 2022-08-17 DIAGNOSIS — F32A Depression, unspecified: Secondary | ICD-10-CM | POA: Insufficient documentation

## 2022-08-17 DIAGNOSIS — Z8249 Family history of ischemic heart disease and other diseases of the circulatory system: Secondary | ICD-10-CM | POA: Insufficient documentation

## 2022-08-17 DIAGNOSIS — Z853 Personal history of malignant neoplasm of breast: Secondary | ICD-10-CM | POA: Diagnosis not present

## 2022-08-17 DIAGNOSIS — Z9221 Personal history of antineoplastic chemotherapy: Secondary | ICD-10-CM | POA: Insufficient documentation

## 2022-08-17 DIAGNOSIS — Z923 Personal history of irradiation: Secondary | ICD-10-CM | POA: Diagnosis not present

## 2022-08-17 DIAGNOSIS — I10 Essential (primary) hypertension: Secondary | ICD-10-CM | POA: Insufficient documentation

## 2022-08-17 DIAGNOSIS — Z9011 Acquired absence of right breast and nipple: Secondary | ICD-10-CM | POA: Diagnosis not present

## 2022-08-17 HISTORY — PX: CATARACT EXTRACTION W/PHACO: SHX586

## 2022-08-17 SURGERY — PHACOEMULSIFICATION, CATARACT, WITH IOL INSERTION
Anesthesia: Monitor Anesthesia Care | Site: Eye | Laterality: Left

## 2022-08-17 MED ORDER — SIGHTPATH DOSE#1 BSS IO SOLN
INTRAOCULAR | Status: DC | PRN
  Administered 2022-08-17: 54 mL via OPHTHALMIC

## 2022-08-17 MED ORDER — SIGHTPATH DOSE#1 BSS IO SOLN
INTRAOCULAR | Status: DC | PRN
  Administered 2022-08-17: 15 mL

## 2022-08-17 MED ORDER — MIDAZOLAM HCL 2 MG/2ML IJ SOLN
INTRAMUSCULAR | Status: DC | PRN
  Administered 2022-08-17: 2 mg via INTRAVENOUS

## 2022-08-17 MED ORDER — BRIMONIDINE TARTRATE-TIMOLOL 0.2-0.5 % OP SOLN
OPHTHALMIC | Status: DC | PRN
  Administered 2022-08-17: 1 [drp] via OPHTHALMIC

## 2022-08-17 MED ORDER — SIGHTPATH DOSE#1 NA CHONDROIT SULF-NA HYALURON 40-17 MG/ML IO SOLN
INTRAOCULAR | Status: DC | PRN
  Administered 2022-08-17: 1 mL via INTRAOCULAR

## 2022-08-17 MED ORDER — TETRACAINE HCL 0.5 % OP SOLN
1.0000 [drp] | OPHTHALMIC | Status: DC | PRN
  Administered 2022-08-17 (×3): 1 [drp] via OPHTHALMIC

## 2022-08-17 MED ORDER — ARMC OPHTHALMIC DILATING DROPS
1.0000 | OPHTHALMIC | Status: DC | PRN
  Administered 2022-08-17 (×3): 1 via OPHTHALMIC

## 2022-08-17 MED ORDER — SIGHTPATH DOSE#1 BSS IO SOLN
INTRAOCULAR | Status: DC | PRN
  Administered 2022-08-17: 1 mL via INTRAMUSCULAR

## 2022-08-17 MED ORDER — MOXIFLOXACIN HCL 0.5 % OP SOLN
OPHTHALMIC | Status: DC | PRN
  Administered 2022-08-17: 0.2 mL via OPHTHALMIC

## 2022-08-17 MED ORDER — FENTANYL CITRATE (PF) 100 MCG/2ML IJ SOLN
INTRAMUSCULAR | Status: DC | PRN
  Administered 2022-08-17: 100 ug via INTRAVENOUS

## 2022-08-17 SURGICAL SUPPLY — 9 items
CATARACT SUITE SIGHTPATH (MISCELLANEOUS) ×1 IMPLANT
FEE CATARACT SUITE SIGHTPATH (MISCELLANEOUS) ×1 IMPLANT
GLOVE SURG ENC TEXT LTX SZ8 (GLOVE) ×1 IMPLANT
GLOVE SURG TRIUMPH 8.0 PF LTX (GLOVE) ×1 IMPLANT
LENS IOL TECNIS EYHANCE 22.0 (Intraocular Lens) IMPLANT
NDL FILTER BLUNT 18X1 1/2 (NEEDLE) ×1 IMPLANT
NEEDLE FILTER BLUNT 18X1 1/2 (NEEDLE) ×1 IMPLANT
SYR 3ML LL SCALE MARK (SYRINGE) ×1 IMPLANT
WATER STERILE IRR 250ML POUR (IV SOLUTION) ×1 IMPLANT

## 2022-08-17 NOTE — Op Note (Signed)
PREOPERATIVE DIAGNOSIS:  Nuclear sclerotic cataract of the left eye.   POSTOPERATIVE DIAGNOSIS:  Nuclear sclerotic cataract of the left eye.   OPERATIVE PROCEDURE:ORPROCALL@   SURGEON:  Birder Robson, MD.   ANESTHESIA:  Anesthesiologist: Iran Ouch, MD CRNA: Tobie Poet, CRNA  1.      Managed anesthesia care. 2.     0.63m of Shugarcaine was instilled following the paracentesis   COMPLICATIONS:  None.   TECHNIQUE:   Stop and chop   DESCRIPTION OF PROCEDURE:  The patient was examined and consented in the preoperative holding area where the aforementioned topical anesthesia was applied to the left eye and then brought back to the Operating Room where the left eye was prepped and draped in the usual sterile ophthalmic fashion and a lid speculum was placed. A paracentesis was created with the side port blade and the anterior chamber was filled with viscoelastic. A near clear corneal incision was performed with the steel keratome. A continuous curvilinear capsulorrhexis was performed with a cystotome followed by the capsulorrhexis forceps. Hydrodissection and hydrodelineation were carried out with BSS on a blunt cannula. The lens was removed in a stop and chop  technique and the remaining cortical material was removed with the irrigation-aspiration handpiece. The capsular bag was inflated with viscoelastic and the Technis [id number] lens was placed in the capsular bag without complication. The remaining viscoelastic was removed from the eye with the irrigation-aspiration handpiece. The wounds were hydrated. The anterior chamber was flushed with BSS and the eye was inflated to physiologic pressure. 0.114mVigamox was placed in the anterior chamber. The wounds were found to be water tight. The eye was dressed with Combigan. The patient was given protective glasses to wear throughout the day and a shield with which to sleep tonight. The patient was also given drops with which to begin a  drop regimen today and will follow-up with me in one day. Implant Name Type Inv. Item Serial No. Manufacturer Lot No. LRB No. Used Action  LENS IOL TECNIS EYHANCE 22.0 - [id number] Lens LENS IOL TECNIS EYHANCE 22.0 [id number]  Left 1 Implanted    Procedure(s): CATARACT EXTRACTION PHACO AND INTRAOCULAR LENS PLACEMENT (IOC) LEFT 6.76 00:39.8  (Left)  Electronically signed: WiBirder Robson/26/2023 1:42 PM

## 2022-08-17 NOTE — H&P (Signed)
[facility]   Primary Care Physician:  Burnard Hawthorne, FNP Ophthalmologist: Dr. George Ina  Pre-Procedure History & Physical: HPI:  Michelle Clark is a 76 y.o. female here for cataract surgery.   Past Medical History:  Diagnosis Date   Cancer [facility]) 2001   Right breast, s/p chemotherapy, XRT and mastectomy, Dr. Jeb Levering   Colon polyps    Hyperlipidemia    Hypertension    Personal history of chemotherapy    Personal history of radiation therapy    Pre-diabetes     Past Surgical History:  Procedure Laterality Date   ABDOMINAL HYSTERECTOMY  1998   cervix intact   APPENDECTOMY     BREAST SURGERY  2002   mastectomy   CATARACT EXTRACTION W/PHACO Right 10/09/2019   Procedure: CATARACT EXTRACTION PHACO AND INTRAOCULAR LENS PLACEMENT (IOC) RIGHT DIABETIC 9.43, 00:53;  Surgeon: Birder Robson, MD;  Location: [place];  Service: Ophthalmology;  Laterality: Right;   CHOLECYSTECTOMY     COLONOSCOPY WITH PROPOFOL N/A 01/08/2020   Procedure: COLONOSCOPY WITH PROPOFOL;  Surgeon: Lin Landsman, MD;  Location: [place];  Service: Gastroenterology;  Laterality: N/A;   MASTECTOMY Right 2001   chemo, rad   OOPHORECTOMY     POLYPECTOMY  01/08/2020   Procedure: POLYPECTOMY;  Surgeon: Lin Landsman, MD;  Location: [place];  Service: Gastroenterology;;   TUBAL LIGATION      Prior to Admission medications   Medication Sig Start Date End Date Taking? Authorizing Provider  amLODipine (NORVASC) 5 MG tablet TAKE 1 TABLET BY MOUTH ONCE DAILY 06/17/22  Yes Dutch Quint B, FNP  azelastine (ASTELIN) 0.1 % nasal spray Place 1 spray into both nostrils 2 (two) times daily. Use in each nostril as directed 10/26/21  Yes Burnard Hawthorne, FNP  buPROPion (WELLBUTRIN XL) 300 MG 24 hr tablet TAKE 1 TABLET BY MOUTH EVERY MORNING 03/18/22  Yes Arnett, Yvetta Coder, FNP  calcium carbonate (OSCAL) 1500 (600 Ca) MG TABS tablet Take 600 mg of elemental calcium by  mouth daily with breakfast.   Yes [provider]  Cholecalciferol (VITAMIN D) 2000 units tablet Take 1 tablet by mouth daily.   Yes [provider]  cyclobenzaprine (FLEXERIL) 5 MG tablet TAKE 1 TABLET BY MOUTH EVERY NIGHT AT BEDTIME AS NEEDED FOR MUSCLE SPASMS 11/21/19  Yes Burnard Hawthorne, FNP  hydrochlorothiazide (MICROZIDE) 12.5 MG capsule TAKE 1 CAPSULE BY MOUTH ONCE DAILY 02/15/22  Yes Dutch Quint B, FNP  losartan (COZAAR) 100 MG tablet TAKE 1 TABLET BY MOUTH ONCE DAILY 03/18/22  Yes Leone Haven, MD  metoprolol succinate (TOPROL-XL) 50 MG 24 hr tablet TAKE 1 TABLET BY MOUTH ONCE A DAY IMMEDIATELY FOLLOWING A MEAL 07/28/22  Yes Dutch Quint B, FNP  Multiple Vitamin (MULTIVITAMIN) tablet Take 1 tablet by mouth daily.   Yes [provider]  mupirocin ointment (BACTROBAN) 2 % Apply 1 Application topically 2 (two) times daily. 06/02/22  Yes Arnett, Yvetta Coder, FNP  potassium chloride (KLOR-CON) 10 MEQ tablet TAKE 1 TABLET BY MOUTH DAILY 07/28/22  Yes Dutch Quint B, FNP  sertraline (ZOLOFT) 100 MG tablet TAKE 1 TABLET BY MOUTH EVERY NIGHT AT BEDTIME 03/18/22  Yes Burnard Hawthorne, FNP  simvastatin (ZOCOR) 20 MG tablet TAKE 1 TABLET BY MOUTH ONCE A DAY 06/17/22  Yes Dutch Quint B, FNP  terbinafine (LAMISIL) 250 MG tablet Take 1 tablet (250 mg total) by mouth daily. 06/16/22  Yes Criselda Peaches, DPM  Allergies as of 06/28/2022 - Review Complete 06/02/2022  Allergen Reaction Noted   Fluoxetine  03/03/2015    Family History  Problem Relation Age of Onset   Arthritis Mother    Stroke Mother 65   Arthritis Sister    Stroke Sister 91       paralyzed left side   Breast cancer Sister 18   Cancer Sister        breast   Heart disease Brother    Asthma Brother    Colon cancer Brother    Lupus Daughter    Lung cancer Son    Breast cancer Cousin        maternal - 17's   Breast cancer Other     Social History   Socioeconomic History   Marital  status: Married    Spouse name: Not on file   Number of children: Not on file   Years of education: Not on file   Highest education level: Not on file  Occupational History   Not on file  Tobacco Use   Smoking status: Former    Types: Cigarettes    Quit date: 04/10/1997    Years since quitting: 25.3   Smokeless tobacco: Never  Vaping Use   Vaping Use: Never used  Substance and Sexual Activity   Alcohol use: Not Currently    Alcohol/week: 1.0 standard drink of alcohol    Types: 1 Glasses of wine per week   Drug use: No   Sexual activity: Not Currently  Other Topics Concern   Not on file  Social History Narrative   Lives in [place] with husband. No pets      Work - retired, Surveyor, minerals      Diet - regular      Exercise - none   Social Determinants of Health   Financial Resource Strain: [place]  (09/30/2021)   Overall Financial Resource Strain (CARDIA)    Difficulty of Paying Living Expenses: Not hard at all  Food Insecurity: No Food Insecurity (09/30/2021)   Hunger Vital Sign    Worried About Running Out of Food in the Last Year: Never true    [place] in the Last Year: Never true  Transportation Needs: No Transportation Needs (09/30/2021)   PRAPARE - Hydrologist (Medical): No    Lack of Transportation (Non-Medical): No  Physical Activity: Insufficiently Active (09/30/2021)   Exercise Vital Sign    Days of Exercise per Week: 2 days    Minutes of Exercise per Session: 20 min  Stress: No Stress Concern Present (09/30/2021)   [place]    Feeling of Stress : Not at all  Social Connections: Unknown (09/30/2021)   Social Connection and Isolation Panel [NHANES]    Frequency of Communication with Friends and Family: More than three times a week    Frequency of Social Gatherings with Friends and Family: More than three times a week    Attends Religious Services: Not  on file    Active Member of Clubs or Organizations: Not on file    Attends Archivist Meetings: Not on file    Marital Status: Married  Intimate Partner Violence: Not At Risk (09/30/2021)   Humiliation, Afraid, Rape, and Kick questionnaire    Fear of Current or Ex-Partner: No    Emotionally Abused: No    Physically Abused: No    Sexually Abused: No  Review of Systems: See HPI, otherwise negative ROS  Physical Exam: BP (!) 151/78   Pulse 67   Temp 97.9 F (36.6 C) (Temporal)   Resp 16   Ht '5\' 6"'$  (1.676 m)   Wt 82.6 kg   SpO2 99%   BMI 29.38 kg/m  General:   Alert, cooperative in NAD Head:  Normocephalic and atraumatic. Respiratory:  Normal work of breathing. Cardiovascular:  RRR  Impression/Plan: Michelle Clark is here for cataract surgery.  Risks, benefits, limitations, and alternatives regarding cataract surgery have been reviewed with the patient.  Questions have been answered.  All parties agreeable.   Birder Robson, MD  08/17/2022, 1:18 PM

## 2022-08-17 NOTE — Anesthesia Postprocedure Evaluation (Signed)
Anesthesia Post Note  Patient: Michelle Clark  Procedure(s) Performed: CATARACT EXTRACTION PHACO AND INTRAOCULAR LENS PLACEMENT (IOC) LEFT 6.76 00:39.8  (Left: Eye)     Patient location during evaluation: PACU Anesthesia Type: MAC Level of consciousness: awake and alert Pain management: pain level controlled Vital Signs Assessment: post-procedure vital signs reviewed and stable Respiratory status: spontaneous breathing, nonlabored ventilation and respiratory function stable Cardiovascular status: blood pressure returned to baseline and stable Postop Assessment: no apparent nausea or vomiting Anesthetic complications: no   No notable events documented.  Iran Ouch

## 2022-08-17 NOTE — Transfer of Care (Signed)
Immediate Anesthesia Transfer of Care Note  Patient: Michelle Clark  Procedure(s) Performed: CATARACT EXTRACTION PHACO AND INTRAOCULAR LENS PLACEMENT (IOC) LEFT 6.76 00:39.8  (Left: Eye)  Patient Location: PACU  Anesthesia Type: MAC  Level of Consciousness: awake, alert  and patient cooperative  Airway and Oxygen Therapy: Patient Spontanous Breathing and Patient connected to supplemental oxygen  Post-op Assessment: Post-op Vital signs reviewed, Patient's Cardiovascular Status Stable, Respiratory Function Stable, Patent Airway and No signs of Nausea or vomiting  Post-op Vital Signs: Reviewed and stable  Complications: No notable events documented.

## 2022-08-18 ENCOUNTER — Encounter: Payer: Self-pay | Admitting: Ophthalmology

## 2022-08-23 ENCOUNTER — Encounter: Payer: Medicare Other | Admitting: Family

## 2022-08-25 ENCOUNTER — Telehealth: Payer: Self-pay | Admitting: Family

## 2022-08-25 DIAGNOSIS — I10 Essential (primary) hypertension: Secondary | ICD-10-CM

## 2022-08-30 ENCOUNTER — Encounter: Payer: Self-pay | Admitting: Family

## 2022-08-30 ENCOUNTER — Ambulatory Visit (INDEPENDENT_AMBULATORY_CARE_PROVIDER_SITE_OTHER): Payer: Medicare Other | Admitting: Family

## 2022-08-30 VITALS — BP 130/80 | HR 77 | Temp 97.5°F | Ht 66.0 in | Wt 183.2 lb

## 2022-08-30 DIAGNOSIS — Z780 Asymptomatic menopausal state: Secondary | ICD-10-CM

## 2022-08-30 DIAGNOSIS — I10 Essential (primary) hypertension: Secondary | ICD-10-CM

## 2022-08-30 DIAGNOSIS — Z23 Encounter for immunization: Secondary | ICD-10-CM | POA: Diagnosis not present

## 2022-08-30 DIAGNOSIS — F339 Major depressive disorder, recurrent, unspecified: Secondary | ICD-10-CM | POA: Diagnosis not present

## 2022-08-30 DIAGNOSIS — Z0000 Encounter for general adult medical examination without abnormal findings: Secondary | ICD-10-CM

## 2022-08-30 DIAGNOSIS — R7303 Prediabetes: Secondary | ICD-10-CM | POA: Diagnosis not present

## 2022-08-30 DIAGNOSIS — I700 Atherosclerosis of aorta: Secondary | ICD-10-CM

## 2022-08-30 LAB — COMPREHENSIVE METABOLIC PANEL
ALT: 22 U/L (ref 0–35)
AST: 21 U/L (ref 0–37)
Albumin: 4.4 g/dL (ref 3.5–5.2)
Alkaline Phosphatase: 82 U/L (ref 39–117)
BUN: 18 mg/dL (ref 6–23)
CO2: 26 mEq/L (ref 19–32)
Calcium: 9.7 mg/dL (ref 8.4–10.5)
Chloride: 101 mEq/L (ref 96–112)
Creatinine, Ser: 0.96 mg/dL (ref 0.40–1.20)
GFR: 57.01 mL/min — ABNORMAL LOW (ref >60.00)
Glucose, Bld: 78 mg/dL (ref 70–99)
Potassium: 4.1 mEq/L (ref 3.5–5.1)
Sodium: 137 mEq/L (ref 135–145)
Total Bilirubin: 0.4 mg/dL (ref 0.2–1.2)
Total Protein: 7.6 g/dL (ref 6.0–8.3)

## 2022-08-30 LAB — LIPID PANEL
Cholesterol: 159 mg/dL (ref 0–200)
HDL: 59 mg/dL (ref >39.00)
LDL Cholesterol: 80 mg/dL (ref 0–99)
NonHDL: 100.24
Total CHOL/HDL Ratio: 3
Triglycerides: 101 mg/dL (ref 0.0–149.0)
VLDL: 20.2 mg/dL (ref 0.0–40.0)

## 2022-08-30 LAB — HEMOGLOBIN A1C: Hgb A1c MFr Bld: 5.9 % (ref 4.6–6.5)

## 2022-08-30 LAB — TSH: TSH: 3.36 u[IU]/mL (ref 0.35–5.50)

## 2022-08-30 LAB — VITAMIN D 25 HYDROXY (VIT D DEFICIENCY, FRACTURES): VITD: 62 ng/mL (ref 30.00–100.00)

## 2022-08-30 NOTE — Patient Instructions (Addendum)
Please call  and schedule your 3D mammogram and /or bone density scan as we discussed.   [facility]  ( new location in 2023)  [address], [place], [place] [address]  [place], [place]  [phone]   Health Maintenance for Postmenopausal Women Menopause is a normal process in which your ability to get pregnant comes to an end. This process happens slowly over many months or years, usually between the ages of 48 and 55. Menopause is complete when you have missed your menstrual period for 12 months. It is important to talk with your health care provider about some of the most common conditions that affect women after menopause (postmenopausal women). These include heart disease, cancer, and bone loss (osteoporosis). Adopting a healthy lifestyle and getting preventive care can help to promote your health and wellness. The actions you take can also lower your chances of developing some of these common conditions. What are the signs and symptoms of menopause? During menopause, you may have the following symptoms: Hot flashes. These can be moderate or severe. Night sweats. Decrease in sex drive. Mood swings. Headaches. Tiredness (fatigue). Irritability. Memory problems. Problems falling asleep or staying asleep. Talk with your health care provider about treatment options for your symptoms. Do I need hormone replacement therapy? Hormone replacement therapy is effective in treating symptoms that are caused by menopause, such as hot flashes and night sweats. Hormone replacement carries certain risks, especially as you become older. If you are thinking about using estrogen or estrogen with progestin, discuss the benefits and risks with your health care provider. How can I reduce my risk for heart disease and stroke? The risk of heart disease, heart attack, and stroke increases as you age. One of the causes may be a change in the body's hormones during menopause. This can  affect how your body uses dietary fats, triglycerides, and cholesterol. Heart attack and stroke are medical emergencies. There are many things that you can do to help prevent heart disease and stroke. Watch your blood pressure High blood pressure causes heart disease and increases the risk of stroke. This is more likely to develop in people who have high blood pressure readings or are overweight. Have your blood pressure checked: Every 3-5 years if you are 18-39 years of age. Every year if you are 40 years old or older. Eat a healthy diet  Eat a diet that includes plenty of vegetables, fruits, low-fat dairy products, and lean protein. Do not eat a lot of foods that are high in solid fats, added sugars, or sodium. Get regular exercise Get regular exercise. This is one of the most important things you can do for your health. Most adults should: Try to exercise for at least 150 minutes each week. The exercise should increase your heart rate and make you sweat (moderate-intensity exercise). Try to do strengthening exercises at least twice each week. Do these in addition to the moderate-intensity exercise. Spend less time sitting. Even light physical activity can be beneficial. Other tips Work with your health care provider to achieve or maintain a healthy weight. Do not use any products that contain nicotine or tobacco. These products include cigarettes, chewing tobacco, and vaping devices, such as e-cigarettes. If you need help quitting, ask your health care provider. Know your numbers. Ask your health care provider to check your cholesterol and your blood sugar (glucose). Continue to have your blood tested as directed by your health care provider. Do I need screening for cancer? Depending   on your health history and family history, you may need to have cancer screenings at different stages of your life. This may include screening for: Breast cancer. Cervical cancer. Lung cancer. Colorectal  cancer. What is my risk for osteoporosis? After menopause, you may be at increased risk for osteoporosis. Osteoporosis is a condition in which bone destruction happens more quickly than new bone creation. To help prevent osteoporosis or the bone fractures that can happen because of osteoporosis, you may take the following actions: If you are 19-50 years old, get at least 1,000 mg of calcium and at least 600 international units (IU) of vitamin D per day. If you are older than age 50 but younger than age 70, get at least 1,200 mg of calcium and at least 600 international units (IU) of vitamin D per day. If you are older than age 70, get at least 1,200 mg of calcium and at least 800 international units (IU) of vitamin D per day. Smoking and drinking excessive alcohol increase the risk of osteoporosis. Eat foods that are rich in calcium and vitamin D, and do weight-bearing exercises several times each week as directed by your health care provider. How does menopause affect my mental health? Depression may occur at any age, but it is more common as you become older. Common symptoms of depression include: Feeling depressed. Changes in sleep patterns. Changes in appetite or eating patterns. Feeling an overall lack of motivation or enjoyment of activities that you previously enjoyed. Frequent crying spells. Talk with your health care provider if you think that you are experiencing any of these symptoms. General instructions See your health care provider for regular wellness exams and vaccines. This may include: Scheduling regular health, dental, and eye exams. Getting and maintaining your vaccines. These include: Influenza vaccine. Get this vaccine each year before the flu season begins. Pneumonia vaccine. Shingles vaccine. Tetanus, diphtheria, and pertussis (Tdap) booster vaccine. Your health care provider may also recommend other immunizations. Tell your health care provider if you have ever been  abused or do not feel safe at home. Summary Menopause is a normal process in which your ability to get pregnant comes to an end. This condition causes hot flashes, night sweats, decreased interest in sex, mood swings, headaches, or lack of sleep. Treatment for this condition may include hormone replacement therapy. Take actions to keep yourself healthy, including exercising regularly, eating a healthy diet, watching your weight, and checking your blood pressure and blood sugar levels. Get screened for cancer and depression. Make sure that you are up to date with all your vaccines. This information is not intended to replace advice given to you by your health care provider. Make sure you discuss any questions you have with your health care provider. Document Revised: 03/30/2021 Document Reviewed: 03/30/2021 Elsevier Patient Education  2023 [employer]vier Inc.  

## 2022-08-30 NOTE — Assessment & Plan Note (Signed)
Left clinical breast exam performed today.  Deferred pelvic exam in the absence of complaints and patient is no longer screening for cervical cancer.  I have ordered mammogram and bone density which she will schedule.  Encouraged her to start walking program

## 2022-08-30 NOTE — Assessment & Plan Note (Signed)
Chronic, stable.  Continue simvastatin 20 mg

## 2022-08-30 NOTE — Assessment & Plan Note (Signed)
Chronic, stable.continue  Wellbutrin 300 mg, zoloft 100mg 

## 2022-08-30 NOTE — Progress Notes (Addendum)
Subjective:    Patient ID: Michelle Clark, female    DOB: 04-24-1946, 76 y.o.   MRN: [id number]  CC: Michelle Clark is a 76 y.o. female who presents today for physical exam.    HPI: Feels well today.  No new complaints    Depression-compliant with Wellbutrin 300 mg, zoloft '100mg'$ .  She feels regimen is working well Atherosclerosis-compliant with simvastatin 20 mg  Hypertension-compliant with amlodipine 5 mg, hydrochlorothiazide 12.'5mg'$ , losartan '100mg'$ , toprol '50mg'$  qd, potassium chloride 10 mEq daily. No cp  colorectal Cancer Screening: UTD , Dr. Marius Ditch 01/08/2020.  Repeat in 5 years Breast Cancer Screening: Mammogram UTD, with left screening mammogram.  History of right mastectomy   Cervical Cancer Screening: History of a hysterectomy for noncancerous reason, fibroids. Last pap smear : Negative malignancy, HPV in 10/16/2013  Bone Health screening/DEXA for 65+: UTD  Lung Cancer Screening: Doesn't have 20 year pack year history and age > 11 years yo 47 years  AAA screen for all men and women aged 54 to 18 with h/o tobacco, men 97 years older with family h/o AAA and women 97 years or older who have ever smoked or have family history of AAA.        Tetanus - UTD        Labs: Screening labs today. Exercise: No regular exercise.   Alcohol use:  none Smoking/tobacco use: Nonsmoker.     HISTORY:  Past Medical History:  Diagnosis Date   Cancer [facility]) 2001   Right breast, s/p chemotherapy, XRT and mastectomy, Dr. Jeb Levering   Colon polyps    Hyperlipidemia    Hypertension    Personal history of chemotherapy    Personal history of radiation therapy    Pre-diabetes     Past Surgical History:  Procedure Laterality Date   ABDOMINAL HYSTERECTOMY  1998   cervix intact; for noncancerous reason, fibroids.   APPENDECTOMY     BREAST SURGERY  2002   mastectomy   CATARACT EXTRACTION W/PHACO Right 10/09/2019   Procedure: CATARACT EXTRACTION PHACO AND INTRAOCULAR LENS PLACEMENT (IOC) RIGHT  DIABETIC 9.43, 00:53;  Surgeon: Birder Robson, MD;  Location: [facility];  Service: Ophthalmology;  Laterality: Right;   CATARACT EXTRACTION W/PHACO Left 08/17/2022   Procedure: CATARACT EXTRACTION PHACO AND INTRAOCULAR LENS PLACEMENT (IOC) LEFT 6.76 00:39.8 ;  Surgeon: Birder Robson, MD;  Location: [facility];  Service: Ophthalmology;  Laterality: Left;   CHOLECYSTECTOMY     COLONOSCOPY WITH PROPOFOL N/A 01/08/2020   Procedure: COLONOSCOPY WITH PROPOFOL;  Surgeon: Lin Landsman, MD;  Location: [facility];  Service: Gastroenterology;  Laterality: N/A;   MASTECTOMY Right 2001   chemo, rad   OOPHORECTOMY     POLYPECTOMY  01/08/2020   Procedure: POLYPECTOMY;  Surgeon: Lin Landsman, MD;  Location: [facility];  Service: Gastroenterology;;   TUBAL LIGATION     Family History  Problem Relation Age of Onset   Arthritis Mother    Stroke Mother 9   Arthritis Sister    Stroke Sister 25       paralyzed left side   Breast cancer Sister 99   Cancer Sister        breast   Heart disease Brother    Asthma Brother    Colon cancer Brother    Lupus Daughter    Lung cancer Son    Breast cancer Cousin        maternal - 30's   Breast cancer Other  ALLERGIES: Fluoxetine  Current Outpatient Medications on File Prior to Visit  Medication Sig Dispense Refill   amLODipine (NORVASC) 5 MG tablet TAKE 1 TABLET BY MOUTH ONCE DAILY 90 tablet 3   azelastine (ASTELIN) 0.1 % nasal spray Place 1 spray into both nostrils 2 (two) times daily. Use in each nostril as directed 30 mL 4   buPROPion (WELLBUTRIN XL) 300 MG 24 hr tablet TAKE 1 TABLET BY MOUTH EVERY MORNING 90 tablet 1   calcium carbonate (OSCAL) 1500 (600 Ca) MG TABS tablet Take 600 mg of elemental calcium by mouth daily with breakfast.     Cholecalciferol (VITAMIN D) 2000 units tablet Take 1 tablet by mouth daily.     cyclobenzaprine (FLEXERIL) 5 MG tablet TAKE 1 TABLET BY MOUTH EVERY NIGHT  AT BEDTIME AS NEEDED FOR MUSCLE SPASMS 30 tablet 0   losartan (COZAAR) 100 MG tablet TAKE 1 TABLET BY MOUTH ONCE DAILY 90 tablet 3   metoprolol succinate (TOPROL-XL) 50 MG 24 hr tablet TAKE 1 TABLET BY MOUTH ONCE A DAY IMMEDIATELY FOLLOWING A MEAL 90 tablet 3   Multiple Vitamin (MULTIVITAMIN) tablet Take 1 tablet by mouth daily.     mupirocin ointment (BACTROBAN) 2 % Apply 1 Application topically 2 (two) times daily. 22 g 2   potassium chloride (KLOR-CON) 10 MEQ tablet TAKE 1 TABLET BY MOUTH DAILY 90 tablet 0   sertraline (ZOLOFT) 100 MG tablet TAKE 1 TABLET BY MOUTH EVERY NIGHT AT BEDTIME 90 tablet 1   simvastatin (ZOCOR) 20 MG tablet TAKE 1 TABLET BY MOUTH ONCE A DAY 90 tablet 3   terbinafine (LAMISIL) 250 MG tablet Take 1 tablet (250 mg total) by mouth daily. 90 tablet 0   No current facility-administered medications on file prior to visit.    Social History   Tobacco Use   Smoking status: Former    Types: Cigarettes    Quit date: 04/10/1997    Years since quitting: 25.4   Smokeless tobacco: Never  Vaping Use   Vaping Use: Never used  Substance Use Topics   Alcohol use: Not Currently    Alcohol/week: 1.0 standard drink of alcohol    Types: 1 Glasses of wine per week   Drug use: No    Review of Systems  Constitutional:  Negative for chills, fever and unexpected weight change.  HENT:  Negative for congestion.   Respiratory:  Negative for cough.   Cardiovascular:  Negative for chest pain, palpitations and leg swelling.  Gastrointestinal:  Negative for nausea and vomiting.  Musculoskeletal:  Negative for arthralgias and myalgias.  Skin:  Negative for rash.  Neurological:  Negative for headaches.  Hematological:  Negative for adenopathy.  Psychiatric/Behavioral:  Negative for confusion.       Objective:    BP 130/80 (BP Location: Left Arm, Patient Position: Sitting, Cuff Size: Normal)   Pulse 77   Temp (!) 97.5 F (36.4 C) (Oral)   Ht '5\' 6"'$  (1.676 m)   Wt 183 lb 3.2 oz  (83.1 kg)   SpO2 99%   BMI 29.57 kg/m   BP Readings from Last 3 Encounters:  08/30/22 130/80  08/17/22 125/72  06/02/22 128/70   Wt Readings from Last 3 Encounters:  08/30/22 183 lb 3.2 oz (83.1 kg)  08/17/22 182 lb (82.6 kg)  06/02/22 182 lb 9.6 oz (82.8 kg)    Physical Exam Vitals reviewed.  Constitutional:      Appearance: Normal appearance. She is well-developed.  Eyes:     Conjunctiva/sclera:  Conjunctivae normal.  Neck:     Thyroid: No thyroid mass or thyromegaly.  Cardiovascular:     Rate and Rhythm: Normal rate and regular rhythm.     Pulses: Normal pulses.     Heart sounds: Normal heart sounds.  Pulmonary:     Effort: Pulmonary effort is normal.     Breath sounds: Normal breath sounds. No wheezing, rhonchi or rales.  Chest:  Breasts:    Breasts are symmetrical.     Right: Absent.     Left: No inverted nipple, mass, nipple discharge, skin change or tenderness.  Abdominal:     General: Bowel sounds are normal. There is no distension.     Palpations: Abdomen is soft. Abdomen is not rigid. There is no fluid wave or mass.     Tenderness: There is no abdominal tenderness. There is no guarding or rebound.  Lymphadenopathy:     Head:     Right side of head: No submental, submandibular, tonsillar, preauricular, posterior auricular or occipital adenopathy.     Left side of head: No submental, submandibular, tonsillar, preauricular, posterior auricular or occipital adenopathy.     Cervical: No cervical adenopathy.     Right cervical: No superficial, deep or posterior cervical adenopathy.    Left cervical: No superficial, deep or posterior cervical adenopathy.  Skin:    General: Skin is warm and dry.  Neurological:     Mental Status: She is alert.  Psychiatric:        Speech: Speech normal.        Behavior: Behavior normal.        Thought Content: Thought content normal.        Assessment & Plan:   Problem List Items Addressed This Visit        Cardiovascular and Mediastinum   Aortic atherosclerosis (HCC)    Chronic, stable.  Continue simvastatin 20 mg      Relevant Orders   Lipid panel (Completed)   HTN (hypertension)    Chronic, stable.  Continue amlodipine 5 mg, hydrochlorothiazide 12.'5mg'$ , losartan '100mg'$ , toprol '50mg'$  qd, potassium chloride 10 mEq daily.      Relevant Orders   TSH (Completed)     Other   Depression, recurrent (HCC)    Chronic, stable.continue  Wellbutrin 300 mg, zoloft '100mg'$       Relevant Orders   TSH (Completed)   Prediabetes   Relevant Orders   Hemoglobin A1c (Completed)   Routine health maintenance - Primary    Left clinical breast exam performed today.  Deferred pelvic exam in the absence of complaints and patient is no longer screening for cervical cancer.  I have ordered mammogram and bone density which she will schedule.  Encouraged her to start walking program      Relevant Orders   Comprehensive metabolic panel (Completed)   Hemoglobin A1c (Completed)   Lipid panel (Completed)   TSH (Completed)   VITAMIN D 25 Hydroxy (Vit-D Deficiency, Fractures) (Completed)   MM 3D SCREEN BREAST UNI LEFT   DG Bone Density   Other Visit Diagnoses     Need for immunization against influenza       Relevant Orders   Flu Vaccine QUAD High Dose(Fluad) (Completed)   Asymptomatic menopausal state       Relevant Orders   DG Bone Density        I am having Michelle Clark maintain her multivitamin, Vitamin D, cyclobenzaprine, calcium carbonate, azelastine, losartan, sertraline, buPROPion, mupirocin ointment, terbinafine, amLODipine, simvastatin, metoprolol  succinate, and potassium chloride.   No orders of the defined types were placed in this encounter.   Return precautions given.   Risks, benefits, and alternatives of the medications and treatment plan prescribed today were discussed, and patient expressed understanding.   Education regarding symptom management and diagnosis given to patient on  AVS.   Continue to follow with Burnard Hawthorne, FNP for routine health maintenance.   Michelle Clark and I agreed with plan.   Mable Paris, FNP

## 2022-08-30 NOTE — Assessment & Plan Note (Signed)
Chronic, stable.  Continue amlodipine 5 mg, hydrochlorothiazide 12.5mg, losartan 100mg, toprol 50mg qd, potassium chloride 10 mEq daily. 

## 2022-09-07 ENCOUNTER — Other Ambulatory Visit: Payer: Self-pay

## 2022-09-07 DIAGNOSIS — I10 Essential (primary) hypertension: Secondary | ICD-10-CM

## 2022-09-07 MED ORDER — HYDROCHLOROTHIAZIDE 12.5 MG PO CAPS: 12.5000 mg | ORAL_CAPSULE | Freq: Every day | ORAL | 2 refills | Status: DC

## 2022-09-07 NOTE — Telephone Encounter (Signed)
Pt need a refill on hydrochlorothiazide sent to [facility]

## 2022-09-07 NOTE — Telephone Encounter (Signed)
RX sent - pt aware  

## 2022-09-15 ENCOUNTER — Other Ambulatory Visit: Payer: Self-pay | Admitting: Family

## 2022-09-15 DIAGNOSIS — F339 Major depressive disorder, recurrent, unspecified: Secondary | ICD-10-CM

## 2022-09-15 NOTE — Telephone Encounter (Signed)
Rx sent pt is aware 

## 2022-10-01 ENCOUNTER — Ambulatory Visit (INDEPENDENT_AMBULATORY_CARE_PROVIDER_SITE_OTHER): Payer: Medicare Other

## 2022-10-01 VITALS — Ht 66.0 in | Wt 183.0 lb

## 2022-10-01 DIAGNOSIS — Z0000 Encounter for general adult medical examination without abnormal findings: Secondary | ICD-10-CM | POA: Diagnosis not present

## 2022-10-01 NOTE — Patient Instructions (Addendum)
Michelle Clark , Thank you for taking time to come for your Medicare Wellness Visit. I appreciate your ongoing commitment to your health goals. Please review the following plan we discussed and let me know if I can assist you in the future.   These are the goals we discussed:  Goals       Patient Stated     Low carb diet (pt-stated)      Monitor sugar intake      Other     I would like to continue losing weight      Stay active Healthy diet Stay hydrated        This is a list of the screening recommended for you and due dates:  Health Maintenance  Topic Date Due   COVID-19 Vaccine (3 - Moderna risk series) 02/08/2023*   Zoster (Shingles) Vaccine (1 of 2) 05/12/2023*   Medicare Annual Wellness Visit  10/02/2023   Colon Cancer Screening  01/07/2025   Tetanus Vaccine  09/15/2026   Pneumonia Vaccine  Completed   Flu Shot  Completed   DEXA scan (bone density measurement)  Completed   Hepatitis C Screening: USPSTF Recommendation to screen - Ages 2-79 yo.  Completed   HPV Vaccine  Aged Out  *Topic was postponed. The date shown is not the original due date.    Advanced directives: on file  Conditions/risks identified: none new  Next appointment: Follow up in one year for your annual wellness visit    Preventive Care 65 Years and Older, Female Preventive care refers to lifestyle choices and visits with your health care provider that can promote health and wellness. What does preventive care include? A yearly physical exam. This is also called an annual well check. Dental exams once or twice a year. Routine eye exams. Ask your health care provider how often you should have your eyes checked. Personal lifestyle choices, including: Daily care of your teeth and gums. Regular physical activity. Eating a healthy diet. Avoiding tobacco and drug use. Limiting alcohol use. Practicing safe sex. Taking low-dose aspirin every day. Taking vitamin and mineral supplements as recommended  by your health care provider. What happens during an annual well check? The services and screenings done by your health care provider during your annual well check will depend on your age, overall health, lifestyle risk factors, and family history of disease. Counseling  Your health care provider may ask you questions about your: Alcohol use. Tobacco use. Drug use. Emotional well-being. Home and relationship well-being. Sexual activity. Eating habits. History of falls. Memory and ability to understand (cognition). Work and work Statistician. Reproductive health. Screening  You may have the following tests or measurements: Height, weight, and BMI. Blood pressure. Lipid and cholesterol levels. These may be checked every 5 years, or more frequently if you are over 34 years old. Skin check. Lung cancer screening. You may have this screening every year starting at age 5 if you have a 30-pack-year history of smoking and currently smoke or have quit within the past 15 years. Fecal occult blood test (FOBT) of the stool. You may have this test every year starting at age 55. Flexible sigmoidoscopy or colonoscopy. You may have a sigmoidoscopy every 5 years or a colonoscopy every 10 years starting at age 75. Hepatitis C blood test. Hepatitis B blood test. Sexually transmitted disease (STD) testing. Diabetes screening. This is done by checking your blood sugar (glucose) after you have not eaten for a while (fasting). You may have this done  every 1-3 years. Bone density scan. This is done to screen for osteoporosis. You may have this done starting at age 44. Mammogram. This may be done every 1-2 years. Talk to your health care provider about how often you should have regular mammograms. Talk with your health care provider about your test results, treatment options, and if necessary, the need for more tests. Vaccines  Your health care provider may recommend certain vaccines, such as: Influenza  vaccine. This is recommended every year. Tetanus, diphtheria, and acellular pertussis (Tdap, Td) vaccine. You may need a Td booster every 10 years. Zoster vaccine. You may need this after age 75. Pneumococcal 13-valent conjugate (PCV13) vaccine. One dose is recommended after age 8. Pneumococcal polysaccharide (PPSV23) vaccine. One dose is recommended after age 82. Talk to your health care provider about which screenings and vaccines you need and how often you need them. This information is not intended to replace advice given to you by your health care provider. Make sure you discuss any questions you have with your health care provider. Document Released: 12/05/2015 Document Revised: 07/28/2016 Document Reviewed: 09/09/2015 Elsevier Interactive Patient Education  2017 [place] Prevention in the Home Falls can cause injuries. They can happen to people of all ages. There are many things you can do to make your home safe and to help prevent falls. What can I do on the outside of my home? Regularly fix the edges of walkways and driveways and fix any cracks. Remove anything that might make you trip as you walk through a door, such as a raised step or threshold. Trim any bushes or trees on the path to your home. Use bright outdoor lighting. Clear any walking paths of anything that might make someone trip, such as rocks or tools. Regularly check to see if handrails are loose or broken. Make sure that both sides of any steps have handrails. Any raised decks and porches should have guardrails on the edges. Have any leaves, snow, or ice cleared regularly. Use sand or salt on walking paths during winter. Clean up any spills in your garage right away. This includes oil or grease spills. What can I do in the bathroom? Use night lights. Install grab bars by the toilet and in the tub and shower. Do not use towel bars as grab bars. Use non-skid mats or decals in the tub or shower. If you  need to sit down in the shower, use a plastic, non-slip stool. Keep the floor dry. Clean up any water that spills on the floor as soon as it happens. Remove soap buildup in the tub or shower regularly. Attach bath mats securely with double-sided non-slip rug tape. Do not have throw rugs and other things on the floor that can make you trip. What can I do in the bedroom? Use night lights. Make sure that you have a light by your bed that is easy to reach. Do not use any sheets or blankets that are too big for your bed. They should not hang down onto the floor. Have a firm chair that has side arms. You can use this for support while you get dressed. Do not have throw rugs and other things on the floor that can make you trip. What can I do in the kitchen? Clean up any spills right away. Avoid walking on wet floors. Keep items that you use a lot in easy-to-reach places. If you need to reach something above you, use a strong step stool that has  a grab bar. Keep electrical cords out of the way. Do not use floor polish or wax that makes floors slippery. If you must use wax, use non-skid floor wax. Do not have throw rugs and other things on the floor that can make you trip. What can I do with my stairs? Do not leave any items on the stairs. Make sure that there are handrails on both sides of the stairs and use them. Fix handrails that are broken or loose. Make sure that handrails are as long as the stairways. Check any carpeting to make sure that it is firmly attached to the stairs. Fix any carpet that is loose or worn. Avoid having throw rugs at the top or bottom of the stairs. If you do have throw rugs, attach them to the floor with carpet tape. Make sure that you have a light switch at the top of the stairs and the bottom of the stairs. If you do not have them, ask someone to add them for you. What else can I do to help prevent falls? Wear shoes that: Do not have high heels. Have rubber  bottoms. Are comfortable and fit you well. Are closed at the toe. Do not wear sandals. If you use a stepladder: Make sure that it is fully opened. Do not climb a closed stepladder. Make sure that both sides of the stepladder are locked into place. Ask someone to hold it for you, if possible. Clearly mark and make sure that you can see: Any grab bars or handrails. First and last steps. Where the edge of each step is. Use tools that help you move around (mobility aids) if they are needed. These include: Canes. Walkers. Scooters. Crutches. Turn on the lights when you go into a dark area. Replace any light bulbs as soon as they burn out. Set up your furniture so you have a clear path. Avoid moving your furniture around. If any of your floors are uneven, fix them. If there are any pets around you, be aware of where they are. Review your medicines with your doctor. Some medicines can make you feel dizzy. This can increase your chance of falling. Ask your doctor what other things that you can do to help prevent falls. This information is not intended to replace advice given to you by your health care provider. Make sure you discuss any questions you have with your health care provider. Document Released: 09/04/2009 Document Revised: 04/15/2016 Document Reviewed: 12/13/2014 Elsevier Interactive Patient Education  2017 [employer].

## 2022-10-01 NOTE — Progress Notes (Signed)
Subjective:   Katiana Ruland is a 76 y.o. female who presents for Medicare Annual (Subsequent) preventive examination.  Review of Systems    No ROS.  Medicare Wellness Virtual Visit.  Visual/audio telehealth visit, UTA vital signs.   See social history for additional risk factors.   Cardiac Risk Factors include: advanced age (>56mn, >>49women)     Objective:    Today's Vitals   10/01/22 1447  Weight: 183 lb (83 kg)  Height: '5\' 6"'$  (1.676 m)   Body mass index is 29.54 kg/m.     10/01/2022    2:39 PM 08/17/2022   12:14 PM 09/30/2021    8:32 AM 07/23/2021    7:18 PM 09/29/2020    8:39 AM 01/08/2020   11:17 AM 10/09/2019    7:53 AM  Advanced Directives  Does Patient Have a Medical Advance Directive? Yes Yes Yes No Yes Yes Yes  Type of Advance Directive Living will Living will [place] will  [place] Living will Living will  Does patient want to make changes to medical advance directive? No - Patient declined No - Patient declined No - Patient declined  No - Patient declined No - Patient declined No - Patient declined  Copy of [place] Chart?   Yes - validated most recent copy scanned in chart (See row information)  Yes - validated most recent copy scanned in chart (See row information)  Yes - validated most recent copy scanned in chart (See row information)  Would patient like information on creating a medical advance directive?    Yes (ED - Information included in AVS)       Current Medications (verified) Outpatient Encounter Medications as of 10/01/2022  Medication Sig   amLODipine (NORVASC) 5 MG tablet TAKE 1 TABLET BY MOUTH ONCE DAILY   azelastine (ASTELIN) 0.1 % nasal spray Place 1 spray into both nostrils 2 (two) times daily. Use in each nostril as directed   buPROPion (WELLBUTRIN XL) 300 MG 24 hr tablet TAKE 1 TABLET BY MOUTH EVERY MORNING   calcium carbonate (OSCAL) 1500 (600 Ca) MG TABS  tablet Take 600 mg of elemental calcium by mouth daily with breakfast.   Cholecalciferol (VITAMIN D) 2000 units tablet Take 1 tablet by mouth daily.   cyclobenzaprine (FLEXERIL) 5 MG tablet TAKE 1 TABLET BY MOUTH EVERY NIGHT AT BEDTIME AS NEEDED FOR MUSCLE SPASMS   hydrochlorothiazide (MICROZIDE) 12.5 MG capsule Take 1 capsule (12.5 mg total) by mouth daily.   losartan (COZAAR) 100 MG tablet TAKE 1 TABLET BY MOUTH ONCE DAILY   metoprolol succinate (TOPROL-XL) 50 MG 24 hr tablet TAKE 1 TABLET BY MOUTH ONCE A DAY IMMEDIATELY FOLLOWING A MEAL   Multiple Vitamin (MULTIVITAMIN) tablet Take 1 tablet by mouth daily.   mupirocin ointment (BACTROBAN) 2 % Apply 1 Application topically 2 (two) times daily.   potassium chloride (KLOR-CON) 10 MEQ tablet TAKE 1 TABLET BY MOUTH DAILY   sertraline (ZOLOFT) 100 MG tablet TAKE 1 TABLET BY MOUTH EVERY NIGHT AT BEDTIME   simvastatin (ZOCOR) 20 MG tablet TAKE 1 TABLET BY MOUTH ONCE A DAY   terbinafine (LAMISIL) 250 MG tablet Take 1 tablet (250 mg total) by mouth daily.   No facility-administered encounter medications on file as of 10/01/2022.    Allergies (verified) Fluoxetine   History: Past Medical History:  Diagnosis Date   Cancer ([facility] 2001   Right breast, s/p chemotherapy, XRT and mastectomy, Dr. CJeb Levering  Colon polyps    Hyperlipidemia    Hypertension    Personal history of chemotherapy    Personal history of radiation therapy    Pre-diabetes    Past Surgical History:  Procedure Laterality Date   ABDOMINAL HYSTERECTOMY  1998   cervix intact; for noncancerous reason, fibroids.   APPENDECTOMY     BREAST SURGERY  2002   mastectomy   CATARACT EXTRACTION W/PHACO Right 10/09/2019   Procedure: CATARACT EXTRACTION PHACO AND INTRAOCULAR LENS PLACEMENT (IOC) RIGHT DIABETIC 9.43, 00:53;  Surgeon: Birder Robson, MD;  Location: [facility];  Service: Ophthalmology;  Laterality: Right;   CATARACT EXTRACTION W/PHACO Left 08/17/2022   Procedure:  CATARACT EXTRACTION PHACO AND INTRAOCULAR LENS PLACEMENT (IOC) LEFT 6.76 00:39.8 ;  Surgeon: Birder Robson, MD;  Location: [place];  Service: Ophthalmology;  Laterality: Left;   CHOLECYSTECTOMY     COLONOSCOPY WITH PROPOFOL N/A 01/08/2020   Procedure: COLONOSCOPY WITH PROPOFOL;  Surgeon: Lin Landsman, MD;  Location: [place];  Service: Gastroenterology;  Laterality: N/A;   MASTECTOMY Right 2001   chemo, rad   OOPHORECTOMY     POLYPECTOMY  01/08/2020   Procedure: POLYPECTOMY;  Surgeon: Lin Landsman, MD;  Location: [place];  Service: Gastroenterology;;   TUBAL LIGATION     Family History  Problem Relation Age of Onset   Arthritis Mother    Stroke Mother 9   Arthritis Sister    Stroke Sister 23       paralyzed left side   Breast cancer Sister 66   Cancer Sister        breast   Heart disease Brother    Asthma Brother    Colon cancer Brother    Lupus Daughter    Lung cancer Son    Breast cancer Cousin        maternal - 59's   Breast cancer Other    Social History   Socioeconomic History   Marital status: Married    Spouse name: Not on file   Number of children: Not on file   Years of education: Not on file   Highest education level: Not on file  Occupational History   Not on file  Tobacco Use   Smoking status: Former    Types: Cigarettes    Quit date: 04/10/1997    Years since quitting: 25.4   Smokeless tobacco: Never  Vaping Use   Vaping Use: Never used  Substance and Sexual Activity   Alcohol use: Not Currently    Alcohol/week: 1.0 standard drink of alcohol    Types: 1 Glasses of wine per week   Drug use: No   Sexual activity: Not Currently  Other Topics Concern   Not on file  Social History Narrative   Lives in [place] with husband. No pets      Work - retired, Surveyor, minerals      Diet - regular      Exercise - none   Social Determinants of Radio broadcast assistant Strain: [place]   (10/01/2022)   Overall Financial Resource Strain (CARDIA)    Difficulty of Paying Living Expenses: Not hard at all  Food Insecurity: No Food Insecurity (10/01/2022)   Hunger Vital Sign    Worried About Running Out of Food in the Last Year: Never true    [place] in the Last Year: Never true  Transportation Needs: No Transportation Needs (10/01/2022)   PRAPARE - Transportation  Lack of Transportation (Medical): No    Lack of Transportation (Non-Medical): No  Physical Activity: Insufficiently Active (10/01/2022)   Exercise Vital Sign    Days of Exercise per Week: 2 days    Minutes of Exercise per Session: 20 min  Stress: No Stress Concern Present (10/01/2022)   [place]    Feeling of Stress : Not at all  Social Connections: Unknown (10/01/2022)   Social Connection and Isolation Panel [NHANES]    Frequency of Communication with Friends and Family: More than three times a week    Frequency of Social Gatherings with Friends and Family: More than three times a week    Attends Religious Services: Not on Advertising copywriter or Organizations: Not on file    Attends Archivist Meetings: Not on file    Marital Status: Married    Tobacco Counseling Counseling given: Not Answered   Clinical Intake:  Pre-visit preparation completed: Yes        Diabetes: No  How often do you need to have someone help you when you read instructions, pamphlets, or other written materials from your doctor or pharmacy?: 1 - Never    Interpreter Needed?: No      Activities of Daily Living    10/01/2022    2:44 PM 08/17/2022   12:18 PM  In your present state of health, do you have any difficulty performing the following activities:  Hearing? 0 0  Vision? 0 0  Difficulty concentrating or making decisions? 0 0  Walking or climbing stairs? 0 0  Dressing or bathing? 0 0  Doing errands, shopping? 1    Comment Family Diplomatic Services operational officer and eating ? N   Using the Toilet? N   In the past six months, have you accidently leaked urine? N   Do you have problems with loss of bowel control? N   Managing your Medications? N   Managing your Finances? N   Housekeeping or managing your Housekeeping? N     Patient Care Team: Burnard Hawthorne, FNP as PCP - General (Family Medicine)  Indicate any recent Medical Services you may have received from other than [facility] providers in the past year (date may be approximate).     Assessment:   This is a routine wellness examination for Alexis.  I connected with  Allena Napoleon on 10/01/22 by a audio enabled telemedicine application and verified that I am speaking with the correct person using two identifiers.  Patient Location: Home  Provider Location: Office/Clinic  I discussed the limitations of evaluation and management by telemedicine. The patient expressed understanding and agreed to proceed.   Hearing/Vision screen Hearing Screening - Comments:: Patient is able to hear conversational tones without difficulty. No issues reported. Vision Screening - Comments:: Followed by [facility] Wears corrective lenses Cataract extracted, bilateral They have regular follow up with the ophthalmologist.    Dietary issues and exercise activities discussed: Current Exercise Habits: Home exercise routine, Intensity: Mild Healthy diet Good water intake   Goals Addressed             This Visit's Progress    I would like to continue losing weight       Stay active Healthy diet Stay hydrated       Depression Screen    10/01/2022    2:41 PM 08/30/2022    9:39 AM 06/02/2022    2:34  PM 01/25/2022   10:34 AM 01/06/2022    1:35 PM 10/05/2021   10:42 AM 09/30/2021    8:33 AM  PHQ 2/9 Scores  PHQ - 2 Score 0 0 0 0 0 2 0  PHQ- 9 Score 0 0  0 0 3     Fall Risk    10/01/2022    2:44 PM 08/30/2022    9:39 AM 06/02/2022    2:33 PM  01/25/2022   10:34 AM 09/30/2021    8:33 AM  Fall Risk   Falls in the past year? 0 0 0 0 0  Number falls in past yr: 0 0 0 0 0  Injury with Fall? 0 0 0 0   Risk for fall due to :  No Fall Risks No Fall Risks No Fall Risks   Follow up Falls evaluation completed;Falls prevention discussed Falls evaluation completed Falls evaluation completed Falls evaluation completed Falls evaluation completed    FALL RISK PREVENTION PERTAINING TO THE HOME: Home free of loose throw rugs in walkways, pet beds, electrical cords, etc? Yes  Adequate lighting in your home to reduce risk of falls? Yes   ASSISTIVE DEVICES UTILIZED TO PREVENT FALLS: Life alert? No  Use of a cane, walker or w/c? No  Grab bars in the bathroom? No  Shower chair or bench in shower? Yes  Elevated toilet seat or a handicapped toilet? No   TIMED UP AND GO: Was the test performed? No .   Cognitive Function:    09/15/2017    8:40 AM  MMSE - Mini Mental State Exam  Orientation to time 5  Orientation to Place 5  Registration 3  Attention/ Calculation 5  Recall 3  Language- name 2 objects 2  Language- repeat 1  Language- follow 3 step command 3  Language- read & follow direction 1  Write a sentence 1  Copy design 1  Total score 30        10/01/2022    2:46 PM 09/30/2021    8:40 AM 09/29/2020    8:58 AM 09/28/2019    8:54 AM 09/25/2018    8:24 AM  6CIT Screen  What Year? 0 points 0 points 0 points 0 points 0 points  What month? 0 points 0 points 0 points 0 points 0 points  What time? 0 points 0 points 0 points 0 points 0 points  Count back from 20 0 points 0 points  0 points 0 points  Months in reverse 0 points 0 points 0 points 0 points 0 points  Repeat phrase 0 points 0 points 0 points 0 points 0 points  Total Score 0 points 0 points  0 points 0 points    Immunizations Immunization History  Administered Date(s) Administered   Fluad Quad(high Dose 65+) 08/01/2019, 10/01/2020, 10/26/2021, 08/30/2022   Influenza,  High Dose Seasonal PF 09/15/2016, 09/15/2017, 09/25/2018   Influenza,inj,Quad PF,6+ Mos 10/15/2013, 09/02/2014, 09/16/2015   Influenza-Unspecified 09/10/2012   Moderna Sars-Covid-2 Vaccination 01/19/2020, 02/16/2020   Pneumococcal Conjugate-13 04/19/2014   Pneumococcal Polysaccharide-23 04/10/2013   Tdap 09/15/2016   Zoster, Live 04/11/2011   Screening Tests Health Maintenance  Topic Date Due   COVID-19 Vaccine (3 - Moderna risk series) 02/08/2023 (Originally 03/15/2020)   Zoster Vaccines- Shingrix (1 of 2) 05/12/2023 (Originally 01/03/1965)   Medicare Annual Wellness (AWV)  10/02/2023   COLONOSCOPY (Pts 45-70yr Insurance coverage will need to be confirmed)  01/07/2025   TETANUS/TDAP  09/15/2026   Pneumonia Vaccine 77 Years old  Completed  INFLUENZA VACCINE  Completed   DEXA SCAN  Completed   Hepatitis C Screening  Completed   HPV VACCINES  Aged Out    Health Maintenance  There are no preventive care reminders to display for this patient.  Lung Cancer Screening: (Low Dose CT Chest recommended if Age 35-80 years, 30 pack-year currently smoking OR have quit w/in 15years.) does not qualify.   Hepatitis C Screening: Completed 2017.  Vision Screening: Recommended annual ophthalmology exams for early detection of glaucoma and other disorders of the eye.  Dental Screening: Recommended annual dental exams for proper oral hygiene  Community Resource Referral / Chronic Care Management: CRR required this visit?  No   CCM required this visit?  No      Plan:     I have personally reviewed and noted the following in the patient's chart:   Medical and social history Use of alcohol, tobacco or illicit drugs  Current medications and supplements including opioid prescriptions. Patient is not currently taking opioid prescriptions. Functional ability and status Nutritional status Physical activity Advanced directives List of other physicians Hospitalizations, surgeries, and ER  visits in previous 12 months Vitals Screenings to include cognitive, depression, and falls Referrals and appointments  In addition, I have reviewed and discussed with patient certain preventive protocols, quality metrics, and best practice recommendations. A written personalized care plan for preventive services as well as general preventive health recommendations were provided to patient.     Leta Jungling, LPN   [id number]

## 2022-11-01 ENCOUNTER — Telehealth: Payer: Self-pay | Admitting: Family

## 2022-11-16 ENCOUNTER — Other Ambulatory Visit: Payer: Self-pay

## 2022-11-16 MED ORDER — POTASSIUM CHLORIDE ER 10 MEQ PO TBCR: 10.0000 meq | EXTENDED_RELEASE_TABLET | Freq: Every day | ORAL | 0 refills | Status: DC

## 2022-11-16 NOTE — Telephone Encounter (Signed)
sent 

## 2022-11-16 NOTE — Telephone Encounter (Signed)
Patient is calling about her potassium chloride (KLOR-CON) 10 MEQ tablet. She states she has been waiting for this to be refilled.

## 2022-12-03 ENCOUNTER — Ambulatory Visit: Payer: Medicare Other | Admitting: Family

## 2022-12-08 ENCOUNTER — Encounter: Payer: Self-pay | Admitting: Family

## 2022-12-08 ENCOUNTER — Ambulatory Visit (INDEPENDENT_AMBULATORY_CARE_PROVIDER_SITE_OTHER): Payer: 59 | Admitting: Family

## 2022-12-08 VITALS — BP 122/78 | HR 72 | Temp 97.7°F | Ht 66.0 in | Wt 180.4 lb

## 2022-12-08 DIAGNOSIS — F339 Major depressive disorder, recurrent, unspecified: Secondary | ICD-10-CM | POA: Diagnosis not present

## 2022-12-08 DIAGNOSIS — M545 Low back pain, unspecified: Secondary | ICD-10-CM | POA: Diagnosis not present

## 2022-12-08 DIAGNOSIS — I10 Essential (primary) hypertension: Secondary | ICD-10-CM

## 2022-12-08 DIAGNOSIS — R7309 Other abnormal glucose: Secondary | ICD-10-CM

## 2022-12-08 DIAGNOSIS — G8929 Other chronic pain: Secondary | ICD-10-CM

## 2022-12-08 DIAGNOSIS — M8580 Other specified disorders of bone density and structure, unspecified site: Secondary | ICD-10-CM

## 2022-12-08 NOTE — Progress Notes (Signed)
Assessment & Plan:  Elevated glucose  Essential hypertension, benign  Osteopenia, unspecified location -     DG Bone Density; Future  Hypertension, unspecified type Assessment & Plan: Chronic, stable.  Continue amlodipine 5 mg, hydrochlorothiazide 12.'5mg'$ , losartan '100mg'$ , toprol '50mg'$  qd, potassium chloride 10 mEq daily.   Depression, recurrent ([name]) Assessment & Plan: Chronic, stable.continue  Wellbutrin 300 mg, zoloft '100mg'$    Chronic bilateral low back pain, unspecified whether sciatica present Assessment & Plan: Reassuring exam today.  Back pain has completely resolved.  Advised patient she may use Tylenol arthritis or Salonpas pain patch.  She  declines baseline lumbar x-ray, hip x-ray today.      Return precautions given.   Risks, benefits, and alternatives of the medications and treatment plan prescribed today were discussed, and patient expressed understanding.   Education regarding symptom management and diagnosis given to patient on AVS either electronically or printed.  Return in about 4 months (around 04/08/2023).  Michelle Paris, FNP  Subjective:    Patient ID: Michelle Clark, female    DOB: 08/16/1946, 77 y.o.   MRN: [id number]  CC: Michelle Clark is a 77 y.o. female who presents today for follow up.   HPI: Feels well today.  She does note that 3 to 4 days ago she experienced bilateral low back pain.  It was worse long periods of sitting or standing.  Pain is completely resolved at this time.  Denies saddle anesthesia, numbness or weakness in her legs, trouble urinating or having a bowel movement.   HTN- compliant with hctz 12.'5mg'$ , amlodipine '5mg'$ , losartan '100mg'$ , toprol '50mg'$ , potassium chloride 10 mEq   Depression- compliant with  Zoloft 100 mg, Wellbutrin 300 mg  .  She feels regimen is working well for her. Allergies: Fluoxetine Current Outpatient Medications on File Prior to Visit  Medication Sig Dispense Refill   amLODipine (NORVASC) 5 MG tablet  TAKE 1 TABLET BY MOUTH ONCE DAILY 90 tablet 3   buPROPion (WELLBUTRIN XL) 300 MG 24 hr tablet TAKE 1 TABLET BY MOUTH EVERY MORNING 90 tablet 1   calcium carbonate (OSCAL) 1500 (600 Ca) MG TABS tablet Take 600 mg of elemental calcium by mouth daily with breakfast.     Cholecalciferol (VITAMIN D) 2000 units tablet Take 1 tablet by mouth daily.     cyclobenzaprine (FLEXERIL) 5 MG tablet TAKE 1 TABLET BY MOUTH EVERY NIGHT AT BEDTIME AS NEEDED FOR MUSCLE SPASMS 30 tablet 0   hydrochlorothiazide (MICROZIDE) 12.5 MG capsule Take 1 capsule (12.5 mg total) by mouth daily. 90 capsule 2   losartan (COZAAR) 100 MG tablet TAKE 1 TABLET BY MOUTH ONCE DAILY 90 tablet 3   metoprolol succinate (TOPROL-XL) 50 MG 24 hr tablet TAKE 1 TABLET BY MOUTH ONCE A DAY IMMEDIATELY FOLLOWING A MEAL 90 tablet 3   Multiple Vitamin (MULTIVITAMIN) tablet Take 1 tablet by mouth daily.     mupirocin ointment (BACTROBAN) 2 % Apply 1 Application topically 2 (two) times daily. 22 g 2   potassium chloride (KLOR-CON) 10 MEQ tablet Take 1 tablet (10 mEq total) by mouth daily. 90 tablet 0   sertraline (ZOLOFT) 100 MG tablet TAKE 1 TABLET BY MOUTH EVERY NIGHT AT BEDTIME 90 tablet 1   simvastatin (ZOCOR) 20 MG tablet TAKE 1 TABLET BY MOUTH ONCE A DAY 90 tablet 3   No current facility-administered medications on file prior to visit.    Review of Systems  Constitutional:  Negative for chills and fever.  Respiratory:  Negative for  cough.   Cardiovascular:  Negative for chest pain and palpitations.  Gastrointestinal:  Negative for nausea and vomiting.  Musculoskeletal:  Negative for arthralgias (resolved).  Neurological:  Negative for numbness.      Objective:    BP 122/78   Pulse 72   Temp 97.7 F (36.5 C)   Ht '5\' 6"'$  (1.676 m)   Wt 180 lb 6.4 oz (81.8 kg)   SpO2 99%   BMI 29.12 kg/m  BP Readings from Last 3 Encounters:  12/08/22 122/78  08/30/22 130/80  08/17/22 125/72   Wt Readings from Last 3 Encounters:  12/08/22 180  lb 6.4 oz (81.8 kg)  10/01/22 183 lb (83 kg)  08/30/22 183 lb 3.2 oz (83.1 kg)    Physical Exam Vitals reviewed.  Constitutional:      Appearance: She is well-developed.  Eyes:     Conjunctiva/sclera: Conjunctivae normal.  Cardiovascular:     Rate and Rhythm: Normal rate and regular rhythm.     Pulses: Normal pulses.     Heart sounds: Normal heart sounds.  Pulmonary:     Effort: Pulmonary effort is normal.     Breath sounds: Normal breath sounds. No wheezing, rhonchi or rales.  Musculoskeletal:     Lumbar back: No swelling, edema, spasms, tenderness or bony tenderness. Normal range of motion.     Comments: Full range of motion with flexion, tension, lateral side bends. No bony tenderness. No pain, numbness, tingling elicited with single leg raise bilaterally.   Skin:    General: Skin is warm and dry.  Neurological:     Mental Status: She is alert.     Sensory: No sensory deficit.     Deep Tendon Reflexes:     Reflex Scores:      Patellar reflexes are 2+ on the right side and 2+ on the left side.    Comments: Sensation and strength intact bilateral lower extremities.  Psychiatric:        Speech: Speech normal.        Behavior: Behavior normal.        Thought Content: Thought content normal.

## 2022-12-08 NOTE — Assessment & Plan Note (Signed)
Chronic, stable.  Continue amlodipine 5 mg, hydrochlorothiazide 12.'5mg'$ , losartan '100mg'$ , toprol '50mg'$  qd, potassium chloride 10 mEq daily.

## 2022-12-08 NOTE — Assessment & Plan Note (Signed)
Reassuring exam today.  Back pain has completely resolved.  Advised patient she may use Tylenol arthritis or Salonpas pain patch.  She  declines baseline lumbar x-ray, hip x-ray today.

## 2022-12-08 NOTE — Assessment & Plan Note (Signed)
Chronic, stable.continue  Wellbutrin 300 mg, zoloft '100mg'$ 

## 2022-12-08 NOTE — Patient Instructions (Addendum)
Trial salon pas pain patch over the counter for low back pain.  You may also use  Tylenol Arthritis which is a '650mg'$  tablet .   Please let me know if back pain becomes more persistent or worsens.  Please call  and schedule your 3D mammogram and /or bone density scan as we discussed.   [facility]  ( new location in 2023)  [address], [place], [place] [address]  [place], [place]

## 2022-12-10 ENCOUNTER — Telehealth: Payer: Self-pay | Admitting: Family

## 2022-12-10 NOTE — Telephone Encounter (Signed)
Pt called stating she need a new done density order done because the other one expired. Pt would like to be called

## 2022-12-13 NOTE — Telephone Encounter (Signed)
Ordered and pt is aware.  

## 2023-01-27 ENCOUNTER — Other Ambulatory Visit: Payer: Self-pay | Admitting: Family

## 2023-01-27 DIAGNOSIS — F339 Major depressive disorder, recurrent, unspecified: Secondary | ICD-10-CM

## 2023-02-15 ENCOUNTER — Other Ambulatory Visit: Payer: Self-pay | Admitting: Family

## 2023-02-15 NOTE — Telephone Encounter (Signed)
Refilled: 11/16/2022 Last oV: 12/08/2022 Next OV: 04/08/2023

## 2023-03-07 ENCOUNTER — Other Ambulatory Visit: Payer: Self-pay | Admitting: Family

## 2023-03-07 DIAGNOSIS — I10 Essential (primary) hypertension: Secondary | ICD-10-CM

## 2023-03-08 ENCOUNTER — Other Ambulatory Visit: Payer: Self-pay | Admitting: Family

## 2023-03-08 DIAGNOSIS — M8580 Other specified disorders of bone density and structure, unspecified site: Secondary | ICD-10-CM

## 2023-03-21 ENCOUNTER — Encounter: Payer: Self-pay | Admitting: Family

## 2023-03-21 DIAGNOSIS — H43813 Vitreous degeneration, bilateral: Secondary | ICD-10-CM | POA: Diagnosis not present

## 2023-03-21 DIAGNOSIS — Z961 Presence of intraocular lens: Secondary | ICD-10-CM | POA: Diagnosis not present

## 2023-03-25 ENCOUNTER — Other Ambulatory Visit: Payer: Self-pay | Admitting: Family Medicine

## 2023-03-29 ENCOUNTER — Ambulatory Visit
Admission: RE | Admit: 2023-03-29 | Discharge: 2023-03-29 | Disposition: A | Discharge status: Home / Self Care | Payer: 59 | Source: Ambulatory Visit | Attending: Family | Admitting: Family

## 2023-03-29 DIAGNOSIS — Z0000 Encounter for general adult medical examination without abnormal findings: Secondary | ICD-10-CM

## 2023-03-29 DIAGNOSIS — Z9011 Acquired absence of right breast and nipple: Secondary | ICD-10-CM | POA: Insufficient documentation

## 2023-03-29 DIAGNOSIS — Z1231 Encounter for screening mammogram for malignant neoplasm of breast: Secondary | ICD-10-CM | POA: Insufficient documentation

## 2023-04-07 ENCOUNTER — Ambulatory Visit: Payer: 59 | Admitting: Family

## 2023-04-08 ENCOUNTER — Ambulatory Visit: Payer: 59 | Admitting: Family

## 2023-04-12 ENCOUNTER — Encounter: Payer: Self-pay | Admitting: Family

## 2023-04-12 ENCOUNTER — Ambulatory Visit (INDEPENDENT_AMBULATORY_CARE_PROVIDER_SITE_OTHER): Payer: 59 | Admitting: Family

## 2023-04-12 VITALS — BP 128/82 | HR 86 | Temp 97.4°F | Ht 66.0 in | Wt 177.0 lb

## 2023-04-12 DIAGNOSIS — F339 Major depressive disorder, recurrent, unspecified: Secondary | ICD-10-CM | POA: Diagnosis not present

## 2023-04-12 DIAGNOSIS — I10 Essential (primary) hypertension: Secondary | ICD-10-CM

## 2023-04-12 LAB — COMPREHENSIVE METABOLIC PANEL
ALT: 19 U/L (ref 0–35)
AST: 20 U/L (ref 0–37)
Albumin: 4.5 g/dL (ref 3.5–5.2)
Alkaline Phosphatase: 73 U/L (ref 39–117)
BUN: 28 mg/dL — ABNORMAL HIGH (ref 6–23)
CO2: 25 mEq/L (ref 19–32)
Calcium: 9.8 mg/dL (ref 8.4–10.5)
Chloride: 101 mEq/L (ref 96–112)
Creatinine, Ser: 1.18 mg/dL (ref 0.40–1.20)
GFR: 44.31 mL/min — ABNORMAL LOW (ref >60.00)
Glucose, Bld: 79 mg/dL (ref 70–99)
Potassium: 3.8 mEq/L (ref 3.5–5.1)
Sodium: 135 mEq/L (ref 135–145)
Total Bilirubin: 0.6 mg/dL (ref 0.2–1.2)
Total Protein: 7.9 g/dL (ref 6.0–8.3)

## 2023-04-12 NOTE — Assessment & Plan Note (Signed)
Chronic, stable.continue  Wellbutrin 300 mg, zoloft 100mg 

## 2023-04-12 NOTE — Patient Instructions (Signed)
Consider voltaren gel  You may also start scheduling Tylenol Arthritis which is a 650mg  tablet . You may take once in the morning and once in the afternoon.   Maximum daily dose of acetaminophen 4 g per day from all sources.  If you are taking another medication which includes acetaminophen (Tylenol) which may be in cough and cold preparations or pain medication such as Percocet, you will need to factor that into your total daily dose to be safe.  Please let me know if any questions  A great article regarding how to safely take and dose tylenol found below.  Title : 'Acetaminophen safety: Be cautious but not afraid'  https://www.health.https://gentry.org/

## 2023-04-12 NOTE — Progress Notes (Signed)
Assessment & Plan:  Essential hypertension, benign -     Comprehensive metabolic panel  Hypertension, unspecified type Assessment & Plan: Chronic, stable.  Continue amlodipine 5 mg, hydrochlorothiazide 12.5mg , losartan 100mg , toprol 50mg  qd, potassium chloride 10 mEq daily.   Depression, recurrent ([facility]) Assessment & Plan: Chronic, stable.continue  Wellbutrin 300 mg, zoloft 100mg       Return precautions given.   Risks, benefits, and alternatives of the medications and treatment plan prescribed today were discussed, and patient expressed understanding.   Education regarding symptom management and diagnosis given to patient on AVS either electronically or printed.  Return in about 6 months (around 10/13/2023).  Rennie Plowman, FNP  Subjective:    Patient ID: Michelle Clark, female    DOB: July 15, 1946, 77 y.o.   MRN: [id number]  CC: Michelle Clark is a 77 y.o. female who presents today for follow up.   HPI: Feels well today.  No new complaints.  She is very pleased with medication regimen.  Denies chest pain, shortness of breath. She recently went on a [place] cruise and had a great time.   Allergies: Fluoxetine Current Outpatient Medications on File Prior to Visit  Medication Sig Dispense Refill   amLODipine (NORVASC) 5 MG tablet TAKE 1 TABLET BY MOUTH ONCE DAILY 90 tablet 3   buPROPion (WELLBUTRIN XL) 300 MG 24 hr tablet Take 1 tablet (300 mg total) by mouth every morning. 90 tablet 3   calcium carbonate (OSCAL) 1500 (600 Ca) MG TABS tablet Take 600 mg of elemental calcium by mouth daily with breakfast.     Cholecalciferol (VITAMIN D) 2000 units tablet Take 1 tablet by mouth daily.     cyclobenzaprine (FLEXERIL) 5 MG tablet TAKE 1 TABLET BY MOUTH EVERY NIGHT AT BEDTIME AS NEEDED FOR MUSCLE SPASMS 30 tablet 0   hydrochlorothiazide (MICROZIDE) 12.5 MG capsule TAKE ONE CAPSULE BY MOUTH ONCE DAILY 90 capsule 2   losartan (COZAAR) 100 MG tablet TAKE ONE TABLET BY MOUTH  ONCE DAILY 90 tablet 3   metoprolol succinate (TOPROL-XL) 50 MG 24 hr tablet TAKE 1 TABLET BY MOUTH ONCE A DAY IMMEDIATELY FOLLOWING A MEAL 90 tablet 3   Multiple Vitamin (MULTIVITAMIN) tablet Take 1 tablet by mouth daily.     mupirocin ointment (BACTROBAN) 2 % Apply 1 Application topically 2 (two) times daily. 22 g 2   potassium chloride (KLOR-CON) 10 MEQ tablet TAKE ONE TABLET BY MOUTH DAILY 90 tablet 3   sertraline (ZOLOFT) 100 MG tablet TAKE 1 TABLET BY MOUTH EVERY NIGHT AT BEDTIME 90 tablet 1   simvastatin (ZOCOR) 20 MG tablet TAKE 1 TABLET BY MOUTH ONCE A DAY 90 tablet 3   No current facility-administered medications on file prior to visit.    Review of Systems  Constitutional:  Negative for chills and fever.  Respiratory:  Negative for cough.   Cardiovascular:  Negative for chest pain and palpitations.  Gastrointestinal:  Negative for nausea and vomiting.      Objective:    BP 128/82   Pulse 86   Temp (!) 97.4 F (36.3 C) (Oral)   Ht 5\' 6"  (1.676 m)   Wt 177 lb (80.3 kg)   SpO2 96%   BMI 28.57 kg/m  BP Readings from Last 3 Encounters:  04/12/23 128/82  12/08/22 122/78  08/30/22 130/80   Wt Readings from Last 3 Encounters:  04/12/23 177 lb (80.3 kg)  12/08/22 180 lb 6.4 oz (81.8 kg)  10/01/22 183 lb (83 kg)  Physical Exam Vitals reviewed.  Constitutional:      Appearance: She is well-developed.  Eyes:     Conjunctiva/sclera: Conjunctivae normal.  Cardiovascular:     Rate and Rhythm: Normal rate and regular rhythm.     Pulses: Normal pulses.     Heart sounds: Normal heart sounds.  Pulmonary:     Effort: Pulmonary effort is normal.     Breath sounds: Normal breath sounds. No wheezing, rhonchi or rales.  Skin:    General: Skin is warm and dry.  Neurological:     Mental Status: She is alert.  Psychiatric:        Speech: Speech normal.        Behavior: Behavior normal.        Thought Content: Thought content normal.

## 2023-04-12 NOTE — Assessment & Plan Note (Signed)
Chronic, stable.  Continue amlodipine 5 mg, hydrochlorothiazide 12.5mg, losartan 100mg, toprol 50mg qd, potassium chloride 10 mEq daily. 

## 2023-04-15 ENCOUNTER — Other Ambulatory Visit: Payer: Self-pay | Admitting: Family

## 2023-04-15 DIAGNOSIS — N289 Disorder of kidney and ureter, unspecified: Secondary | ICD-10-CM

## 2023-05-06 ENCOUNTER — Other Ambulatory Visit: Payer: 59

## 2023-05-09 ENCOUNTER — Other Ambulatory Visit (INDEPENDENT_AMBULATORY_CARE_PROVIDER_SITE_OTHER): Payer: 59

## 2023-05-09 DIAGNOSIS — N289 Disorder of kidney and ureter, unspecified: Secondary | ICD-10-CM | POA: Diagnosis not present

## 2023-05-09 LAB — BASIC METABOLIC PANEL
BUN: 18 mg/dL (ref 6–23)
CO2: 23 mEq/L (ref 19–32)
Calcium: 9.2 mg/dL (ref 8.4–10.5)
Chloride: 106 mEq/L (ref 96–112)
Creatinine, Ser: 1.34 mg/dL — ABNORMAL HIGH (ref 0.40–1.20)
GFR: 38.02 mL/min — ABNORMAL LOW (ref >60.00)
Glucose, Bld: 96 mg/dL (ref 70–99)
Potassium: 3.7 mEq/L (ref 3.5–5.1)
Sodium: 136 mEq/L (ref 135–145)

## 2023-05-10 ENCOUNTER — Other Ambulatory Visit: Payer: Self-pay | Admitting: Family

## 2023-05-10 DIAGNOSIS — N189 Chronic kidney disease, unspecified: Secondary | ICD-10-CM

## 2023-05-12 ENCOUNTER — Ambulatory Visit
Admission: RE | Admit: 2023-05-12 | Discharge: 2023-05-12 | Disposition: A | Discharge status: Home / Self Care | Payer: 59 | Source: Ambulatory Visit | Attending: Family | Admitting: Family

## 2023-05-12 DIAGNOSIS — Z780 Asymptomatic menopausal state: Secondary | ICD-10-CM | POA: Diagnosis not present

## 2023-05-12 DIAGNOSIS — M85852 Other specified disorders of bone density and structure, left thigh: Secondary | ICD-10-CM | POA: Diagnosis not present

## 2023-05-12 DIAGNOSIS — M8589 Other specified disorders of bone density and structure, multiple sites: Secondary | ICD-10-CM | POA: Insufficient documentation

## 2023-05-12 DIAGNOSIS — M8580 Other specified disorders of bone density and structure, unspecified site: Secondary | ICD-10-CM | POA: Insufficient documentation

## 2023-06-20 ENCOUNTER — Other Ambulatory Visit: Payer: Self-pay | Admitting: Family

## 2023-06-20 DIAGNOSIS — I10 Essential (primary) hypertension: Secondary | ICD-10-CM

## 2023-06-20 DIAGNOSIS — E782 Mixed hyperlipidemia: Secondary | ICD-10-CM

## 2023-06-27 ENCOUNTER — Telehealth: Payer: Self-pay | Admitting: Family

## 2023-06-27 NOTE — Telephone Encounter (Signed)
Prescription Request  06/27/2023  LOV: 04/12/2023  What is the name of the medication or equipment?  amLODipine (NORVASC) 5 MG tablet and simvastatin (ZOCOR) 20 MG tablet    Have you contacted your pharmacy to request a refill? Yes    Which pharmacy would you like this sent to?  [facility] - [place], [place] - [address] [place] [address] Phone: [phone] Fax: [phone]    Patient notified that their request is being sent to the clinical staff for review and that they should receive a response within 2 business days.   Please advise at [facility] [phone]

## 2023-07-06 ENCOUNTER — Other Ambulatory Visit: Payer: Self-pay

## 2023-07-06 DIAGNOSIS — I10 Essential (primary) hypertension: Secondary | ICD-10-CM

## 2023-07-06 DIAGNOSIS — E782 Mixed hyperlipidemia: Secondary | ICD-10-CM

## 2023-07-06 MED ORDER — AMLODIPINE BESYLATE 5 MG PO TABS: 5.0000 mg | ORAL_TABLET | Freq: Every day | ORAL | 3 refills | Status: DC

## 2023-07-06 MED ORDER — SIMVASTATIN 20 MG PO TABS: 20.0000 mg | ORAL_TABLET | Freq: Every day | ORAL | 3 refills | Status: DC

## 2023-07-06 NOTE — Telephone Encounter (Signed)
Pt called in to check status of previous refills message.

## 2023-07-06 NOTE — Telephone Encounter (Signed)
Spoke to pt rx's sent in to [facility] pharmacy as requested

## 2023-07-26 ENCOUNTER — Other Ambulatory Visit: Payer: Self-pay | Admitting: Family

## 2023-07-26 DIAGNOSIS — F339 Major depressive disorder, recurrent, unspecified: Secondary | ICD-10-CM

## 2023-08-04 ENCOUNTER — Other Ambulatory Visit: Payer: Self-pay | Admitting: Family

## 2023-08-04 DIAGNOSIS — I10 Essential (primary) hypertension: Secondary | ICD-10-CM

## 2023-08-24 DIAGNOSIS — I129 Hypertensive chronic kidney disease with stage 1 through stage 4 chronic kidney disease, or unspecified chronic kidney disease: Secondary | ICD-10-CM | POA: Diagnosis not present

## 2023-08-24 DIAGNOSIS — N189 Chronic kidney disease, unspecified: Secondary | ICD-10-CM | POA: Diagnosis not present

## 2023-08-24 DIAGNOSIS — R809 Proteinuria, unspecified: Secondary | ICD-10-CM | POA: Diagnosis not present

## 2023-08-24 DIAGNOSIS — R8290 Unspecified abnormal findings in urine: Secondary | ICD-10-CM | POA: Diagnosis not present

## 2023-08-24 DIAGNOSIS — N1832 Chronic kidney disease, stage 3b: Secondary | ICD-10-CM | POA: Diagnosis not present

## 2023-08-26 ENCOUNTER — Other Ambulatory Visit: Payer: Self-pay | Admitting: Nephrology

## 2023-08-26 DIAGNOSIS — N1832 Chronic kidney disease, stage 3b: Secondary | ICD-10-CM

## 2023-08-26 DIAGNOSIS — R8290 Unspecified abnormal findings in urine: Secondary | ICD-10-CM

## 2023-09-02 ENCOUNTER — Telehealth: Payer: Self-pay

## 2023-09-02 ENCOUNTER — Other Ambulatory Visit: Payer: Self-pay | Admitting: Family

## 2023-09-02 DIAGNOSIS — I10 Essential (primary) hypertension: Secondary | ICD-10-CM

## 2023-09-02 MED ORDER — METOPROLOL SUCCINATE ER 50 MG PO TB24: ORAL_TABLET | ORAL | 3 refills | Status: DC

## 2023-09-02 NOTE — Telephone Encounter (Signed)
Refill sent.

## 2023-09-02 NOTE — Telephone Encounter (Signed)
Prescription Request  09/02/2023  LOV: Visit date not found  What is the name of the medication or equipment? metoprolol succinate (TOPROL-XL) 50 MG 24 hr tablet  Have you contacted your pharmacy to request a refill? Yes   Which pharmacy would you like this sent to?  [facility] - [place], [place] - [address] [place] [address] Phone: [phone] Fax: [phone]    Patient notified that their request is being sent to the clinical staff for review and that they should receive a response within 2 business days.   Please advise at [facility] [phone] (mobile)  Patient states she is out of this medication.  Patient states her pharmacy has reached out to [place] but have not heard back from [place].  Patient states her pharmacy gave her a 30-day supply since she was out, but they would like for [place] to go ahead and send in her refill.

## 2023-09-06 ENCOUNTER — Encounter: Payer: 59 | Admitting: Family

## 2023-09-19 ENCOUNTER — Ambulatory Visit
Admission: RE | Admit: 2023-09-19 | Discharge: 2023-09-19 | Disposition: A | Discharge status: Home / Self Care | Payer: 59 | Source: Ambulatory Visit | Attending: Nephrology | Admitting: Nephrology

## 2023-09-19 DIAGNOSIS — R8290 Unspecified abnormal findings in urine: Secondary | ICD-10-CM | POA: Insufficient documentation

## 2023-09-19 DIAGNOSIS — N1832 Chronic kidney disease, stage 3b: Secondary | ICD-10-CM | POA: Insufficient documentation

## 2023-09-21 ENCOUNTER — Encounter: Payer: 59 | Admitting: Family

## 2023-09-21 DIAGNOSIS — N189 Chronic kidney disease, unspecified: Secondary | ICD-10-CM | POA: Diagnosis not present

## 2023-09-21 DIAGNOSIS — N1831 Chronic kidney disease, stage 3a: Secondary | ICD-10-CM | POA: Diagnosis not present

## 2023-09-21 DIAGNOSIS — I129 Hypertensive chronic kidney disease with stage 1 through stage 4 chronic kidney disease, or unspecified chronic kidney disease: Secondary | ICD-10-CM | POA: Diagnosis not present

## 2023-09-21 DIAGNOSIS — R809 Proteinuria, unspecified: Secondary | ICD-10-CM | POA: Diagnosis not present

## 2023-09-27 ENCOUNTER — Telehealth: Payer: Self-pay | Admitting: Family

## 2023-09-27 NOTE — Telephone Encounter (Signed)
Copied from CRM [id number]. Topic: Medicare AWV >> Sep 27, 2023 10:24 AM Payton Doughty wrote: Reason for CRM: Called LVM 09/27/2023 to schedule Annual Wellness Visit  Verlee Rossetti; Care Guide Ambulatory Clinical Support [place] [facility] Direct Dial: [phone]

## 2023-10-03 ENCOUNTER — Ambulatory Visit: Payer: 59 | Admitting: Family

## 2023-10-03 ENCOUNTER — Encounter: Payer: Self-pay | Admitting: Family

## 2023-10-03 ENCOUNTER — Other Ambulatory Visit: Payer: Self-pay | Admitting: Family

## 2023-10-03 VITALS — BP 136/86 | HR 67 | Temp 97.8°F | Ht 66.0 in | Wt 176.6 lb

## 2023-10-03 DIAGNOSIS — Z23 Encounter for immunization: Secondary | ICD-10-CM | POA: Diagnosis not present

## 2023-10-03 DIAGNOSIS — N644 Mastodynia: Secondary | ICD-10-CM

## 2023-10-03 DIAGNOSIS — Z853 Personal history of malignant neoplasm of breast: Secondary | ICD-10-CM

## 2023-10-03 DIAGNOSIS — I10 Essential (primary) hypertension: Secondary | ICD-10-CM | POA: Diagnosis not present

## 2023-10-03 DIAGNOSIS — Z0000 Encounter for general adult medical examination without abnormal findings: Secondary | ICD-10-CM | POA: Diagnosis not present

## 2023-10-03 NOTE — Patient Instructions (Signed)
I have ordered left breast ultrasound and mammogram to evaluate for tenderness on exam today.  Please call  and schedule your 3D mammogram  [facility]  ( new location in 2023)  [address], [place], [place] [address]  [place], [place]  [phone]   Health Maintenance for Postmenopausal Women Menopause is a normal process in which your ability to get pregnant comes to an end. This process happens slowly over many months or years, usually between the ages of 43 and 40. Menopause is complete when you have missed your menstrual period for 12 months. It is important to talk with your health care provider about some of the most common conditions that affect women after menopause (postmenopausal women). These include heart disease, cancer, and bone loss (osteoporosis). Adopting a healthy lifestyle and getting preventive care can help to promote your health and wellness. The actions you take can also lower your chances of developing some of these common conditions. What are the signs and symptoms of menopause? During menopause, you may have the following symptoms: Hot flashes. These can be moderate or severe. Night sweats. Decrease in sex drive. Mood swings. Headaches. Tiredness (fatigue). Irritability. Memory problems. Problems falling asleep or staying asleep. Talk with your health care provider about treatment options for your symptoms. Do I need hormone replacement therapy? Hormone replacement therapy is effective in treating symptoms that are caused by menopause, such as hot flashes and night sweats. Hormone replacement carries certain risks, especially as you become older. If you are thinking about using estrogen or estrogen with progestin, discuss the benefits and risks with your health care provider. How can I reduce my risk for heart disease and stroke? The risk of heart disease, heart attack, and stroke increases as you age. One of the causes may be a change  in the body's hormones during menopause. This can affect how your body uses dietary fats, triglycerides, and cholesterol. Heart attack and stroke are medical emergencies. There are many things that you can do to help prevent heart disease and stroke. Watch your blood pressure High blood pressure causes heart disease and increases the risk of stroke. This is more likely to develop in people who have high blood pressure readings or are overweight. Have your blood pressure checked: Every 3-5 years if you are 19-61 years of age. Every year if you are 31 years old or older. Eat a healthy diet  Eat a diet that includes plenty of vegetables, fruits, low-fat dairy products, and lean protein. Do not eat a lot of foods that are high in solid fats, added sugars, or sodium. Get regular exercise Get regular exercise. This is one of the most important things you can do for your health. Most adults should: Try to exercise for at least 150 minutes each week. The exercise should increase your heart rate and make you sweat (moderate-intensity exercise). Try to do strengthening exercises at least twice each week. Do these in addition to the moderate-intensity exercise. Spend less time sitting. Even light physical activity can be beneficial. Other tips Work with your health care provider to achieve or maintain a healthy weight. Do not use any products that contain nicotine or tobacco. These products include cigarettes, chewing tobacco, and vaping devices, such as e-cigarettes. If you need help quitting, ask your health care provider. Know your numbers. Ask your health care provider to check your cholesterol and your blood sugar (glucose). Continue to have your blood tested as directed by your health care provider.  Do I need screening for cancer? Depending on your health history and family history, you may need to have cancer screenings at different stages of your life. This may include screening for: Breast  cancer. Cervical cancer. Lung cancer. Colorectal cancer. What is my risk for osteoporosis? After menopause, you may be at increased risk for osteoporosis. Osteoporosis is a condition in which bone destruction happens more quickly than new bone creation. To help prevent osteoporosis or the bone fractures that can happen because of osteoporosis, you may take the following actions: If you are 31-38 years old, get at least 1,000 mg of calcium and at least 600 international units (IU) of vitamin D per day. If you are older than age 80 but younger than age 32, get at least 1,200 mg of calcium and at least 600 international units (IU) of vitamin D per day. If you are older than age 75, get at least 1,200 mg of calcium and at least 800 international units (IU) of vitamin D per day. Smoking and drinking excessive alcohol increase the risk of osteoporosis. Eat foods that are rich in calcium and vitamin D, and do weight-bearing exercises several times each week as directed by your health care provider. How does menopause affect my mental health? Depression may occur at any age, but it is more common as you become older. Common symptoms of depression include: Feeling depressed. Changes in sleep patterns. Changes in appetite or eating patterns. Feeling an overall lack of motivation or enjoyment of activities that you previously enjoyed. Frequent crying spells. Talk with your health care provider if you think that you are experiencing any of these symptoms. General instructions See your health care provider for regular wellness exams and vaccines. This may include: Scheduling regular health, dental, and eye exams. Getting and maintaining your vaccines. These include: Influenza vaccine. Get this vaccine each year before the flu season begins. Pneumonia vaccine. Shingles vaccine. Tetanus, diphtheria, and pertussis (Tdap) booster vaccine. Your health care provider may also recommend other  immunizations. Tell your health care provider if you have ever been abused or do not feel safe at home. Summary Menopause is a normal process in which your ability to get pregnant comes to an end. This condition causes hot flashes, night sweats, decreased interest in sex, mood swings, headaches, or lack of sleep. Treatment for this condition may include hormone replacement therapy. Take actions to keep yourself healthy, including exercising regularly, eating a healthy diet, watching your weight, and checking your blood pressure and blood sugar levels. Get screened for cancer and depression. Make sure that you are up to date with all your vaccines. This information is not intended to replace advice given to you by your health care provider. Make sure you discuss any questions you have with your health care provider. Document Revised: 03/30/2021 Document Reviewed: 03/30/2021 Elsevier Patient Education  2024 [employer].

## 2023-10-03 NOTE — Assessment & Plan Note (Signed)
Chronic, stable.  Continue amlodipine 5 mg, hydrochlorothiazide 12.5mg, losartan 100mg, toprol 50mg qd, potassium chloride 10 mEq daily. 

## 2023-10-03 NOTE — Addendum Note (Signed)
Addended by: [place], Kalisi Bevill on: 10/03/2023 03:03 PM   Modules accepted: Orders

## 2023-10-03 NOTE — Progress Notes (Signed)
Assessment & Plan:  Routine health maintenance Assessment & Plan: Clinical breast exam performed.  Ordered diagnostic images of the left breast.  Encouraged continued exercise.  Influenza vaccine given today  Orders: -     MM 3D DIAGNOSTIC MAMMOGRAM UNILATERAL LEFT BREAST; Future -     [place] LIMITED ULTRASOUND INCLUDING AXILLA LEFT BREAST ; Future  Hypertension, unspecified type Assessment & Plan: Chronic, stable.  Continue amlodipine 5 mg, hydrochlorothiazide 12.5mg , losartan 100mg , toprol 50mg  qd, potassium chloride 10 mEq daily.      Return precautions given.   Risks, benefits, and alternatives of the medications and treatment plan prescribed today were discussed, and patient expressed understanding.   Education regarding symptom management and diagnosis given to patient on AVS either electronically or printed.  No follow-ups on file.  Rennie Plowman, FNP  Subjective:    Patient ID: Michelle Clark, female    DOB: 1946/03/30, 77 y.o.   MRN: [id number]  CC: Michelle Clark is a 77 y.o. female who presents today for physical exam.    HPI: She fell 3-4 months ago when she slipped on wet handicap ramp. She feel on her buttucks.  She didn't fall on wrists/hands outstretched.   She has noticed 1 month ago , left wrist started to hurt.  Hurts to lift heavy items such as 24 pack of water or opening a bottle of water with twisting.    She takes tylenol 650mg  with relief.  No numbness  She is right handed.   Compliant with amlodipine 5 mg, hydrochlorothiazide 12.5mg ,toprol 50mg ,  losartan 100mg , potassium chloride 10 mEq daily.      Colorectal Cancer Screening: UTD , repeat in 5 years.  Breast Cancer Screening: Mammogram UTD of left breast; history of right mastectomy. Cervical Cancer Screening: She is no longer screening for cervical cancer.  History of hysterectomy due to fibroids.  No history of GYN cancer.  she has a cervix  Bone Health screening/DEXA for 65+:  Up-to-date, history of osteopenia  Lung Cancer Screening: Doesn't have 20 year pack year history and age > 65 years yo 3 years.  She quit smoking more than 15 years ago        Tetanus - UTD Exercise: Gets regular exercise with walking and housework.   Alcohol use:  none Smoking/tobacco use: former smoker.    Health Maintenance  Topic Date Due   Zoster (Shingles) Vaccine (1 of 2) 01/03/1964   COVID-19 Vaccine (3 - Moderna risk series) 03/15/2020   Flu Shot  06/23/2023   Medicare Annual Wellness Visit  10/02/2023   Colon Cancer Screening  01/07/2025   DTaP/Tdap/Td vaccine (2 - Td or Tdap) 09/15/2026   Pneumonia Vaccine  Completed   DEXA scan (bone density measurement)  Completed   Hepatitis C Screening  Completed   HPV Vaccine  Aged Out    ALLERGIES: Fluoxetine  Current Outpatient Medications on File Prior to Visit  Medication Sig Dispense Refill   amLODipine (NORVASC) 5 MG tablet Take 1 tablet (5 mg total) by mouth daily. 90 tablet 3   buPROPion (WELLBUTRIN XL) 300 MG 24 hr tablet Take 1 tablet (300 mg total) by mouth every morning. 90 tablet 3   calcium carbonate (OSCAL) 1500 (600 Ca) MG TABS tablet Take 600 mg of elemental calcium by mouth daily with breakfast.     Cholecalciferol (VITAMIN D) 2000 units tablet Take 1 tablet by mouth daily.     cyclobenzaprine (FLEXERIL) 5 MG tablet TAKE 1 TABLET BY MOUTH  EVERY NIGHT AT BEDTIME AS NEEDED FOR MUSCLE SPASMS 30 tablet 0   hydrochlorothiazide (MICROZIDE) 12.5 MG capsule TAKE ONE CAPSULE BY MOUTH ONCE DAILY 90 capsule 2   losartan (COZAAR) 100 MG tablet TAKE ONE TABLET BY MOUTH ONCE DAILY 90 tablet 3   metoprolol succinate (TOPROL-XL) 50 MG 24 hr tablet TAKE 1 TABLET BY MOUTH ONCE A DAY IMMEDIATELY FOLLOWING A MEAL 90 tablet 3   Multiple Vitamin (MULTIVITAMIN) tablet Take 1 tablet by mouth daily.     mupirocin ointment (BACTROBAN) 2 % Apply 1 Application topically 2 (two) times daily. 22 g 2   potassium chloride (KLOR-CON) 10 MEQ  tablet TAKE ONE TABLET BY MOUTH DAILY 90 tablet 3   sertraline (ZOLOFT) 100 MG tablet TAKE ONE TABLET BY MOUTH EVERY NIGHT AT BEDTIME 90 tablet 1   simvastatin (ZOCOR) 20 MG tablet Take 1 tablet (20 mg total) by mouth daily. 90 tablet 3   No current facility-administered medications on file prior to visit.    Review of Systems  Constitutional:  Negative for chills and fever.  Respiratory:  Negative for cough.   Cardiovascular:  Negative for chest pain and palpitations.  Gastrointestinal:  Negative for nausea and vomiting.  Musculoskeletal:  Positive for arthralgias (right wrist).  Neurological:  Negative for numbness.      Objective:    BP 136/86   Pulse 67   Temp 97.8 F (36.6 C) (Oral)   Ht 5\' 6"  (1.676 m)   Wt 176 lb 9.6 oz (80.1 kg)   SpO2 97%   BMI 28.50 kg/m   BP Readings from Last 3 Encounters:  10/03/23 136/86  04/12/23 128/82  12/08/22 122/78   Wt Readings from Last 3 Encounters:  10/03/23 176 lb 9.6 oz (80.1 kg)  04/12/23 177 lb (80.3 kg)  12/08/22 180 lb 6.4 oz (81.8 kg)    Physical Exam Vitals reviewed.  Constitutional:      Appearance: She is well-developed.  Eyes:     Conjunctiva/sclera: Conjunctivae normal.  Neck:     Thyroid: No thyroid mass or thyromegaly.  Cardiovascular:     Rate and Rhythm: Normal rate and regular rhythm.     Pulses: Normal pulses.     Heart sounds: Normal heart sounds.  Pulmonary:     Effort: Pulmonary effort is normal.     Breath sounds: Normal breath sounds. No wheezing, rhonchi or rales.  Chest:  Breasts:    Right: Absent.     Left: No inverted nipple, mass, nipple discharge, skin change or tenderness.       Comments:  Left breast tenderness approximately 9:00.  No circumscribed mass Lymphadenopathy:     Head:     Right side of head: No submental, submandibular, tonsillar, preauricular, posterior auricular or occipital adenopathy.     Left side of head: No submental, submandibular, tonsillar, preauricular,  posterior auricular or occipital adenopathy.     Cervical: No cervical adenopathy.     Right cervical: No superficial, deep or posterior cervical adenopathy.    Left cervical: No superficial, deep or posterior cervical adenopathy.  Skin:    General: Skin is warm and dry.  Neurological:     Mental Status: She is alert.  Psychiatric:        Speech: Speech normal.        Behavior: Behavior normal.        Thought Content: Thought content normal.

## 2023-10-03 NOTE — Assessment & Plan Note (Signed)
Clinical breast exam performed.  Ordered diagnostic images of the left breast.  Encouraged continued exercise.  Influenza vaccine given today

## 2023-10-06 NOTE — Progress Notes (Signed)
This encounter was created in error - please disregard.

## 2023-10-11 ENCOUNTER — Ambulatory Visit (INDEPENDENT_AMBULATORY_CARE_PROVIDER_SITE_OTHER): Payer: 59 | Admitting: *Deleted

## 2023-10-11 VITALS — Ht 66.0 in | Wt 176.0 lb

## 2023-10-11 DIAGNOSIS — Z0000 Encounter for general adult medical examination without abnormal findings: Secondary | ICD-10-CM | POA: Diagnosis not present

## 2023-10-11 NOTE — Patient Instructions (Signed)
Michelle Clark , Thank you for taking time to come for your Medicare Wellness Visit. I appreciate your ongoing commitment to your health goals. Please review the following plan we discussed and let me know if I can assist you in the future.   Referrals/Orders/Follow-Ups/Clinician Recommendations: Remember to update your vaccines  This is a list of the screening recommended for you and due dates:  Health Maintenance  Topic Date Due   Zoster (Shingles) Vaccine (1 of 2) 01/03/1964   COVID-19 Vaccine (3 - Moderna risk series) 03/15/2020   Mammogram  03/28/2024   Medicare Annual Wellness Visit  10/10/2024   Colon Cancer Screening  01/07/2025   DTaP/Tdap/Td vaccine (2 - Td or Tdap) 09/15/2026   Pneumonia Vaccine  Completed   Flu Shot  Completed   DEXA scan (bone density measurement)  Completed   Hepatitis C Screening  Completed   HPV Vaccine  Aged Out    Advanced directives: (In Chart) A copy of your advanced directives are scanned into your chart should your provider ever need it.  Next Medicare Annual Wellness Visit scheduled for next year: Yes 10/15/24 @ 1:20

## 2023-10-11 NOTE — Progress Notes (Signed)
Subjective:   Michelle Clark is a 77 y.o. female who presents for Medicare Annual (Subsequent) preventive examination.  Visit Complete: Virtual I connected with  Michelle Clark on 10/11/23 by a audio enabled telemedicine application and verified that I am speaking with the correct person using two identifiers.  Patient Location: Home  Provider Location: Office/Clinic  I discussed the limitations of evaluation and management by telemedicine. The patient expressed understanding and agreed to proceed.  Vital Signs: Because this visit was a virtual/telehealth visit, some criteria may be missing or patient reported. Any vitals not documented were not able to be obtained and vitals that have been documented are patient reported.   Cardiac Risk Factors include: advanced age (>44men, >13 women);dyslipidemia;hypertension     Objective:    Today's Vitals   10/11/23 1027  Weight: 176 lb (79.8 kg)  Height: 5\' 6"  (1.676 m)   Body mass index is 28.41 kg/m.     10/11/2023   10:41 AM 10/01/2022    2:39 PM 08/17/2022   12:14 PM 09/30/2021    8:32 AM 07/23/2021    7:18 PM 09/29/2020    8:39 AM 01/08/2020   11:17 AM  Advanced Directives  Does Patient Have a Medical Advance Directive? Yes Yes Yes Yes No Yes Yes  Type of Estate agent of [place];Living will Living will Living will Healthcare Power of [place];Living will  Healthcare Power of [place];Living will Living will  Does patient want to make changes to medical advance directive? No - Patient declined No - Patient declined No - Patient declined No - Patient declined  No - Patient declined No - Patient declined  Copy of Healthcare Power of Attorney in Chart? Yes - validated most recent copy scanned in chart (See row information)   Yes - validated most recent copy scanned in chart (See row information)  Yes - validated most recent copy scanned in chart (See row information)   Would patient like information on  creating a medical advance directive?     Yes (ED - Information included in AVS)      Current Medications (verified) Outpatient Encounter Medications as of 10/11/2023  Medication Sig   amLODipine (NORVASC) 5 MG tablet Take 1 tablet (5 mg total) by mouth daily.   buPROPion (WELLBUTRIN XL) 300 MG 24 hr tablet Take 1 tablet (300 mg total) by mouth every morning.   calcium carbonate (OSCAL) 1500 (600 Ca) MG TABS tablet Take 600 mg of elemental calcium by mouth daily with breakfast.   Cholecalciferol (VITAMIN D) 2000 units tablet Take 1 tablet by mouth daily.   cyclobenzaprine (FLEXERIL) 5 MG tablet TAKE 1 TABLET BY MOUTH EVERY NIGHT AT BEDTIME AS NEEDED FOR MUSCLE SPASMS   hydrochlorothiazide (MICROZIDE) 12.5 MG capsule TAKE ONE CAPSULE BY MOUTH ONCE DAILY   losartan (COZAAR) 100 MG tablet TAKE ONE TABLET BY MOUTH ONCE DAILY   metoprolol succinate (TOPROL-XL) 50 MG 24 hr tablet TAKE 1 TABLET BY MOUTH ONCE A DAY IMMEDIATELY FOLLOWING A MEAL   Multiple Vitamin (MULTIVITAMIN) tablet Take 1 tablet by mouth daily.   potassium chloride (KLOR-CON) 10 MEQ tablet TAKE ONE TABLET BY MOUTH DAILY   sertraline (ZOLOFT) 100 MG tablet TAKE ONE TABLET BY MOUTH EVERY NIGHT AT BEDTIME   simvastatin (ZOCOR) 20 MG tablet Take 1 tablet (20 mg total) by mouth daily.   mupirocin ointment (BACTROBAN) 2 % Apply 1 Application topically 2 (two) times daily. (Patient not taking: Reported on 10/11/2023)   No facility-administered  encounter medications on file as of 10/11/2023.    Allergies (verified) Fluoxetine   History: Past Medical History:  Diagnosis Date   Cancer (HCC) 2001   Right breast, s/p chemotherapy, XRT and mastectomy, Dr. Koleen Nimrod   Colon polyps    Hyperlipidemia    Hypertension    Personal history of chemotherapy    Personal history of radiation therapy    Pre-diabetes    Past Surgical History:  Procedure Laterality Date   ABDOMINAL HYSTERECTOMY  1998   cervix intact; for noncancerous reason,  fibroids.   APPENDECTOMY     BREAST SURGERY  2002   mastectomy   CATARACT EXTRACTION W/PHACO Right 10/09/2019   Procedure: CATARACT EXTRACTION PHACO AND INTRAOCULAR LENS PLACEMENT (IOC) RIGHT DIABETIC 9.43, 00:53;  Surgeon: Galen Manila, MD;  Location: [facility];  Service: Ophthalmology;  Laterality: Right;   CATARACT EXTRACTION W/PHACO Left 08/17/2022   Procedure: CATARACT EXTRACTION PHACO AND INTRAOCULAR LENS PLACEMENT (IOC) LEFT 6.76 00:39.8 ;  Surgeon: Galen Manila, MD;  Location: [facility];  Service: Ophthalmology;  Laterality: Left;   CHOLECYSTECTOMY     COLONOSCOPY WITH PROPOFOL N/A 01/08/2020   Procedure: COLONOSCOPY WITH PROPOFOL;  Surgeon: Toney Reil, MD;  Location: [facility];  Service: Gastroenterology;  Laterality: N/A;   MASTECTOMY Right 2001   chemo, rad   OOPHORECTOMY     POLYPECTOMY  01/08/2020   Procedure: POLYPECTOMY;  Surgeon: Toney Reil, MD;  Location: [facility];  Service: Gastroenterology;;   TUBAL LIGATION     Family History  Problem Relation Age of Onset   Arthritis Mother    Stroke Mother 31   Arthritis Sister    Stroke Sister 76       paralyzed left side   Breast cancer Sister 36   Cancer Sister        breast   Heart disease Brother    Asthma Brother    Colon cancer Brother    Lupus Daughter    Lung cancer Son    Breast cancer Cousin        maternal - 15's   Breast cancer Other    Social History   Socioeconomic History   Marital status: Married    Spouse name: Not on file   Number of children: Not on file   Years of education: Not on file   Highest education level: Not on file  Occupational History   Not on file  Tobacco Use   Smoking status: Former    Current packs/day: 0.00    Types: Cigarettes    Quit date: 04/10/1997    Years since quitting: 26.5   Smokeless tobacco: Never  Vaping Use   Vaping status: Never Used  Substance and Sexual Activity   Alcohol use: Not  Currently    Alcohol/week: 1.0 standard drink of alcohol    Types: 1 Glasses of wine per week   Drug use: No   Sexual activity: Not Currently  Other Topics Concern   Not on file  Social History Narrative   Lives in [place] with husband. No pets      Work - retired, Consulting civil engineer      Diet - regular      Exercise - none   Social Determinants of Health   Financial Resource Strain: Low Risk  (10/11/2023)   Overall Financial Resource Strain (CARDIA)    Difficulty of Paying Living Expenses: Not hard at all  Food Insecurity: No Food Insecurity (10/11/2023)  Hunger Vital Sign    Worried About Running Out of Food in the Last Year: Never true    Ran Out of Food in the Last Year: Never true  Transportation Needs: No Transportation Needs (10/11/2023)   PRAPARE - Administrator, Civil Service (Medical): No    Lack of Transportation (Non-Medical): No  Physical Activity: Inactive (10/11/2023)   Exercise Vital Sign    Days of Exercise per Week: 0 days    Minutes of Exercise per Session: 0 min  Stress: No Stress Concern Present (10/11/2023)   [employer] of Occupational Health - Occupational Stress Questionnaire    Feeling of Stress : Only a little  Social Connections: Moderately Integrated (10/11/2023)   Social Connection and Isolation Panel [NHANES]    Frequency of Communication with Friends and Family: More than three times a week    Frequency of Social Gatherings with Friends and Family: More than three times a week    Attends Religious Services: More than 4 times per year    Active Member of [employer] or Organizations: No    Attends Engineer, structural: Never    Marital Status: Married    Tobacco Counseling Counseling given: Not Answered   Clinical Intake:  Pre-visit preparation completed: Yes  Pain : No/denies pain     BMI - recorded: 28.41 Nutritional Status: BMI 25 -29 Overweight Nutritional Risks: None Diabetes: No  How often do  you need to have someone help you when you read instructions, pamphlets, or other written materials from your doctor or pharmacy?: 1 - Never  Interpreter Needed?: No  Information entered by :: R. Korissa Horsford LPN   Activities of Daily Living    10/11/2023   10:30 AM  In your present state of health, do you have any difficulty performing the following activities:  Hearing? 0  Vision? 0  Comment readers  Difficulty concentrating or making decisions? 0  Walking or climbing stairs? 0  Dressing or bathing? 0  Doing errands, shopping? 0  Preparing Food and eating ? N  Using the Toilet? N  In the past six months, have you accidently leaked urine? N  Do you have problems with loss of bowel control? N  Managing your Medications? N  Managing your Finances? N  Housekeeping or managing your Housekeeping? N    Patient Care Team: Allegra Grana, FNP as PCP - General (Family Medicine)  Indicate any recent Medical Services you may have received from other than [facility] providers in the past year (date may be approximate).     Assessment:   This is a routine wellness examination for Pauline.  Hearing/Vision screen Hearing Screening - Comments:: No issues Vision Screening - Comments:: reader   Goals Addressed             This Visit's Progress    Patient Stated       Continue to eat healthy and maintain       Depression Screen    10/11/2023   10:35 AM 10/03/2023    2:15 PM 04/12/2023   10:43 AM 12/08/2022   10:52 AM 10/01/2022    2:41 PM 08/30/2022    9:39 AM 06/02/2022    2:34 PM  PHQ 2/9 Scores  PHQ - 2 Score 0 0 0 0 0 0 0  PHQ- 9 Score 0 0 0 0 0 0     Fall Risk    10/11/2023   10:31 AM 10/03/2023    2:15 PM 04/12/2023  10:42 AM 12/08/2022   10:52 AM 10/01/2022    2:44 PM  Fall Risk   Falls in the past year? 1 0 0 0 0  Number falls in past yr: 0 0 0 0 0  Injury with Fall? 0 0 0 0 0  Risk for fall due to : History of fall(s);Impaired balance/gait No Fall Risks No Fall  Risks No Fall Risks   Follow up Falls evaluation completed;Falls prevention discussed Falls evaluation completed Falls evaluation completed Falls evaluation completed Falls evaluation completed;Falls prevention discussed    MEDICARE RISK AT HOME: Medicare Risk at Home Any stairs in or around the home?: No If so, are there any without handrails?: No Home free of loose throw rugs in walkways, pet beds, electrical cords, etc?: Yes Adequate lighting in your home to reduce risk of falls?: Yes Life alert?: Yes Use of a cane, walker or w/c?: No Grab bars in the bathroom?: Yes Shower chair or bench in shower?: Yes Elevated toilet seat or a handicapped toilet?: No   Cognitive Function:    09/15/2017    8:40 AM  MMSE - Mini Mental State Exam  Orientation to time 5  Orientation to Place 5  Registration 3  Attention/ Calculation 5  Recall 3  Language- name 2 objects 2  Language- repeat 1  Language- follow 3 step command 3  Language- read & follow direction 1  Write a sentence 1  Copy design 1  Total score 30        10/11/2023   10:41 AM 10/01/2022    2:46 PM 09/30/2021    8:40 AM 09/29/2020    8:58 AM 09/28/2019    8:54 AM  6CIT Screen  What Year? 0 points 0 points 0 points 0 points 0 points  What month? 0 points 0 points 0 points 0 points 0 points  What time? 0 points 0 points 0 points 0 points 0 points  Count back from 20 0 points 0 points 0 points  0 points  Months in reverse 0 points 0 points 0 points 0 points 0 points  Repeat phrase 0 points 0 points 0 points 0 points 0 points  Total Score 0 points 0 points 0 points  0 points    Immunizations Immunization History  Administered Date(s) Administered   Fluad Quad(high Dose 65+) 08/01/2019, 10/01/2020, 10/26/2021, 08/30/2022   Fluad Trivalent(High Dose 65+) 10/03/2023   Influenza, High Dose Seasonal PF 09/15/2016, 09/15/2017, 09/25/2018   Influenza,inj,Quad PF,6+ Mos 10/15/2013, 09/02/2014, 09/16/2015    Influenza-Unspecified 09/10/2012   Moderna Sars-Covid-2 Vaccination 01/19/2020, 02/16/2020   Pneumococcal Conjugate-13 04/19/2014   Pneumococcal Polysaccharide-23 04/10/2013   Tdap 09/15/2016   Zoster, Live 04/11/2011    TDAP status: Up to date  Flu Vaccine status: Up to date  Pneumococcal vaccine status: Up to date  Covid-19 vaccine status: Information provided on how to obtain vaccines.   Qualifies for Shingles Vaccine? Yes   Zostavax completed Yes   Shingrix Completed?: No.    Education has been provided regarding the importance of this vaccine. Patient has been advised to call insurance company to determine out of pocket expense if they have not yet received this vaccine. Advised may also receive vaccine at local pharmacy or Health Dept. Verbalized acceptance and understanding.  Screening Tests Health Maintenance  Topic Date Due   Zoster Vaccines- Shingrix (1 of 2) 01[birth year]03/1965   COVID-19 Vaccine (3 - Moderna risk series) 03/15/2020   Medicare Annual Wellness (AWV)  10/02/2023   Colonoscopy  01/07/2025   DTaP/Tdap/Td (2 - Td or Tdap) 09/15/2026   Pneumonia Vaccine 1+ Years old  Completed   INFLUENZA VACCINE  Completed   DEXA SCAN  Completed   Hepatitis C Screening  Completed   HPV VACCINES  Aged Out    Health Maintenance  Health Maintenance Due  Topic Date Due   Zoster Vaccines- Shingrix (1 of 2) 01/03/1965   COVID-19 Vaccine (3 - Moderna risk series) 03/15/2020   Medicare Annual Wellness (AWV)  10/02/2023    Colorectal cancer screening: Type of screening: Colonoscopy. Completed 12/2019. Repeat every 5 years  Mammogram status: Completed 03/2023. Repeat every year  Bone Density status: Completed 04/2023. Results reflect: Bone density results: OSTEOPENIA. Repeat every 2 years.  Lung Cancer Screening: (Low Dose CT Chest recommended if Age 60-80 years, 20 pack-year currently smoking OR have quit w/in 15years.) does not qualify.     Additional Screening:  Hepatitis  C Screening: does qualify; Completed 08/2016  Vision Screening: Recommended annual ophthalmology exams for early detection of glaucoma and other disorders of the eye. Is the patient up to date with their annual eye exam?  Yes  Who is the provider or what is the name of the office in which the patient attends annual eye exams? [facility] If pt is not established with a provider, would they like to be referred to a provider to establish care? No .   Dental Screening: Recommended annual dental exams for proper oral hygiene    Community Resource Referral / Chronic Care Management: CRR required this visit?  No   CCM required this visit?  No     Plan:     I have personally reviewed and noted the following in the patient's chart:   Medical and social history Use of alcohol, tobacco or illicit drugs  Current medications and supplements including opioid prescriptions. Patient is not currently taking opioid prescriptions. Functional ability and status Nutritional status Physical activity Advanced directives List of other physicians Hospitalizations, surgeries, and ER visits in previous 12 months Vitals Screenings to include cognitive, depression, and falls Referrals and appointments  In addition, I have reviewed and discussed with patient certain preventive protocols, quality metrics, and best practice recommendations. A written personalized care plan for preventive services as well as general preventive health recommendations were provided to patient.     Sydell Axon, LPN   [id number]   After Visit Summary: (MyChart) Due to this being a telephonic visit, the after visit summary with patients personalized plan was offered to patient via MyChart   Nurse Notes: None

## 2023-10-14 ENCOUNTER — Other Ambulatory Visit: Payer: 59

## 2023-10-14 ENCOUNTER — Inpatient Hospital Stay: Admission: RE | Admit: 2023-10-14 | Payer: 59 | Source: Ambulatory Visit

## 2023-10-14 ENCOUNTER — Ambulatory Visit: Payer: 59 | Admitting: Family

## 2023-10-24 ENCOUNTER — Ambulatory Visit
Admission: RE | Admit: 2023-10-24 | Discharge: 2023-10-24 | Disposition: A | Discharge status: Home / Self Care | Payer: 59 | Source: Ambulatory Visit | Attending: Family | Admitting: Family

## 2023-10-24 DIAGNOSIS — N644 Mastodynia: Secondary | ICD-10-CM | POA: Diagnosis not present

## 2023-10-24 DIAGNOSIS — R92333 Mammographic heterogeneous density, bilateral breasts: Secondary | ICD-10-CM | POA: Diagnosis not present

## 2023-10-24 DIAGNOSIS — Z853 Personal history of malignant neoplasm of breast: Secondary | ICD-10-CM

## 2024-01-17 ENCOUNTER — Other Ambulatory Visit: Payer: Self-pay | Admitting: Family

## 2024-01-17 DIAGNOSIS — F339 Major depressive disorder, recurrent, unspecified: Secondary | ICD-10-CM

## 2024-02-08 ENCOUNTER — Other Ambulatory Visit: Payer: Self-pay | Admitting: Family

## 2024-02-08 DIAGNOSIS — I10 Essential (primary) hypertension: Secondary | ICD-10-CM

## 2024-03-07 ENCOUNTER — Other Ambulatory Visit: Payer: Self-pay | Admitting: Family

## 2024-03-21 DIAGNOSIS — Z961 Presence of intraocular lens: Secondary | ICD-10-CM | POA: Diagnosis not present

## 2024-03-21 DIAGNOSIS — H43813 Vitreous degeneration, bilateral: Secondary | ICD-10-CM | POA: Diagnosis not present

## 2024-04-02 ENCOUNTER — Other Ambulatory Visit: Payer: Self-pay | Admitting: Family

## 2024-04-02 NOTE — Telephone Encounter (Signed)
 Tried to reach pt to schedule a needed appt. Call was disconnected. Will try to reach pt again

## 2024-04-04 NOTE — Telephone Encounter (Signed)
 Refilled: 02/15/2023 Last OV: 10/03/2023 Next OV: not scheduled

## 2024-04-05 NOTE — Telephone Encounter (Signed)
 LVM to inform pt that Potassium has been refilled and pt needs to call back to schedule f/up in 3 mnths for bp. Please schedule when pt calls back

## 2024-04-10 NOTE — Telephone Encounter (Signed)
 Lvm for pt to give office a call back. Pt needs appt. Please schedule if pt calls back

## 2024-04-13 ENCOUNTER — Other Ambulatory Visit: Payer: Self-pay | Admitting: Family

## 2024-04-13 DIAGNOSIS — F339 Major depressive disorder, recurrent, unspecified: Secondary | ICD-10-CM

## 2024-06-05 ENCOUNTER — Ambulatory Visit (INDEPENDENT_AMBULATORY_CARE_PROVIDER_SITE_OTHER): Admitting: Family

## 2024-06-05 ENCOUNTER — Encounter: Payer: Self-pay | Admitting: Family

## 2024-06-05 VITALS — BP 142/92 | HR 89 | Temp 97.6°F | Ht 66.0 in | Wt 177.8 lb

## 2024-06-05 DIAGNOSIS — E782 Mixed hyperlipidemia: Secondary | ICD-10-CM | POA: Diagnosis not present

## 2024-06-05 DIAGNOSIS — F339 Major depressive disorder, recurrent, unspecified: Secondary | ICD-10-CM

## 2024-06-05 DIAGNOSIS — I10 Essential (primary) hypertension: Secondary | ICD-10-CM | POA: Diagnosis not present

## 2024-06-05 DIAGNOSIS — Z1231 Encounter for screening mammogram for malignant neoplasm of breast: Secondary | ICD-10-CM

## 2024-06-05 LAB — CBC WITH DIFFERENTIAL/PLATELET
Basophils Absolute: 0.1 K/uL (ref 0.0–0.1)
Basophils Relative: 1.1 % (ref 0.0–3.0)
Eosinophils Absolute: 0.1 K/uL (ref 0.0–0.7)
Eosinophils Relative: 2 % (ref 0.0–5.0)
HCT: 38.1 % (ref 36.0–46.0)
Hemoglobin: 12.5 g/dL (ref 12.0–15.0)
Lymphocytes Relative: 40.1 % (ref 12.0–46.0)
Lymphs Abs: 2.4 K/uL (ref 0.7–4.0)
MCHC: 32.7 g/dL (ref 30.0–36.0)
MCV: 86.3 fl (ref 78.0–100.0)
Monocytes Absolute: 0.6 K/uL (ref 0.1–1.0)
Monocytes Relative: 9.9 % (ref 3.0–12.0)
Neutro Abs: 2.8 K/uL (ref 1.4–7.7)
Neutrophils Relative %: 46.9 % (ref 43.0–77.0)
Platelets: 199 K/uL (ref 150.0–400.0)
RBC: 4.41 Mil/uL (ref 3.87–5.11)
RDW: 14.4 % (ref 11.5–15.5)
WBC: 6 K/uL (ref 4.0–10.5)

## 2024-06-05 LAB — LIPID PANEL
Cholesterol: 158 mg/dL (ref 0–200)
HDL: 60.3 mg/dL (ref >39.00)
LDL Cholesterol: 84 mg/dL (ref 0–99)
NonHDL: 98.01
Total CHOL/HDL Ratio: 3
Triglycerides: 71 mg/dL (ref 0.0–149.0)
VLDL: 14.2 mg/dL (ref 0.0–40.0)

## 2024-06-05 LAB — COMPREHENSIVE METABOLIC PANEL WITH GFR
ALT: 24 U/L (ref 0–35)
AST: 25 U/L (ref 0–37)
Albumin: 4.4 g/dL (ref 3.5–5.2)
Alkaline Phosphatase: 70 U/L (ref 39–117)
BUN: 19 mg/dL (ref 6–23)
CO2: 26 meq/L (ref 19–32)
Calcium: 9.1 mg/dL (ref 8.4–10.5)
Chloride: 105 meq/L (ref 96–112)
Creatinine, Ser: 0.89 mg/dL (ref 0.40–1.20)
GFR: 61.66 mL/min (ref >60.00)
Glucose, Bld: 86 mg/dL (ref 70–99)
Potassium: 3.5 meq/L (ref 3.5–5.1)
Sodium: 138 meq/L (ref 135–145)
Total Bilirubin: 0.4 mg/dL (ref 0.2–1.2)
Total Protein: 7.5 g/dL (ref 6.0–8.3)

## 2024-06-05 LAB — VITAMIN D 25 HYDROXY (VIT D DEFICIENCY, FRACTURES): VITD: 63.93 ng/mL (ref 30.00–100.00)

## 2024-06-05 LAB — TSH: TSH: 3.15 u[IU]/mL (ref 0.35–5.50)

## 2024-06-05 LAB — HEMOGLOBIN A1C: Hgb A1c MFr Bld: 5.8 % (ref 4.6–6.5)

## 2024-06-05 MED ORDER — SERTRALINE HCL 100 MG PO TABS: 100.0000 mg | ORAL_TABLET | Freq: Every day | ORAL | 3 refills | Status: AC

## 2024-06-05 MED ORDER — AMLODIPINE BESYLATE 5 MG PO TABS: 5.0000 mg | ORAL_TABLET | Freq: Every day | ORAL | 3 refills | Status: AC

## 2024-06-05 MED ORDER — METOPROLOL SUCCINATE ER 50 MG PO TB24: ORAL_TABLET | ORAL | 3 refills | Status: AC

## 2024-06-05 MED ORDER — BUPROPION HCL ER (XL) 300 MG PO TB24: 300.0000 mg | ORAL_TABLET | Freq: Every day | ORAL | 3 refills | Status: AC

## 2024-06-05 MED ORDER — SIMVASTATIN 20 MG PO TABS: 20.0000 mg | ORAL_TABLET | Freq: Every day | ORAL | 3 refills | Status: AC

## 2024-06-05 NOTE — Patient Instructions (Addendum)
 It is imperative that you are seen AT least twice per year for labs and monitoring. Monitor blood pressure at home and me 5-6 reading on separate days. Goal is less than 120/80, based on newest guidelines, however we certainly want to be less than 130/80;  if persistently higher, please make sooner follow up appointment so we can recheck you blood pressure and manage/ adjust medications.  Let us  know if you dont hear back within a week in regards to an appointment being scheduled.   So that you are aware, if you are [facility] MyChart user , please pay attention to your MyChart messages as you may receive a MyChart message with a phone number to call and schedule this test/appointment own your own from our referral coordinator. This is a new process so I do not want you to miss this message.  If you are not a MyChart user, you will receive a phone call.    Managing Your Hypertension Hypertension, also called high blood pressure, is when the force of the blood pressing against the walls of the arteries is too strong. Arteries are blood vessels that carry blood from your heart throughout your body. Hypertension forces the heart to work harder to pump blood and may cause the arteries to become narrow or stiff. Understanding blood pressure readings A blood pressure reading includes a higher number over a lower number: The first, or top, number is called the systolic pressure. It is a measure of the pressure in your arteries as your heart beats. The second, or bottom number, is called the diastolic pressure. It is a measure of the pressure in your arteries as the heart relaxes. For most people, a normal blood pressure is below 120/80. Your personal target blood pressure may vary depending on your medical conditions, your age, and other factors. Blood pressure is classified into four stages. Based on your blood pressure reading, your health care provider may use the following stages to determine what type of  treatment you need, if any. Systolic pressure and diastolic pressure are measured in a unit called millimeters of mercury (mmHg). Normal Systolic pressure: below 120. Diastolic pressure: below 80. Elevated Systolic pressure: 120-129. Diastolic pressure: below 80. Hypertension stage 1 Systolic pressure: 130-139. Diastolic pressure: 80-89. Hypertension stage 2 Systolic pressure: 140 or above. Diastolic pressure: 90 or above. How can this condition affect me? Managing your hypertension is very important. Over time, hypertension can damage the arteries and decrease blood flow to parts of the body, including the brain, heart, and kidneys. Having untreated or uncontrolled hypertension can lead to: A heart attack. A stroke. A weakened blood vessel (aneurysm). Heart failure. Kidney damage. Eye damage. Memory and concentration problems. Vascular dementia. What actions can I take to manage this condition? Hypertension can be managed by making lifestyle changes and possibly by taking medicines. Your health care provider will help you make a plan to bring your blood pressure within a normal range. You may be referred for counseling on a healthy diet and physical activity. Nutrition  Eat a diet that is high in fiber and potassium, and low in salt (sodium), added sugar, and fat. An example eating plan is called the DASH diet. DASH stands for Dietary Approaches to Stop Hypertension. To eat this way: Eat plenty of fresh fruits and vegetables. Try to fill one-half of your plate at each meal with fruits and vegetables. Eat whole grains, such as whole-wheat pasta, brown rice, or whole-grain bread. Fill about one-fourth of your plate with  whole grains. Eat low-fat dairy products. Avoid fatty cuts of meat, processed or cured meats, and poultry with skin. Fill about one-fourth of your plate with lean proteins such as fish, chicken without skin, beans, eggs, and tofu. Avoid pre-made and processed foods.  These tend to be higher in sodium, added sugar, and fat. Reduce your daily sodium intake. Many people with hypertension should eat less than 1,500 mg of sodium a day. Lifestyle  Work with your health care provider to maintain a healthy body weight or to lose weight. Ask what an ideal weight is for you. Get at least 30 minutes of exercise that causes your heart to beat faster (aerobic exercise) most days of the week. Activities may include walking, swimming, or biking. Include exercise to strengthen your muscles (resistance exercise), such as weight lifting, as part of your weekly exercise routine. Try to do these types of exercises for 30 minutes at least 3 days a week. Do not use any products that contain nicotine or tobacco. These products include cigarettes, chewing tobacco, and vaping devices, such as e-cigarettes. If you need help quitting, ask your health care provider. Control any long-term (chronic) conditions you have, such as high cholesterol or diabetes. Identify your sources of stress and find ways to manage stress. This may include meditation, deep breathing, or making time for fun activities. Alcohol use Do not drink alcohol if: Your health care provider tells you not to drink. You are pregnant, may be pregnant, or are planning to become pregnant. If you drink alcohol: Limit how much you have to: 0-1 drink a day for women. 0-2 drinks a day for men. Know how much alcohol is in your drink. In the [place] one drink equals one 12 oz bottle of beer (355 mL), one 5 oz glass of wine (148 mL), or one 1 oz glass of hard liquor (44 mL). Medicines Your health care provider may prescribe medicine if lifestyle changes are not enough to get your blood pressure under control and if: Your systolic blood pressure is 130 or higher. Your diastolic blood pressure is 80 or higher. Take medicines only as told by your health care provider. Follow the directions carefully. Blood pressure medicines must  be taken as told by your health care provider. The medicine does not work as well when you skip doses. Skipping doses also puts you at risk for problems. Monitoring Before you monitor your blood pressure: Do not smoke, drink caffeinated beverages, or exercise within 30 minutes before taking a measurement. Use the bathroom and empty your bladder (urinate). Sit quietly for at least 5 minutes before taking measurements. Monitor your blood pressure at home as told by your health care provider. To do this: Sit with your back straight and supported. Place your feet flat on the floor. Do not cross your legs. Support your arm on a flat surface, such as a table. Make sure your upper arm is at heart level. Each time you measure, take two or three readings one minute apart and record the results. You may also need to have your blood pressure checked regularly by your health care provider. General information Talk with your health care provider about your diet, exercise habits, and other lifestyle factors that may be contributing to hypertension. Review all the medicines you take with your health care provider because there may be side effects or interactions. Keep all follow-up visits. Your health care provider can help you create and adjust your plan for managing your high blood pressure. Where  to find more information National Heart, Lung, and Blood Institute: [employer]is American Heart Association: www.heart.org Contact a health care provider if: You think you are having a reaction to medicines you have taken. You have repeated (recurrent) headaches. You feel dizzy. You have swelling in your ankles. You have trouble with your vision. Get help right away if: You develop a severe headache or confusion. You have unusual weakness or numbness, or you feel faint. You have severe pain in your chest or abdomen. You vomit repeatedly. You have trouble breathing. These symptoms may be an emergency.  Get help right away. Call 911. Do not wait to see if the symptoms will go away. Do not drive yourself to the hospital. Summary Hypertension is when the force of blood pumping through your arteries is too strong. If this condition is not controlled, it may put you at risk for serious complications. Your personal target blood pressure may vary depending on your medical conditions, your age, and other factors. For most people, a normal blood pressure is less than 120/80. Hypertension is managed by lifestyle changes, medicines, or both. Lifestyle changes to help manage hypertension include losing weight, eating a healthy, low-sodium diet, exercising more, stopping smoking, and limiting alcohol. This information is not intended to replace advice given to you by your health care provider. Make sure you discuss any questions you have with your health care provider. Document Revised: 07/23/2021 Document Reviewed: 07/23/2021 Elsevier Patient Education  2024 [employer].

## 2024-06-05 NOTE — Assessment & Plan Note (Signed)
 Slightly elevated today. She has not taken blood pressure medications today. Historically well controlled. She will start to monitor at home and call me with readings so we can adjust accordingly. Continue  amlodipine  5 mg, hydrochlorothiazide  12.5mg , losartan  100mg , toprol  50mg  qd, potassium chloride  10 mEq daily.

## 2024-06-05 NOTE — Progress Notes (Signed)
 Assessment & Plan:  Essential hypertension, benign -     VITAMIN D  25 Hydroxy (Vit-D Deficiency, Fractures) -     Hemoglobin A1c -     CBC with Differential/Platelet -     Comprehensive metabolic panel with GFR -     Lipid panel -     TSH -     amLODIPine  Besylate; Take 1 tablet (5 mg total) by mouth daily.  Dispense: 90 tablet; Refill: 3 -     Metoprolol  Succinate ER; TAKE 1 TABLET BY MOUTH ONCE A DAY IMMEDIATELY FOLLOWING A MEAL  Dispense: 90 tablet; Refill: 3 -     Simvastatin ; Take 1 tablet (20 mg total) by mouth daily.  Dispense: 90 tablet; Refill: 3  Mixed hyperlipidemia -     Simvastatin ; Take 1 tablet (20 mg total) by mouth daily.  Dispense: 90 tablet; Refill: 3  Depression, recurrent (HCC) Assessment & Plan: Chronic, stable.continue  Wellbutrin  300 mg, zoloft  100mg   Orders: -     Sertraline  HCl; Take 1 tablet (100 mg total) by mouth at bedtime.  Dispense: 90 tablet; Refill: 3 -     buPROPion  HCl ER (XL); Take 1 tablet (300 mg total) by mouth daily.  Dispense: 90 tablet; Refill: 3  Encounter for screening mammogram for malignant neoplasm of breast -     3D Screening Mammogram, Left; Future  Hypertension, unspecified type Assessment & Plan: Slightly elevated today. She has not taken blood pressure medications today. Historically well controlled. She will start to monitor at home and call me with readings so we can adjust accordingly. Continue  amlodipine  5 mg, hydrochlorothiazide  12.5mg , losartan  100mg , toprol  50mg  qd, potassium chloride  10 mEq daily.      Return precautions given.   Risks, benefits, and alternatives of the medications and treatment plan prescribed today were discussed, and patient expressed understanding.   Education regarding symptom management and diagnosis given to patient on AVS either electronically or printed.  No follow-ups on file.  [name], FNP  Subjective:    Patient ID: Michelle Clark, female    DOB: 1946/02/24, 78 y.o.    MRN: [id number]  CC: Michelle Clark is a 78 y.o. female who presents today for follow up.   HPI: She feels well today She was rushed to get here and didn't take blood pressure this morning.  She has BP machine at home however hasn't taken recently.  She is compliant with amlodipine  5 mg, hydrochlorothiazide  12.5mg , losartan  100mg , toprol  50mg  qd, potassium chloride  10 mEq daily.  No nsaid, sudafed use.   Denies CP, sob, leg swelling     She feels well on wellbutrin , zoloft . Sleeping well.   Allergies: Fluoxetine  Current Outpatient Medications on File Prior to Visit  Medication Sig Dispense Refill   calcium carbonate (OSCAL) 1500 (600 Ca) MG TABS tablet Take 600 mg of elemental calcium by mouth daily with breakfast.     Cholecalciferol (VITAMIN D ) 2000 units tablet Take 1 tablet by mouth daily.     cyclobenzaprine  (FLEXERIL ) 5 MG tablet TAKE 1 TABLET BY MOUTH EVERY NIGHT AT BEDTIME AS NEEDED FOR MUSCLE SPASMS 30 tablet 0   losartan  (COZAAR ) 100 MG tablet TAKE ONE TABLET BY MOUTH ONCE DAILY 90 tablet 1   Multiple Vitamin (MULTIVITAMIN) tablet Take 1 tablet by mouth daily.     potassium chloride  (KLOR-CON ) 10 MEQ tablet TAKE ONE TABLET BY MOUTH DAILY 90 tablet 3   hydrochlorothiazide  (MICROZIDE ) 12.5 MG capsule TAKE ONE CAPSULE BY MOUTH ONCE  DAILY (Patient not taking: Reported on 06/05/2024) 90 capsule 1   No current facility-administered medications on file prior to visit.    Review of Systems  Constitutional:  Negative for chills and fever.  Respiratory:  Negative for cough.   Cardiovascular:  Negative for chest pain and palpitations.  Gastrointestinal:  Negative for nausea and vomiting.      Objective:    BP (!) 142/92   Pulse 89   Temp 97.6 F (36.4 C) (Oral)   Ht 5' 6 (1.676 m)   Wt 177 lb 12.8 oz (80.6 kg)   SpO2 98%   BMI 28.70 kg/m  BP Readings from Last 3 Encounters:  06/05/24 (!) 142/92  10/03/23 136/86  04/12/23 128/82   Wt Readings from Last 3  Encounters:  06/05/24 177 lb 12.8 oz (80.6 kg)  10/11/23 176 lb (79.8 kg)  10/03/23 176 lb 9.6 oz (80.1 kg)    Physical Exam Vitals reviewed.  Constitutional:      Appearance: She is well-developed.  Eyes:     Conjunctiva/sclera: Conjunctivae normal.  Cardiovascular:     Rate and Rhythm: Normal rate and regular rhythm.     Pulses: Normal pulses.     Heart sounds: Normal heart sounds.  Pulmonary:     Effort: Pulmonary effort is normal.     Breath sounds: Normal breath sounds. No wheezing, rhonchi or rales.  Skin:    General: Skin is warm and dry.  Neurological:     Mental Status: She is alert.  Psychiatric:        Speech: Speech normal.        Behavior: Behavior normal.        Thought Content: Thought content normal.

## 2024-06-05 NOTE — Assessment & Plan Note (Signed)
Chronic, stable.continue  Wellbutrin 300 mg, zoloft 100mg 

## 2024-06-11 ENCOUNTER — Ambulatory Visit: Payer: Self-pay | Admitting: Family

## 2024-07-30 ENCOUNTER — Other Ambulatory Visit: Payer: Self-pay | Admitting: Family

## 2024-07-30 DIAGNOSIS — I10 Essential (primary) hypertension: Secondary | ICD-10-CM

## 2024-08-27 ENCOUNTER — Other Ambulatory Visit: Payer: Self-pay | Admitting: Family

## 2024-09-27 ENCOUNTER — Encounter: Payer: Self-pay | Admitting: Pharmacist

## 2024-09-27 NOTE — Progress Notes (Signed)
 Pharmacy Quality Measure Review  This patient is appearing on a report for being at risk of failing the adherence measure for hypertension (ACEi/ARB) medications this calendar year.   Medication: losartan  100 mg Last fill date: 06/01/24 for 90 day supply  Insurance report was not up to date. No action needed at this time.  New Rx has been sent, medication has been refilled as of 09/26/24. 1 additional 90d refill remaining.

## 2024-10-15 ENCOUNTER — Ambulatory Visit: Payer: 59 | Admitting: *Deleted

## 2024-10-15 VITALS — Ht 66.0 in | Wt 177.0 lb

## 2024-10-15 DIAGNOSIS — Z0000 Encounter for general adult medical examination without abnormal findings: Secondary | ICD-10-CM

## 2024-10-15 NOTE — Patient Instructions (Signed)
 Michelle Clark,  Thank you for taking the time for your Medicare Wellness Visit. I appreciate your continued commitment to your health goals. Please review the care plan we discussed, and feel free to reach out if I can assist you further.  Please note that Annual Wellness Visits do not include a physical exam. Some assessments may be limited, especially if the visit was conducted virtually. If needed, we may recommend an in-person follow-up with your provider.  Ongoing Care Seeing your primary care provider every 3 to 6 months helps us  monitor your health and provide consistent, personalized care.  Remember to update your flu, covid and shingles vaccines.  Referrals If a referral was made during today's visit and you haven't received any updates within two weeks, please contact the referred provider directly to check on the status.  Recommended Screenings:  Health Maintenance  Topic Date Due   Zoster (Shingles) Vaccine (1 of 2) 01/03/1965   COVID-19 Vaccine (3 - Moderna risk series) 03/15/2020   Breast Cancer Screening  03/28/2024   Flu Shot  06/22/2024   Colon Cancer Screening  01/07/2025   Medicare Annual Wellness Visit  10/15/2025   DTaP/Tdap/Td vaccine (2 - Td or Tdap) 09/15/2026   Pneumococcal Vaccine for age over 59  Completed   Osteoporosis screening with Bone Density Scan  Completed   Hepatitis C Screening  Completed   Meningitis B Vaccine  Aged Out       10/11/2023   10:41 AM  Advanced Directives  Does Patient Have a Medical Advance Directive? Yes  Type of Estate Agent of [place];Living will  Does patient want to make changes to medical advance directive? No - Patient declined  Copy of Healthcare Power of Attorney in Chart? Yes - validated most recent copy scanned in chart (See row information)    Vision: Annual vision screenings are recommended for early detection of glaucoma, cataracts, and diabetic retinopathy. These exams can also reveal signs  of chronic conditions such as diabetes and high blood pressure.  Dental: Annual dental screenings help detect early signs of oral cancer, gum disease, and other conditions linked to overall health, including heart disease and diabetes.  Please see the attached documents for additional preventive care recommendations.

## 2024-10-15 NOTE — Progress Notes (Signed)
 Chief Complaint  Patient presents with   Medicare Wellness     Subjective:   Michelle Clark is a 78 y.o. female who presents for a Medicare Annual Wellness Visit.  Allergies (verified) Fluoxetine    History: Past Medical History:  Diagnosis Date   Cancer (HCC) 2001   Right breast, s/p chemotherapy, XRT and mastectomy, Dr. Sheryn   Colon polyps    Hyperlipidemia    Hypertension    Personal history of chemotherapy    Personal history of radiation therapy    Pre-diabetes    Past Surgical History:  Procedure Laterality Date   ABDOMINAL HYSTERECTOMY  1998   cervix intact; for noncancerous reason, fibroids.   APPENDECTOMY     BREAST SURGERY  2002   mastectomy   CATARACT EXTRACTION W/PHACO Right 10/09/2019   Procedure: CATARACT EXTRACTION PHACO AND INTRAOCULAR LENS PLACEMENT (IOC) RIGHT DIABETIC 9.43, 00:53;  Surgeon: Jaye Fallow, MD;  Location: [facility];  Service: Ophthalmology;  Laterality: Right;   CATARACT EXTRACTION W/PHACO Left 08/17/2022   Procedure: CATARACT EXTRACTION PHACO AND INTRAOCULAR LENS PLACEMENT (IOC) LEFT 6.76 00:39.8 ;  Surgeon: Jaye Fallow, MD;  Location: [facility];  Service: Ophthalmology;  Laterality: Left;   CHOLECYSTECTOMY     COLONOSCOPY WITH PROPOFOL  N/A 01/08/2020   Procedure: COLONOSCOPY WITH PROPOFOL ;  Surgeon: Unk Corinn Skiff, MD;  Location: [facility];  Service: Gastroenterology;  Laterality: N/A;   MASTECTOMY Right 2001   chemo, rad   OOPHORECTOMY     POLYPECTOMY  01/08/2020   Procedure: POLYPECTOMY;  Surgeon: Unk Corinn Skiff, MD;  Location: [facility];  Service: Gastroenterology;;   TUBAL LIGATION     Family History  Problem Relation Age of Onset   Arthritis Mother    Stroke Mother 37   Arthritis Sister    Stroke Sister 55       paralyzed left side   Breast cancer Sister 43   Cancer Sister        breast   Heart disease Brother    Asthma Brother    Colon cancer Brother     Lupus Daughter    Lung cancer Son    Breast cancer Cousin        maternal - 85's   Breast cancer Other    Social History   Occupational History   Not on file  Tobacco Use   Smoking status: Former    Current packs/day: 0.00    Types: Cigarettes    Quit date: 04/10/1997    Years since quitting: 27.5   Smokeless tobacco: Never  Vaping Use   Vaping status: Never Used  Substance and Sexual Activity   Alcohol use: Not Currently    Alcohol/week: 1.0 standard drink of alcohol    Types: 1 Glasses of wine per week   Drug use: No   Sexual activity: Not Currently   Tobacco Counseling Counseling given: Not Answered  SDOH Screenings   Food Insecurity: No Food Insecurity (10/15/2024)  Housing: Unknown (10/15/2024)  Transportation Needs: No Transportation Needs (10/15/2024)  Utilities: Not At Risk (10/15/2024)  Alcohol Screen: Low Risk  (10/15/2024)  Depression (PHQ2-9): Low Risk  (10/15/2024)  Financial Resource Strain: Low Risk  (10/15/2024)  Physical Activity: Inactive (10/15/2024)  Social Connections: Moderately Isolated (10/15/2024)  Stress: No Stress Concern Present (10/15/2024)  Tobacco Use: Medium Risk (10/15/2024)  Health Literacy: Adequate Health Literacy (10/15/2024)   See flowsheets for full screening details  Depression Screen PHQ 2 & 9 Depression Scale- Over  the past 2 weeks, how often have you been bothered by any of the following problems? Little interest or pleasure in doing things: 3 Feeling down, depressed, or hopeless (PHQ Adolescent also includes...irritable): 1 (husband just passed away) PHQ-2 Total Score: 4 Trouble falling or staying asleep, or sleeping too much: 0 Feeling tired or having little energy: 0 Poor appetite or overeating (PHQ Adolescent also includes...weight loss): 0 Feeling bad about yourself - or that you are a failure or have let yourself or your family down: 0 Trouble concentrating on things, such as reading the newspaper or watching  television (PHQ Adolescent also includes...like school work): 0 Moving or speaking so slowly that other people could have noticed. Or the opposite - being so fidgety or restless that you have been moving around a lot more than usual: 0 Thoughts that you would be better off dead, or of hurting yourself in some way: 0 PHQ-9 Total Score: 4 If you checked off any problems, how difficult have these problems made it for you to do your work, take care of things at home, or get along with other people?: Not difficult at all     Goals Addressed             This Visit's Progress    Patient Stated       Wants to get through the grief of losing her husband and feel better       Visit info / Clinical Intake: Medicare Wellness Visit Type:: Subsequent Annual Wellness Visit Persons participating in visit:: patient Medicare Wellness Visit Mode:: Telephone If telephone:: video declined Because this visit was a virtual/telehealth visit:: pt reported vitals If Telephone or Video please confirm:: I connected with the patient using audio enabled telemedicine application and verified that I am speaking with the correct person using two identifiers; I discussed the limitations of evaluation and management by telemedicine; The patient expressed understanding and agreed to proceed Patient Location:: Home Provider Location:: Office/Home Information given by:: patient Interpreter Needed?: No Pre-visit prep was completed: yes AWV questionnaire completed by patient prior to visit?: no Living arrangements:: with family/others Patient's Overall Health Status Rating: good Typical amount of pain: none Does pain affect daily life?: no Are you currently prescribed opioids?: no  Dietary Habits and Nutritional Risks How many meals a day?: 2 Eats fruit and vegetables daily?: yes Most meals are obtained by: preparing own meals In the last 2 weeks, have you had any of the following?: none Diabetic::  no  Functional Status Activities of Daily Living (to include ambulation/medication): Independent Ambulation: Independent Medication Administration: Independent Home Management: Independent Manage your own finances?: yes Primary transportation is: family/friends Concerns about vision?: no *vision screening is required for WTM* Concerns about hearing?: no  Fall Screening Falls in the past year?: 1 Number of falls in past year: 0 Was there an injury with Fall?: 0 Fall Risk Category Calculator: 1 Patient Fall Risk Level: Low Fall Risk  Fall Risk Patient at Risk for Falls Due to: History of fall(s); Impaired balance/gait (waking around in the dark) Fall risk Follow up: Falls evaluation completed; Falls prevention discussed  Home and Transportation Safety: All rugs have non-skid backing?: yes All stairs or steps have railings?: yes (has a ramp also) Grab bars in the bathtub or shower?: yes Have non-skid surface in bathtub or shower?: yes Good home lighting?: yes Regular seat belt use?: yes Hospital stays in the last year:: no  Cognitive Assessment Difficulty concentrating, remembering, or making decisions? : yes Will  6CIT or Mini Cog be Completed: yes What month is it?: 0 points Give patient an address phrase to remember (5 components): [address] [place] About what time is it?: 0 points Count backwards from 20 to 1: 0 points Say the months of the year in reverse: 0 points Repeat the address phrase from earlier: 2 points  Advance Directives (For Healthcare) Does Patient Have a Medical Advance Directive?: No Would patient like information on creating a medical advance directive?: No - Patient declined  Reviewed/Updated  Reviewed/Updated: Reviewed All (Medical, Surgical, Family, Medications, Allergies, Care Teams, Patient Goals)        Objective:    Today's Vitals   10/15/24 1301  Weight: 177 lb (80.3 kg)  Height: 5' 6 (1.676 m)   Body mass index is 28.57  kg/m.  Current Medications (verified) Outpatient Encounter Medications as of 10/15/2024  Medication Sig   amLODipine  (NORVASC ) 5 MG tablet Take 1 tablet (5 mg total) by mouth daily.   buPROPion  (WELLBUTRIN  XL) 300 MG 24 hr tablet Take 1 tablet (300 mg total) by mouth daily.   calcium carbonate (OSCAL) 1500 (600 Ca) MG TABS tablet Take 600 mg of elemental calcium by mouth daily with breakfast.   Cholecalciferol (VITAMIN D ) 2000 units tablet Take 1 tablet by mouth daily.   hydrochlorothiazide  (MICROZIDE ) 12.5 MG capsule TAKE ONE CAPSULE BY MOUTH ONCE DAILY   losartan  (COZAAR ) 100 MG tablet TAKE ONE TABLET BY MOUTH ONCE DAILY   metoprolol  succinate (TOPROL -XL) 50 MG 24 hr tablet TAKE 1 TABLET BY MOUTH ONCE A DAY IMMEDIATELY FOLLOWING A MEAL   Multiple Vitamin (MULTIVITAMIN) tablet Take 1 tablet by mouth daily.   potassium chloride  (KLOR-CON ) 10 MEQ tablet TAKE ONE TABLET BY MOUTH DAILY   sertraline  (ZOLOFT ) 100 MG tablet Take 1 tablet (100 mg total) by mouth at bedtime.   simvastatin  (ZOCOR ) 20 MG tablet Take 1 tablet (20 mg total) by mouth daily.   cyclobenzaprine  (FLEXERIL ) 5 MG tablet TAKE 1 TABLET BY MOUTH EVERY NIGHT AT BEDTIME AS NEEDED FOR MUSCLE SPASMS (Patient not taking: Reported on 10/15/2024)   No facility-administered encounter medications on file as of 10/15/2024.   Hearing/Vision screen Hearing Screening - Comments:: No issues Vision Screening - Comments:: Readers, [facility], up to date Immunizations and Health Maintenance Health Maintenance  Topic Date Due   Zoster Vaccines- Shingrix (1 of 2) 01/03/1965   COVID-19 Vaccine (3 - Moderna risk series) 03/15/2020   Mammogram  03/28/2024   Influenza Vaccine  06/22/2024   Colonoscopy  01/07/2025   Medicare Annual Wellness (AWV)  10/15/2025   DTaP/Tdap/Td (2 - Td or Tdap) 09/15/2026   Pneumococcal Vaccine: 50+ Years  Completed   Bone Density Scan  Completed   Hepatitis C Screening  Completed   Meningococcal B Vaccine   Aged Out        Assessment/Plan:  This is a routine wellness examination for [place].  Patient Care Team: Dineen Rollene MATSU, FNP as PCP - General (Family Medicine) Dennise Capri, MD as Consulting Physician (Nephrology)  I have personally reviewed and noted the following in the patient's chart:   Medical and social history Use of alcohol, tobacco or illicit drugs  Current medications and supplements including opioid prescriptions. Functional ability and status Nutritional status Physical activity Advanced directives List of other physicians Hospitalizations, surgeries, and ER visits in previous 12 months Vitals Screenings to include cognitive, depression, and falls Referrals and appointments  No orders of the defined types were placed in  this encounter.  In addition, I have reviewed and discussed with patient certain preventive protocols, quality metrics, and best practice recommendations. A written personalized care plan for preventive services as well as general preventive health recommendations were provided to patient.   Angeline Fredericks, LPN   88/75/7974   Return in 1 year (on 10/15/2025).  After Visit Summary: (MyChart) Due to this being a telephonic visit, the after visit summary with patients personalized plan was offered to patient via MyChart   Nurse Notes: Discussed the need to update flu, covid and shingles vaccines

## 2024-11-19 ENCOUNTER — Ambulatory Visit
Admission: RE | Admit: 2024-11-19 | Discharge: 2024-11-19 | Disposition: A | Discharge status: Home / Self Care | Source: Ambulatory Visit | Attending: Family | Admitting: Family

## 2024-11-19 DIAGNOSIS — Z1231 Encounter for screening mammogram for malignant neoplasm of breast: Secondary | ICD-10-CM | POA: Insufficient documentation

## 2024-12-06 ENCOUNTER — Ambulatory Visit (INDEPENDENT_AMBULATORY_CARE_PROVIDER_SITE_OTHER): Admitting: Family

## 2024-12-06 ENCOUNTER — Encounter: Payer: Self-pay | Admitting: Family

## 2024-12-06 VITALS — BP 140/80 | HR 89 | Temp 97.9°F | Ht 66.0 in | Wt 176.2 lb

## 2024-12-06 DIAGNOSIS — Z23 Encounter for immunization: Secondary | ICD-10-CM

## 2024-12-06 DIAGNOSIS — F339 Major depressive disorder, recurrent, unspecified: Secondary | ICD-10-CM | POA: Diagnosis not present

## 2024-12-06 DIAGNOSIS — I10 Essential (primary) hypertension: Secondary | ICD-10-CM

## 2024-12-06 DIAGNOSIS — Z0001 Encounter for general adult medical examination with abnormal findings: Secondary | ICD-10-CM

## 2024-12-06 DIAGNOSIS — R7303 Prediabetes: Secondary | ICD-10-CM

## 2024-12-06 DIAGNOSIS — Z0000 Encounter for general adult medical examination without abnormal findings: Secondary | ICD-10-CM

## 2024-12-06 DIAGNOSIS — Z1211 Encounter for screening for malignant neoplasm of colon: Secondary | ICD-10-CM

## 2024-12-06 MED ORDER — SERTRALINE HCL 25 MG PO TABS: 25.0000 mg | ORAL_TABLET | Freq: Every day | ORAL | 3 refills | Status: AC

## 2024-12-06 NOTE — Assessment & Plan Note (Signed)
 Patient declines breast exam.  Left breast mammogram is up-to-date.  Deferred pelvic exam in the absence of complaints.  She also has a history of hysterectomy.  Encouraged continued exercise.  Referral for colonoscopy has been placed

## 2024-12-06 NOTE — Assessment & Plan Note (Signed)
 Chronic, exacerbated by grief, loss of husband.  Increase Zoloft  to 125 mg daily.  Counseled on Zoloft  greater than 100 mg may not be more effective necessarily.  She has felt comfortable with Zoloft  and inclined to increase this medication first.  If Zoloft  125 mg does not result in therapeutic benefit, advised would likely change to another SSRI.  Continue Wellbutrin  300 mg.

## 2024-12-06 NOTE — Patient Instructions (Addendum)
 Referral to gastroenterology for colonscopy   INcrease zoloft  to 125mg  total  Please let me know how you are doing.   It is imperative that you are seen AT least twice per year for labs and monitoring. Monitor blood pressure at home and me 5-6 reading on separate days. Goal is less than 120/80, based on newest guidelines, however we certainly want to be less than 130/80;  if persistently higher, please make sooner follow up appointment so we can recheck you blood pressure and manage/ adjust medications.   Managing Your Hypertension Hypertension, also called high blood pressure, is when the force of the blood pressing against the walls of the arteries is too strong. Arteries are blood vessels that carry blood from your heart throughout your body. Hypertension forces the heart to work harder to pump blood and may cause the arteries to become narrow or stiff. Understanding blood pressure readings A blood pressure reading includes a higher number over a lower number: The first, or top, number is called the systolic pressure. It is a measure of the pressure in your arteries as your heart beats. The second, or bottom number, is called the diastolic pressure. It is a measure of the pressure in your arteries as the heart relaxes. For most people, a normal blood pressure is below 120/80. Your personal target blood pressure may vary depending on your medical conditions, your age, and other factors. Blood pressure is classified into four stages. Based on your blood pressure reading, your health care provider may use the following stages to determine what type of treatment you need, if any. Systolic pressure and diastolic pressure are measured in a unit called millimeters of mercury (mmHg). Normal Systolic pressure: below 120. Diastolic pressure: below 80. Elevated Systolic pressure: 120-129. Diastolic pressure: below 80. Hypertension stage 1 Systolic pressure: 130-139. Diastolic pressure:  80-89. Hypertension stage 2 Systolic pressure: 140 or above. Diastolic pressure: 90 or above. How can this condition affect me? Managing your hypertension is very important. Over time, hypertension can damage the arteries and decrease blood flow to parts of the body, including the brain, heart, and kidneys. Having untreated or uncontrolled hypertension can lead to: A heart attack. A stroke. A weakened blood vessel (aneurysm). Heart failure. Kidney damage. Eye damage. Memory and concentration problems. Vascular dementia. What actions can I take to manage this condition? Hypertension can be managed by making lifestyle changes and possibly by taking medicines. Your health care provider will help you make a plan to bring your blood pressure within a normal range. You may be referred for counseling on a healthy diet and physical activity. Nutrition  Eat a diet that is high in fiber and potassium, and low in salt (sodium), added sugar, and fat. An example eating plan is called the DASH diet. DASH stands for Dietary Approaches to Stop Hypertension. To eat this way: Eat plenty of fresh fruits and vegetables. Try to fill one-half of your plate at each meal with fruits and vegetables. Eat whole grains, such as whole-wheat pasta, brown rice, or whole-grain bread. Fill about one-fourth of your plate with whole grains. Eat low-fat dairy products. Avoid fatty cuts of meat, processed or cured meats, and poultry with skin. Fill about one-fourth of your plate with lean proteins such as fish, chicken without skin, beans, eggs, and tofu. Avoid pre-made and processed foods. These tend to be higher in sodium, added sugar, and fat. Reduce your daily sodium intake. Many people with hypertension should eat less than 1,500 mg of sodium  a day. Lifestyle  Work with your health care provider to maintain a healthy body weight or to lose weight. Ask what an ideal weight is for you. Get at least 30 minutes of exercise  that causes your heart to beat faster (aerobic exercise) most days of the week. Activities may include walking, swimming, or biking. Include exercise to strengthen your muscles (resistance exercise), such as weight lifting, as part of your weekly exercise routine. Try to do these types of exercises for 30 minutes at least 3 days a week. Do not use any products that contain nicotine or tobacco. These products include cigarettes, chewing tobacco, and vaping devices, such as e-cigarettes. If you need help quitting, ask your health care provider. Control any long-term (chronic) conditions you have, such as high cholesterol or diabetes. Identify your sources of stress and find ways to manage stress. This may include meditation, deep breathing, or making time for fun activities. Alcohol use Do not drink alcohol if: Your health care provider tells you not to drink. You are pregnant, may be pregnant, or are planning to become pregnant. If you drink alcohol: Limit how much you have to: 0-1 drink a day for women. 0-2 drinks a day for men. Know how much alcohol is in your drink. In the [place] one drink equals one 12 oz bottle of beer (355 mL), one 5 oz glass of wine (148 mL), or one 1 oz glass of hard liquor (44 mL). Medicines Your health care provider may prescribe medicine if lifestyle changes are not enough to get your blood pressure under control and if: Your systolic blood pressure is 130 or higher. Your diastolic blood pressure is 80 or higher. Take medicines only as told by your health care provider. Follow the directions carefully. Blood pressure medicines must be taken as told by your health care provider. The medicine does not work as well when you skip doses. Skipping doses also puts you at risk for problems. Monitoring Before you monitor your blood pressure: Do not smoke, drink caffeinated beverages, or exercise within 30 minutes before taking a measurement. Use the bathroom and empty your  bladder (urinate). Sit quietly for at least 5 minutes before taking measurements. Monitor your blood pressure at home as told by your health care provider. To do this: Sit with your back straight and supported. Place your feet flat on the floor. Do not cross your legs. Support your arm on a flat surface, such as a table. Make sure your upper arm is at heart level. Each time you measure, take two or three readings one minute apart and record the results. You may also need to have your blood pressure checked regularly by your health care provider. General information Talk with your health care provider about your diet, exercise habits, and other lifestyle factors that may be contributing to hypertension. Review all the medicines you take with your health care provider because there may be side effects or interactions. Keep all follow-up visits. Your health care provider can help you create and adjust your plan for managing your high blood pressure. Where to find more information National Heart, Lung, and Blood Institute: popsteam.is American Heart Association: www.heart.org Contact a health care provider if: You think you are having a reaction to medicines you have taken. You have repeated (recurrent) headaches. You feel dizzy. You have swelling in your ankles. You have trouble with your vision. Get help right away if: You develop a severe headache or confusion. You have unusual weakness or numbness,  or you feel faint. You have severe pain in your chest or abdomen. You vomit repeatedly. You have trouble breathing. These symptoms may be an emergency. Get help right away. Call 911. Do not wait to see if the symptoms will go away. Do not drive yourself to the hospital. Summary Hypertension is when the force of blood pumping through your arteries is too strong. If this condition is not controlled, it may put you at risk for serious complications. Your personal target blood pressure  may vary depending on your medical conditions, your age, and other factors. For most people, a normal blood pressure is less than 120/80. Hypertension is managed by lifestyle changes, medicines, or both. Lifestyle changes to help manage hypertension include losing weight, eating a healthy, low-sodium diet, exercising more, stopping smoking, and limiting alcohol. This information is not intended to replace advice given to you by your health care provider. Make sure you discuss any questions you have with your health care provider. Document Revised: 07/23/2021 Document Reviewed: 07/23/2021 Elsevier Patient Education  2024 [employer].

## 2024-12-06 NOTE — Assessment & Plan Note (Signed)
 Slightly elevated today. She has not taken metoprolol  50 mg yet today.   She will start to monitor at home and call me with readings so we can adjust accordingly. Continue  amlodipine  5 mg, hydrochlorothiazide  12.5mg , losartan  100mg , toprol  50mg  qd, potassium chloride  10 mEq daily.  Offered a follow-up appointment with me in the next couple days, she politely declined.  She would like to monitor blood pressure readings at home

## 2024-12-06 NOTE — Progress Notes (Signed)
 "  Assessment & Plan:  Routine health maintenance Assessment & Plan: Patient declines breast exam.  Left breast mammogram is up-to-date.  Deferred pelvic exam in the absence of complaints.  She also has a history of hysterectomy.  Encouraged continued exercise.  Referral for colonoscopy has been placed   Hypertension, unspecified type Assessment & Plan: Slightly elevated today. She has not taken metoprolol  50 mg yet today.   She will start to monitor at home and call me with readings so we can adjust accordingly. Continue  amlodipine  5 mg, hydrochlorothiazide  12.5mg , losartan  100mg , toprol  50mg  qd, potassium chloride  10 mEq daily.  Offered a follow-up appointment with me in the next couple days, she politely declined.  She would like to monitor blood pressure readings at home  Orders: -     Comprehensive metabolic panel with GFR  Prediabetes -     Hemoglobin A1c  Depression, recurrent Assessment & Plan: Chronic, exacerbated by grief, loss of husband.  Increase Zoloft  to 125 mg daily.  Counseled on Zoloft  greater than 100 mg may not be more effective necessarily.  She has felt comfortable with Zoloft  and inclined to increase this medication first.  If Zoloft  125 mg does not result in therapeutic benefit, advised would likely change to another SSRI.  Continue Wellbutrin  300 mg.  Orders: -     Sertraline  HCl; Take 1 tablet (25 mg total) by mouth daily.  Dispense: 30 tablet; Refill: 3  Screen for colon cancer -     Ambulatory referral to Gastroenterology     Return precautions given.   Risks, benefits, and alternatives of the medications and treatment plan prescribed today were discussed, and patient expressed understanding.   Education regarding symptom management and diagnosis given to patient on AVS either electronically or printed.  Return in about 6 weeks (around 01/17/2025).  [name], FNP  Subjective:    Patient ID: Michelle Clark, female    DOB: 1946-03-30, 79  y.o.   MRN: [id number]  CC: Michelle Clark is a 79 y.o. female who presents today for physical exam, depression, and HTN.    HPI: HPI Discussed the use of AI scribe software for clinical note transcription with the patient, who gave verbal consent to proceed.  History of Present Illness   Michelle Clark is a 79 year old female with depression and hypertension who presents for a follow-up visit and annual physical exam.  She is experiencing grief following the death of her husband in 09/27/2025, which has been emotionally challenging. The grief is improving over time, and she prefers to rely on family support rather than joining a grief support group. She is currently on Zoloft  100 mg daily and Wellbutrin  300mg  every day. Denies anxiety .  She is on a regimen of amlodipine  5 mg, hydrochlorothiazide  12.5 mg, losartan  100 mg, and metoprolol  50 mg for hypertension. She mentions that she took all her medications except for metoprolol  today, which she takes with food. She has a blood pressure machine at home. No chest pain is reported.  She has been walking a little for exercise.    Colorectal Cancer Screening: due  Breast Cancer Screening: left breast Mammogram UTD Cervical Cancer Screening: History of hysterectomy ; cervix intact; for noncancerous reason, fibroids.  Bone Health screening/DEXA for 65+: due 04/2025  Lung Cancer Screening: Doesn't have 20 year pack year history and age > 50 years yo 25 years        Tetanus - UTD  Exercise: Gets regular exercise.   Alcohol use:  none Smoking/tobacco use: former smoker.    Health Maintenance  Topic Date Due   COVID-19 Vaccine (3 - Moderna risk series) 03/15/2020   Colon Cancer Screening  01/07/2025   Flu Shot  02/19/2025*   Zoster (Shingles) Vaccine (1 of 2) 03/06/2025*   Medicare Annual Wellness Visit  10/15/2025   Breast Cancer Screening  11/19/2025   DTaP/Tdap/Td vaccine (2 - Td or Tdap) 09/15/2026   Pneumococcal Vaccine for age  over 25  Completed   Osteoporosis screening with Bone Density Scan  Completed   Hepatitis C Screening  Completed   Meningitis B Vaccine  Aged Out  *Topic was postponed. The date shown is not the original due date.    ALLERGIES: Fluoxetine   Medications Ordered Prior to Encounter[1]  Review of Systems  Constitutional:  Negative for chills and fever.  Respiratory:  Negative for cough.   Cardiovascular:  Negative for chest pain and palpitations.  Gastrointestinal:  Negative for nausea and vomiting.      Objective:    BP (!) 140/80   Pulse 89   Temp 97.9 F (36.6 C) (Oral)   Ht 5' 6 (1.676 m)   Wt 176 lb 3.2 oz (79.9 kg)   SpO2 99%   BMI 28.44 kg/m   BP Readings from Last 3 Encounters:  12/06/24 (!) 140/80  06/05/24 (!) 142/92  10/03/23 136/86   Wt Readings from Last 3 Encounters:  12/06/24 176 lb 3.2 oz (79.9 kg)  10/15/24 177 lb (80.3 kg)  06/05/24 177 lb 12.8 oz (80.6 kg)    Physical Exam Vitals reviewed.  Constitutional:      Appearance: She is well-developed.  Eyes:     Conjunctiva/sclera: Conjunctivae normal.  Neck:     Thyroid : No thyroid  mass or thyromegaly.  Cardiovascular:     Rate and Rhythm: Normal rate and regular rhythm.     Pulses: Normal pulses.     Heart sounds: Normal heart sounds.  Pulmonary:     Effort: Pulmonary effort is normal.     Breath sounds: Normal breath sounds. No wheezing, rhonchi or rales.  Lymphadenopathy:     Head:     Right side of head: No submental, submandibular, tonsillar, preauricular, posterior auricular or occipital adenopathy.     Left side of head: No submental, submandibular, tonsillar, preauricular, posterior auricular or occipital adenopathy.     Cervical: No cervical adenopathy.  Skin:    General: Skin is warm and dry.  Neurological:     Mental Status: She is alert.  Psychiatric:        Speech: Speech normal.        Behavior: Behavior normal.        Thought Content: Thought content normal.            [1]  Current Outpatient Medications on File Prior to Visit  Medication Sig Dispense Refill   amLODipine  (NORVASC ) 5 MG tablet Take 1 tablet (5 mg total) by mouth daily. 90 tablet 3   buPROPion  (WELLBUTRIN  XL) 300 MG 24 hr tablet Take 1 tablet (300 mg total) by mouth daily. 90 tablet 3   calcium carbonate (OSCAL) 1500 (600 Ca) MG TABS tablet Take 600 mg of elemental calcium by mouth daily with breakfast.     Cholecalciferol (VITAMIN D ) 2000 units tablet Take 1 tablet by mouth daily.     hydrochlorothiazide  (MICROZIDE ) 12.5 MG capsule TAKE ONE CAPSULE BY MOUTH ONCE DAILY 90 capsule 1  losartan  (COZAAR ) 100 MG tablet TAKE ONE TABLET BY MOUTH ONCE DAILY 90 tablet 1   metoprolol  succinate (TOPROL -XL) 50 MG 24 hr tablet TAKE 1 TABLET BY MOUTH ONCE A DAY IMMEDIATELY FOLLOWING A MEAL 90 tablet 3   Multiple Vitamin (MULTIVITAMIN) tablet Take 1 tablet by mouth daily.     potassium chloride  (KLOR-CON ) 10 MEQ tablet TAKE ONE TABLET BY MOUTH DAILY 90 tablet 3   sertraline  (ZOLOFT ) 100 MG tablet Take 1 tablet (100 mg total) by mouth at bedtime. 90 tablet 3   simvastatin  (ZOCOR ) 20 MG tablet Take 1 tablet (20 mg total) by mouth daily. 90 tablet 3   cyclobenzaprine  (FLEXERIL ) 5 MG tablet TAKE 1 TABLET BY MOUTH EVERY NIGHT AT BEDTIME AS NEEDED FOR MUSCLE SPASMS (Patient not taking: Reported on 12/06/2024) 30 tablet 0   No current facility-administered medications on file prior to visit.   "

## 2024-12-07 ENCOUNTER — Ambulatory Visit: Payer: Self-pay | Admitting: Family

## 2024-12-07 ENCOUNTER — Other Ambulatory Visit: Payer: Self-pay

## 2024-12-07 DIAGNOSIS — R899 Unspecified abnormal finding in specimens from other organs, systems and tissues: Secondary | ICD-10-CM

## 2024-12-07 LAB — COMPREHENSIVE METABOLIC PANEL WITH GFR
ALT: 23 U/L (ref 3–35)
AST: 22 U/L (ref 5–37)
Albumin: 4.7 g/dL (ref 3.5–5.2)
Alkaline Phosphatase: 86 U/L (ref 39–117)
BUN: 13 mg/dL (ref 6–23)
CO2: 28 meq/L (ref 19–32)
Calcium: 10.5 mg/dL (ref 8.4–10.5)
Chloride: 103 meq/L (ref 96–112)
Creatinine, Ser: 0.9 mg/dL (ref 0.40–1.20)
GFR: 60.63 mL/min
Glucose, Bld: 75 mg/dL (ref 70–99)
Potassium: 3.3 meq/L — ABNORMAL LOW (ref 3.5–5.1)
Sodium: 139 meq/L (ref 135–145)
Total Bilirubin: 0.6 mg/dL (ref 0.2–1.2)
Total Protein: 7.8 g/dL (ref 6.0–8.3)

## 2024-12-07 LAB — HEMOGLOBIN A1C: Hgb A1c MFr Bld: 5.6 % (ref 4.6–6.5)

## 2024-12-14 NOTE — Addendum Note (Signed)
 Addended by: Pamila Mendibles on: 12/14/2024 02:59 PM   Modules accepted: Orders

## 2024-12-17 ENCOUNTER — Other Ambulatory Visit: Payer: Self-pay | Admitting: Family

## 2024-12-17 MED ORDER — POTASSIUM CHLORIDE ER 10 MEQ PO TBCR: 10.0000 meq | EXTENDED_RELEASE_TABLET | Freq: Two times a day (BID) | ORAL | 3 refills | Status: AC

## 2024-12-25 ENCOUNTER — Other Ambulatory Visit

## 2025-01-17 ENCOUNTER — Ambulatory Visit: Admitting: Family

## 2025-10-21 ENCOUNTER — Ambulatory Visit
